# Patient Record
Sex: Female | Born: 1960 | Race: White | Hispanic: No | Marital: Single | Smoking: Former smoker
Health system: Southern US, Community
[De-identification: ages and names within clinical notes are randomized; demographics above are authoritative.]

## PROBLEM LIST (undated history)

## (undated) DIAGNOSIS — F3281 Premenstrual dysphoric disorder: Secondary | ICD-10-CM

## (undated) DIAGNOSIS — E079 Disorder of thyroid, unspecified: Secondary | ICD-10-CM

## (undated) DIAGNOSIS — D0510 Intraductal carcinoma in situ of unspecified breast: Secondary | ICD-10-CM

## (undated) DIAGNOSIS — B009 Herpesviral infection, unspecified: Secondary | ICD-10-CM

## (undated) DIAGNOSIS — I10 Essential (primary) hypertension: Secondary | ICD-10-CM

## (undated) DIAGNOSIS — F419 Anxiety disorder, unspecified: Secondary | ICD-10-CM

## (undated) DIAGNOSIS — M1990 Unspecified osteoarthritis, unspecified site: Secondary | ICD-10-CM

## (undated) DIAGNOSIS — K219 Gastro-esophageal reflux disease without esophagitis: Secondary | ICD-10-CM

## (undated) DIAGNOSIS — T7840XA Allergy, unspecified, initial encounter: Secondary | ICD-10-CM

## (undated) DIAGNOSIS — R7303 Prediabetes: Secondary | ICD-10-CM

## (undated) DIAGNOSIS — E119 Type 2 diabetes mellitus without complications: Secondary | ICD-10-CM

## (undated) DIAGNOSIS — E669 Obesity, unspecified: Secondary | ICD-10-CM

## (undated) DIAGNOSIS — D649 Anemia, unspecified: Secondary | ICD-10-CM

## (undated) DIAGNOSIS — G4730 Sleep apnea, unspecified: Secondary | ICD-10-CM

## (undated) DIAGNOSIS — G4733 Obstructive sleep apnea (adult) (pediatric): Secondary | ICD-10-CM

## (undated) DIAGNOSIS — E559 Vitamin D deficiency, unspecified: Secondary | ICD-10-CM

## (undated) DIAGNOSIS — C50919 Malignant neoplasm of unspecified site of unspecified female breast: Secondary | ICD-10-CM

## (undated) DIAGNOSIS — E039 Hypothyroidism, unspecified: Secondary | ICD-10-CM

## (undated) DIAGNOSIS — E785 Hyperlipidemia, unspecified: Secondary | ICD-10-CM

## (undated) DIAGNOSIS — K317 Polyp of stomach and duodenum: Secondary | ICD-10-CM

## (undated) HISTORY — DX: Premenstrual dysphoric disorder: F32.81

## (undated) HISTORY — DX: Malignant neoplasm of unspecified site of unspecified female breast: C50.919

## (undated) HISTORY — PX: LAPAROSCOPIC GASTRIC SLEEVE RESECTION: SHX5895

## (undated) HISTORY — DX: Hyperlipidemia, unspecified: E78.5

## (undated) HISTORY — DX: Essential (primary) hypertension: I10

## (undated) HISTORY — PX: KNEE ARTHROSCOPY: SUR90

## (undated) HISTORY — DX: Allergy, unspecified, initial encounter: T78.40XA

## (undated) HISTORY — DX: Disorder of thyroid, unspecified: E07.9

## (undated) HISTORY — PX: BREAST BIOPSY: SHX20

## (undated) HISTORY — DX: Type 2 diabetes mellitus without complications: E11.9

## (undated) HISTORY — DX: Obesity, unspecified: E66.9

## (undated) HISTORY — PX: CHOLECYSTECTOMY: SHX55

## (undated) HISTORY — PX: OTHER SURGICAL HISTORY: SHX169

## (undated) HISTORY — DX: Anxiety disorder, unspecified: F41.9

## (undated) HISTORY — PX: HERNIA REPAIR: SHX51

## (undated) HISTORY — DX: Herpesviral infection, unspecified: B00.9

## (undated) HISTORY — PX: BLADDER SURGERY: SHX569

## (undated) HISTORY — PX: WISDOM TOOTH EXTRACTION: SHX21

---

## 2004-06-10 ENCOUNTER — Ambulatory Visit: Payer: Self-pay | Admitting: Obstetrics and Gynecology

## 2005-03-10 ENCOUNTER — Ambulatory Visit: Payer: Self-pay

## 2005-06-02 ENCOUNTER — Ambulatory Visit: Payer: Self-pay | Admitting: Unknown Physician Specialty

## 2005-07-23 ENCOUNTER — Ambulatory Visit: Payer: Self-pay | Admitting: Obstetrics and Gynecology

## 2006-06-15 DIAGNOSIS — D239 Other benign neoplasm of skin, unspecified: Secondary | ICD-10-CM

## 2006-06-15 HISTORY — DX: Other benign neoplasm of skin, unspecified: D23.9

## 2006-07-27 ENCOUNTER — Ambulatory Visit: Payer: Self-pay | Admitting: Obstetrics and Gynecology

## 2007-02-08 ENCOUNTER — Ambulatory Visit: Payer: Self-pay

## 2007-08-10 ENCOUNTER — Ambulatory Visit: Payer: Self-pay | Admitting: Obstetrics and Gynecology

## 2009-09-25 ENCOUNTER — Ambulatory Visit: Payer: Self-pay | Admitting: Obstetrics and Gynecology

## 2009-10-06 ENCOUNTER — Ambulatory Visit: Payer: Self-pay | Admitting: Obstetrics and Gynecology

## 2009-10-10 ENCOUNTER — Ambulatory Visit: Payer: Self-pay | Admitting: Obstetrics and Gynecology

## 2011-10-04 ENCOUNTER — Ambulatory Visit: Payer: Self-pay | Admitting: Obstetrics and Gynecology

## 2012-02-02 ENCOUNTER — Ambulatory Visit: Payer: Self-pay | Admitting: Unknown Physician Specialty

## 2012-05-05 ENCOUNTER — Ambulatory Visit: Payer: Self-pay | Admitting: Unknown Physician Specialty

## 2012-10-06 ENCOUNTER — Ambulatory Visit: Payer: Self-pay

## 2012-10-23 ENCOUNTER — Ambulatory Visit: Payer: Self-pay

## 2012-12-01 ENCOUNTER — Ambulatory Visit: Payer: Self-pay

## 2012-12-23 ENCOUNTER — Ambulatory Visit: Payer: Self-pay

## 2013-01-17 ENCOUNTER — Ambulatory Visit: Payer: Self-pay | Admitting: Specialist

## 2013-01-22 ENCOUNTER — Ambulatory Visit: Payer: Self-pay

## 2013-02-08 ENCOUNTER — Telehealth: Payer: Self-pay | Admitting: *Deleted

## 2013-02-08 ENCOUNTER — Other Ambulatory Visit: Payer: Self-pay | Admitting: *Deleted

## 2013-02-08 MED ORDER — MELOXICAM 15 MG PO TABS: 15.0000 mg | ORAL_TABLET | Freq: Every day | ORAL | Status: DC

## 2013-02-08 NOTE — Telephone Encounter (Signed)
Fax request from pharmacy [facility] : mobic 15 mg #50 with 3 refills take one tablet by mouth every day

## 2013-02-08 NOTE — Telephone Encounter (Signed)
That will be fine for three additional refills.

## 2013-03-06 ENCOUNTER — Ambulatory Visit: Payer: Self-pay | Admitting: Gastroenterology

## 2013-03-08 LAB — PATHOLOGY REPORT

## 2013-04-12 ENCOUNTER — Other Ambulatory Visit: Payer: Self-pay | Admitting: *Deleted

## 2013-04-12 MED ORDER — MELOXICAM 15 MG PO TABS: 15.0000 mg | ORAL_TABLET | Freq: Every day | ORAL | Status: DC

## 2013-04-12 NOTE — Telephone Encounter (Signed)
Received fax refill request . Refilled meloxicam 15mg  #30 3rfs

## 2013-07-23 DIAGNOSIS — E039 Hypothyroidism, unspecified: Secondary | ICD-10-CM | POA: Insufficient documentation

## 2013-07-23 DIAGNOSIS — E785 Hyperlipidemia, unspecified: Secondary | ICD-10-CM | POA: Insufficient documentation

## 2013-07-23 DIAGNOSIS — I10 Essential (primary) hypertension: Secondary | ICD-10-CM | POA: Insufficient documentation

## 2013-07-23 DIAGNOSIS — E669 Obesity, unspecified: Secondary | ICD-10-CM | POA: Insufficient documentation

## 2013-07-31 ENCOUNTER — Other Ambulatory Visit: Payer: Self-pay | Admitting: *Deleted

## 2013-07-31 MED ORDER — MELOXICAM 15 MG PO TABS: 15.0000 mg | ORAL_TABLET | Freq: Every day | ORAL | Status: DC

## 2013-07-31 NOTE — Telephone Encounter (Signed)
[facility] sent refill request for meloxicam 15 mg #30 with 3 refills. Refill per dr Milinda Pointer.

## 2013-11-01 ENCOUNTER — Ambulatory Visit: Payer: Self-pay

## 2013-11-01 LAB — HM COLONOSCOPY

## 2014-10-09 ENCOUNTER — Ambulatory Visit (INDEPENDENT_AMBULATORY_CARE_PROVIDER_SITE_OTHER): Payer: 59 | Admitting: Podiatry

## 2014-10-09 ENCOUNTER — Ambulatory Visit (INDEPENDENT_AMBULATORY_CARE_PROVIDER_SITE_OTHER): Payer: 59

## 2014-10-09 DIAGNOSIS — M722 Plantar fascial fibromatosis: Secondary | ICD-10-CM

## 2014-10-09 NOTE — Progress Notes (Signed)
She presents today with a chief complaint of pain to the dorsal lateral aspect of her left foot. He states that the plantar fasciitis seems to have resolved over the past several years. She states that the pain to the lateral aspect of the foot is sharp and seems to originate from this joint as she points to the fifth metatarsal cuboid articulation site. States that the pain is sharp in nature and is persistent. She's tried nothing to accommodate this pain.  Objective: Vital signs are stable she is alert and oriented 3. Pulses are strongly palpable. Neurologic sensorium is intact persons once the monofilament. Deep tendon reflexes are intact bilateral muscle strength +5 over 5 dorsiflexion plantar flexors and inverters everters all intrinsic musculature is intact. Orthopedic evaluation of his resolved joints distal to the ankle while a full range of motion. 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  Plan: We discussed the etiology pathology conservative versus surgical therapies. We discussed appropriate shoe gear stretching exercises ice therapy issue modifications. Started her utilizing a plantar fascial brace and a injection with Kenalog and local anesthesia. I will follow-up with her in a month if necessary.

## 2014-10-09 NOTE — Addendum Note (Signed)
Addended by: Clovis Riley E on: 10/09/2014 01:42 PM   Modules accepted: Medications

## 2014-10-31 ENCOUNTER — Telehealth: Payer: Self-pay | Admitting: Unknown Physician Specialty

## 2014-10-31 NOTE — Telephone Encounter (Signed)
Pt came in to get labs done and i advised her to go to a drawing station because she had orders from another provider. She would like to know if she needed to set up an appt to come in to go over labs.

## 2014-10-31 NOTE — Telephone Encounter (Signed)
Yes, she should make an appointment to go over labs.  Thanks.

## 2014-11-04 NOTE — Telephone Encounter (Signed)
Called pt left voicemail to return call and schedule appointment. Will update once appt is made. Thanks.

## 2014-11-05 DIAGNOSIS — B009 Herpesviral infection, unspecified: Secondary | ICD-10-CM

## 2014-11-05 DIAGNOSIS — F3281 Premenstrual dysphoric disorder: Secondary | ICD-10-CM | POA: Insufficient documentation

## 2014-11-05 DIAGNOSIS — F329 Major depressive disorder, single episode, unspecified: Secondary | ICD-10-CM

## 2014-11-05 DIAGNOSIS — F32A Depression, unspecified: Secondary | ICD-10-CM

## 2014-11-05 DIAGNOSIS — E559 Vitamin D deficiency, unspecified: Secondary | ICD-10-CM

## 2014-11-05 DIAGNOSIS — F419 Anxiety disorder, unspecified: Secondary | ICD-10-CM | POA: Insufficient documentation

## 2014-11-05 DIAGNOSIS — J309 Allergic rhinitis, unspecified: Secondary | ICD-10-CM

## 2014-11-05 DIAGNOSIS — E668 Other obesity: Secondary | ICD-10-CM

## 2014-11-05 DIAGNOSIS — E118 Type 2 diabetes mellitus with unspecified complications: Secondary | ICD-10-CM

## 2014-11-05 DIAGNOSIS — F39 Unspecified mood [affective] disorder: Secondary | ICD-10-CM | POA: Insufficient documentation

## 2014-11-05 DIAGNOSIS — E119 Type 2 diabetes mellitus without complications: Secondary | ICD-10-CM | POA: Insufficient documentation

## 2014-11-05 DIAGNOSIS — E669 Obesity, unspecified: Secondary | ICD-10-CM | POA: Insufficient documentation

## 2014-11-05 NOTE — Telephone Encounter (Signed)
Called and left patient a voicemail asking for her to return my call and schedule a follow-up visit.

## 2014-11-05 NOTE — Telephone Encounter (Signed)
I was abstracting charts and noticed that this patient has an appointment scheduled for 11/12/14.

## 2014-11-12 ENCOUNTER — Ambulatory Visit: Payer: Self-pay | Admitting: Unknown Physician Specialty

## 2014-11-13 ENCOUNTER — Ambulatory Visit: Payer: 59 | Admitting: Podiatry

## 2014-11-25 ENCOUNTER — Encounter: Payer: Self-pay | Admitting: Unknown Physician Specialty

## 2014-11-25 ENCOUNTER — Ambulatory Visit (INDEPENDENT_AMBULATORY_CARE_PROVIDER_SITE_OTHER): Payer: 59 | Admitting: Unknown Physician Specialty

## 2014-11-25 ENCOUNTER — Other Ambulatory Visit: Payer: Self-pay | Admitting: Obstetrics and Gynecology

## 2014-11-25 VITALS — BP 117/78 | HR 67 | Temp 98.3°F | Ht 65.3 in | Wt 180.4 lb

## 2014-11-25 DIAGNOSIS — Z23 Encounter for immunization: Secondary | ICD-10-CM | POA: Diagnosis not present

## 2014-11-25 DIAGNOSIS — R7301 Impaired fasting glucose: Secondary | ICD-10-CM | POA: Diagnosis not present

## 2014-11-25 DIAGNOSIS — F39 Unspecified mood [affective] disorder: Secondary | ICD-10-CM | POA: Diagnosis not present

## 2014-11-25 DIAGNOSIS — E785 Hyperlipidemia, unspecified: Secondary | ICD-10-CM

## 2014-11-25 DIAGNOSIS — Z79890 Hormone replacement therapy (postmenopausal): Secondary | ICD-10-CM | POA: Insufficient documentation

## 2014-11-25 DIAGNOSIS — Z1231 Encounter for screening mammogram for malignant neoplasm of breast: Secondary | ICD-10-CM

## 2014-11-25 DIAGNOSIS — E039 Hypothyroidism, unspecified: Secondary | ICD-10-CM | POA: Diagnosis not present

## 2014-11-25 LAB — LIPID PANEL PICCOLO, WAIVED
Chol/HDL Ratio Piccolo,Waive: 3.4 mg/dL
Cholesterol Piccolo, Waived: 278 mg/dL — ABNORMAL HIGH (ref <200)
HDL CHOL PICCOLO, WAIVED: 82 mg/dL (ref >59)
LDL Chol Calc Piccolo Waived: 156 mg/dL — ABNORMAL HIGH (ref <100)
TRIGLYCERIDES PICCOLO,WAIVED: 197 mg/dL — AB (ref <150)
VLDL Chol Calc Piccolo,Waive: 39 mg/dL — ABNORMAL HIGH (ref <30)

## 2014-11-25 NOTE — Assessment & Plan Note (Signed)
LDL is 156.  Not in statin benefit group

## 2014-11-25 NOTE — Patient Instructions (Signed)

## 2014-11-25 NOTE — Progress Notes (Signed)
BP 117/78 mmHg  Pulse 67  Temp(Src) 98.3 F (36.8 C)  Ht 5' 5.3" (1.659 m)  Wt 180 lb 6.4 oz (81.829 kg)  BMI 29.73 kg/m2  SpO2 97%  LMP  (LMP Unknown)   Subjective:    Patient ID: Dominique Kerr, female    DOB: [birth year], 53 y.o.   MRN: [id number]  HPI: Dominique Kerr is a 54 y.o. female  Chief Complaint  Patient presents with  . other    pt states she is here to go over lab work that was done outside of our office at a drawing station   IFG Hgb A1C of 5.8.  She was 6.1 previously.  She "has been a little slack" but getting back into walking and to the gym.    Vitamin D Pt is slightly low at 26.2.  Restarted taking vitamin D3 Supplements.    Hyperlipidemia High last visit.  Did not have that rechecked   Hypothyroid Euthyroid on last lab work on Levothyroxine 175 mcgs.  Denies any problem.   Depression Good control Depression screen PHQ 2/9 11/25/2014  Decreased Interest 0  Down, Depressed, Hopeless 0  PHQ - 2 Score 0      Relevant past medical, surgical, family and social history reviewed and updated as indicated. Interim medical history since our last visit reviewed. Allergies and medications reviewed and updated.  Review of Systems  Per HPI unless specifically indicated above     Objective:    BP 117/78 mmHg  Pulse 67  Temp(Src) 98.3 F (36.8 C)  Ht 5' 5.3" (1.659 m)  Wt 180 lb 6.4 oz (81.829 kg)  BMI 29.73 kg/m2  SpO2 97%  LMP  (LMP Unknown)  Wt Readings from Last 3 Encounters:  11/25/14 180 lb 6.4 oz (81.829 kg)  05/20/14 180 lb (81.647 kg)    Physical Exam  Constitutional: She is oriented to person, place, and time. She appears well-developed and well-nourished. No distress.  HENT:  Head: Normocephalic and atraumatic.  Eyes: Conjunctivae and lids are normal. Right eye exhibits no discharge. Left eye exhibits no discharge. No scleral icterus.  Cardiovascular: Normal rate, regular rhythm and normal heart sounds.    Pulmonary/Chest: Effort normal and breath sounds normal. No respiratory distress.  Abdominal: Normal appearance. There is no splenomegaly or hepatomegaly.  Musculoskeletal: Normal range of motion.  Neurological: She is alert and oriented to person, place, and time.  Skin: Skin is intact. No rash noted. No pallor.  Psychiatric: She has a normal mood and affect. Her behavior is normal. Judgment and thought content normal.       Assessment & Plan:   Problem List Items Addressed This Visit      Unprioritized   HLD (hyperlipidemia)    LDL is 156.  Not in statin benefit group      Relevant Orders   Lipid Panel Piccolo, Waived   Adult hypothyroidism    Euthyroid. Continue present medications.       Mood disorder (HCC)    Good control      IFG (impaired fasting glucose)    Continue with current diet and exercise       Other Visit Diagnoses    Immunization due    -  Primary    Relevant Orders    Flu Vaccine QUAD 36+ mos PF IM (Fluarix & Fluzone Quad PF) (Completed)        Follow up plan: Return in about 1 year (around 11/25/2015).

## 2014-11-25 NOTE — Assessment & Plan Note (Signed)
Continue with current diet and exercise  

## 2014-11-25 NOTE — Assessment & Plan Note (Signed)
Good control

## 2014-11-25 NOTE — Assessment & Plan Note (Signed)
Euthyroid.  Continue present medications 

## 2014-11-28 ENCOUNTER — Ambulatory Visit: Payer: Self-pay

## 2014-12-09 ENCOUNTER — Other Ambulatory Visit: Payer: Self-pay

## 2014-12-09 MED ORDER — LEVOTHYROXINE SODIUM 175 MCG PO TABS
175.0000 ug | ORAL_TABLET | Freq: Every day | ORAL | Status: DC
Start: 2014-12-09 — End: 2015-09-02

## 2014-12-09 NOTE — Telephone Encounter (Signed)
PATIENT: Dominique Kerr DOB: 1960/06/08 LAST VISIT: 11/25/2014  Patient requests synthroid 175 mcg tab

## 2015-01-09 ENCOUNTER — Ambulatory Visit
Admission: RE | Admit: 2015-01-09 | Discharge: 2015-01-09 | Disposition: A | Discharge status: Home / Self Care | Payer: 59 | Source: Ambulatory Visit | Attending: Obstetrics and Gynecology | Admitting: Obstetrics and Gynecology

## 2015-01-09 DIAGNOSIS — Z1231 Encounter for screening mammogram for malignant neoplasm of breast: Secondary | ICD-10-CM | POA: Diagnosis not present

## 2015-01-25 ENCOUNTER — Other Ambulatory Visit: Payer: Self-pay | Admitting: Unknown Physician Specialty

## 2015-02-22 ENCOUNTER — Other Ambulatory Visit: Payer: Self-pay | Admitting: Unknown Physician Specialty

## 2015-08-20 ENCOUNTER — Encounter: Payer: Self-pay | Admitting: Podiatry

## 2015-08-20 ENCOUNTER — Ambulatory Visit (INDEPENDENT_AMBULATORY_CARE_PROVIDER_SITE_OTHER): Payer: 59 | Admitting: Podiatry

## 2015-08-20 DIAGNOSIS — M722 Plantar fascial fibromatosis: Secondary | ICD-10-CM

## 2015-08-20 MED ORDER — MELOXICAM 15 MG PO TABS: 15.0000 mg | ORAL_TABLET | Freq: Every day | ORAL | Status: DC

## 2015-08-20 NOTE — Progress Notes (Signed)
She presents today with chief complaint of painful left heel. She states that she continues to wear her tennis shoes to work and she continues to utilize her plantar fascia brace.  Objective: Vital signs are stable she is alert and oriented 3. Pulses are palpable. She has pain on palpation medial tubercle of the left heel.  Assessment: Chronic intractable plantar fasciitis left heel.  Plan: Injected the heel today with Kenalog and local anesthetic provided her with a prescription for meloxicam 15 mg 1 by mouth daily 30 with 3 refills and I also recommended tissues to be worn at work at all times. No was provided for such today and I will follow-up with her as needed.

## 2015-09-02 ENCOUNTER — Other Ambulatory Visit: Payer: Self-pay | Admitting: Unknown Physician Specialty

## 2015-11-28 ENCOUNTER — Encounter: Payer: Self-pay | Admitting: Unknown Physician Specialty

## 2015-11-28 ENCOUNTER — Ambulatory Visit (INDEPENDENT_AMBULATORY_CARE_PROVIDER_SITE_OTHER): Payer: 59 | Admitting: Unknown Physician Specialty

## 2015-11-28 VITALS — BP 139/80 | HR 67 | Temp 98.4°F | Ht 65.7 in | Wt 197.0 lb

## 2015-11-28 DIAGNOSIS — Z0000 Encounter for general adult medical examination without abnormal findings: Secondary | ICD-10-CM

## 2015-11-28 DIAGNOSIS — E784 Other hyperlipidemia: Secondary | ICD-10-CM | POA: Diagnosis not present

## 2015-11-28 DIAGNOSIS — E559 Vitamin D deficiency, unspecified: Secondary | ICD-10-CM

## 2015-11-28 DIAGNOSIS — R7301 Impaired fasting glucose: Secondary | ICD-10-CM | POA: Diagnosis not present

## 2015-11-28 DIAGNOSIS — E039 Hypothyroidism, unspecified: Secondary | ICD-10-CM

## 2015-11-28 DIAGNOSIS — Z23 Encounter for immunization: Secondary | ICD-10-CM | POA: Diagnosis not present

## 2015-11-28 DIAGNOSIS — E7849 Other hyperlipidemia: Secondary | ICD-10-CM

## 2015-11-28 LAB — BAYER DCA HB A1C WAIVED: HB A1C (BAYER DCA - WAIVED): 5.9 % (ref <7.0)

## 2015-11-28 NOTE — Assessment & Plan Note (Signed)
Check Hgb A1C 

## 2015-11-28 NOTE — Patient Instructions (Signed)

## 2015-11-28 NOTE — Assessment & Plan Note (Signed)
Check cholesterol today 

## 2015-11-28 NOTE — Assessment & Plan Note (Signed)
Monitor TSH 

## 2015-11-28 NOTE — Progress Notes (Signed)
BP 139/80 (BP Location: Left Arm, Patient Position: Sitting, Cuff Size: Large)   Pulse 67   Temp 98.4 F (36.9 C)   Ht 5' 5.7" (1.669 m)   Wt 197 lb (89.4 kg)   LMP  (LMP Unknown)   SpO2 97%   BMI 32.09 kg/m    Subjective:    Patient ID: Dominique Kerr, female    DOB: Jun 02, 1960, 55 y.o.   MRN: [id number]  HPI: Dominique Kerr is a 55 y.o. female  Chief Complaint  Patient presents with  . Annual Exam   Hypothyroid Pt has had some weight gain.  Sometimes dizzy.  Sometimes decreased energy  Depression No real change in mood. Depression screen [facility] 2/9 11/28/2015 11/25/2014  Decreased Interest 0 0  Down, Depressed, Hopeless 0 0  PHQ - 2 Score 0 0  Altered sleeping 0 -  Tired, decreased energy 0 -  Change in appetite 0 -  Feeling bad or failure about yourself  0 -  Trouble concentrating 0 -  Moving slowly or fidgety/restless 0 -  Suicidal thoughts 0 -  PHQ-9 Score 0 -   Social History   Social History  . Marital status: Single    Spouse name: N/A  . Number of children: N/A  . Years of education: N/A   Occupational History  . Not on file.   Social History Main Topics  . Smoking status: Former Research scientist (life sciences)  . Smokeless tobacco: Never Used  . Alcohol use 0.0 oz/week     Comment: on occasion  . Drug use: No  . Sexual activity: No   Other Topics Concern  . Not on file   Social History Narrative  . No narrative on file   Family History  Problem Relation Age of Onset  . Diabetes Mother   . Hepatitis C Mother   . Arthritis Sister   . Diabetes Maternal Grandmother    Past Medical History:  Diagnosis Date  . Allergy   . Anxiety   . Diabetes mellitus without complication ([place])   . HSV (herpes simplex virus) infection   . Hyperlipidemia   . Hypertension   . Obesity   . PMDD (premenstrual dysphoric disorder)   . Thyroid disease    Past Surgical History:  Procedure Laterality Date  . CHOLECYSTECTOMY    . KNEE ARTHROSCOPY    . LAPAROSCOPIC  GASTRIC SLEEVE RESECTION    . parotid gland removal          Relevant past medical, surgical, family and social history reviewed and updated as indicated. Interim medical history since our last visit reviewed. Allergies and medications reviewed and updated.  Review of Systems  Constitutional: Negative.   HENT: Negative.   Respiratory: Negative.   Cardiovascular: Negative.   Gastrointestinal: Negative.   Endocrine: Negative.   Genitourinary: Negative.   Musculoskeletal: Negative.   Allergic/Immunologic: Negative.   Neurological: Positive for dizziness.       Dizzyness comes and goes  Hematological: Negative.   Psychiatric/Behavioral: Negative.     Per HPI unless specifically indicated above     Objective:    BP 139/80 (BP Location: Left Arm, Patient Position: Sitting, Cuff Size: Large)   Pulse 67   Temp 98.4 F (36.9 C)   Ht 5' 5.7" (1.669 m)   Wt 197 lb (89.4 kg)   LMP  (LMP Unknown)   SpO2 97%   BMI 32.09 kg/m   Wt Readings from Last 3 Encounters:  11/28/15 197 lb (89.4 kg)  11/25/14 180 lb 6.4 oz (81.8 kg)  05/20/14 180 lb (81.6 kg)    Physical Exam  Constitutional: She is oriented to person, place, and time. She appears well-developed and well-nourished.  HENT:  Head: Normocephalic and atraumatic.  Eyes: Pupils are equal, round, and reactive to light. Right eye exhibits no discharge. Left eye exhibits no discharge. No scleral icterus.  Neck: Normal range of motion. Neck supple. Carotid bruit is not present. No thyromegaly present.  Cardiovascular: Normal rate, regular rhythm and normal heart sounds.  Exam reveals no gallop and no friction rub.   No murmur heard. Pulmonary/Chest: Effort normal and breath sounds normal. No respiratory distress. She has no wheezes. She has no rales.  Abdominal: Soft. Bowel sounds are normal. There is no tenderness. There is no rebound.  Genitourinary: No breast swelling, tenderness or discharge.  Musculoskeletal: Normal range  of motion.  Lymphadenopathy:    She has no cervical adenopathy.  Neurological: She is alert and oriented to person, place, and time.  Skin: Skin is warm, dry and intact. No rash noted.  Psychiatric: She has a normal mood and affect. Her speech is normal and behavior is normal. Judgment and thought content normal. Cognition and memory are normal.    Results for orders placed or performed in visit on 11/25/14  Lipid Panel Piccolo, [employer]  Result Value Ref Range   Cholesterol Piccolo, Waived 278 (H) <200 mg/dL   HDL Chol Piccolo, Waived 82 >59 mg/dL   Triglycerides Piccolo,Waived 197 (H) <150 mg/dL   Chol/HDL Ratio Piccolo,Waive 3.4 mg/dL   LDL Chol Calc Piccolo Waived 156 (H) <100 mg/dL   VLDL Chol Calc Piccolo,Waive 39 (H) <30 mg/dL      Assessment & Plan:   Problem List Items Addressed This Visit      Unprioritized   Adult hypothyroidism    Monitor TSH      Relevant Orders   TSH   HLD (hyperlipidemia)    Check cholesterol today      Relevant Orders   Lipid Panel w/o Chol/HDL Ratio   IFG (impaired fasting glucose)    Check Hgb A1C      Relevant Orders   Bayer DCA Hb A1c Waived   Vitamin D deficiency   Relevant Orders   VITAMIN D 25 Hydroxy (Vit-D Deficiency, Fractures)    Other Visit Diagnoses    Need for influenza vaccination    -  Primary   Relevant Orders   Flu Vaccine QUAD 36+ mos IM   Routine general medical examination at a health care facility       Relevant Orders   Comprehensive metabolic panel   CBC with Differential/Platelet   VITAMIN D 25 Hydroxy (Vit-D Deficiency, Fractures)       Follow up plan: Return in about 6 months (around 05/28/2016).

## 2015-11-29 LAB — CBC WITH DIFFERENTIAL/PLATELET
BASOS ABS: 0 10*3/uL (ref 0.0–0.2)
Basos: 1 %
EOS (ABSOLUTE): 0.1 10*3/uL (ref 0.0–0.4)
Eos: 2 %
HEMOGLOBIN: 12.3 g/dL (ref 11.1–15.9)
Hematocrit: 38.1 % (ref 34.0–46.6)
IMMATURE GRANS (ABS): 0 10*3/uL (ref 0.0–0.1)
IMMATURE GRANULOCYTES: 0 %
LYMPHS: 34 %
Lymphocytes Absolute: 1.4 10*3/uL (ref 0.7–3.1)
MCH: 27.8 pg (ref 26.6–33.0)
MCHC: 32.3 g/dL (ref 31.5–35.7)
MCV: 86 fL (ref 79–97)
MONOCYTES: 7 %
Monocytes Absolute: 0.3 10*3/uL (ref 0.1–0.9)
NEUTROS PCT: 56 %
Neutrophils Absolute: 2.4 10*3/uL (ref 1.4–7.0)
PLATELETS: 271 10*3/uL (ref 150–379)
RBC: 4.42 x10E6/uL (ref 3.77–5.28)
RDW: 14.2 % (ref 12.3–15.4)
WBC: 4.2 10*3/uL (ref 3.4–10.8)

## 2015-11-29 LAB — LIPID PANEL W/O CHOL/HDL RATIO
CHOLESTEROL TOTAL: 304 mg/dL — AB (ref 100–199)
HDL: 92 mg/dL (ref >39)
LDL Calculated: 191 mg/dL — ABNORMAL HIGH (ref 0–99)
TRIGLYCERIDES: 105 mg/dL (ref 0–149)
VLDL CHOLESTEROL CAL: 21 mg/dL (ref 5–40)

## 2015-11-29 LAB — COMPREHENSIVE METABOLIC PANEL
A/G RATIO: 1.6 (ref 1.2–2.2)
ALT: 16 IU/L (ref 0–32)
AST: 20 IU/L (ref 0–40)
Albumin: 4.3 g/dL (ref 3.5–5.5)
Alkaline Phosphatase: 74 IU/L (ref 39–117)
BILIRUBIN TOTAL: 0.3 mg/dL (ref 0.0–1.2)
BUN/Creatinine Ratio: 21 (ref 9–23)
BUN: 15 mg/dL (ref 6–24)
CHLORIDE: 101 mmol/L (ref 96–106)
CO2: 29 mmol/L (ref 18–29)
Calcium: 9.3 mg/dL (ref 8.7–10.2)
Creatinine, Ser: 0.72 mg/dL (ref 0.57–1.00)
GFR calc Af Amer: 111 mL/min/{1.73_m2} (ref >59)
GFR calc non Af Amer: 96 mL/min/{1.73_m2} (ref >59)
GLUCOSE: 91 mg/dL (ref 65–99)
Globulin, Total: 2.7 g/dL (ref 1.5–4.5)
POTASSIUM: 4.3 mmol/L (ref 3.5–5.2)
Sodium: 141 mmol/L (ref 134–144)
TOTAL PROTEIN: 7 g/dL (ref 6.0–8.5)

## 2015-11-29 LAB — TSH: TSH: 2.14 u[IU]/mL (ref 0.450–4.500)

## 2015-11-29 LAB — VITAMIN D 25 HYDROXY (VIT D DEFICIENCY, FRACTURES): Vit D, 25-Hydroxy: 27.5 ng/mL — ABNORMAL LOW (ref 30.0–100.0)

## 2015-12-01 ENCOUNTER — Telehealth: Payer: Self-pay | Admitting: Unknown Physician Specialty

## 2015-12-01 NOTE — Progress Notes (Signed)
Normal labs.  Pt notified by phone

## 2015-12-01 NOTE — Telephone Encounter (Signed)
Discussed cholesterol results and pt wants to work on lifestyle changes.    [name], can you give her a call and help her with mychart?

## 2015-12-03 ENCOUNTER — Other Ambulatory Visit: Payer: Self-pay | Admitting: Obstetrics and Gynecology

## 2015-12-03 DIAGNOSIS — N6459 Other signs and symptoms in breast: Secondary | ICD-10-CM

## 2015-12-10 ENCOUNTER — Ambulatory Visit
Admission: RE | Admit: 2015-12-10 | Discharge: 2015-12-10 | Disposition: A | Discharge status: Home / Self Care | Payer: 59 | Source: Ambulatory Visit | Attending: Obstetrics and Gynecology | Admitting: Obstetrics and Gynecology

## 2015-12-10 DIAGNOSIS — N6459 Other signs and symptoms in breast: Secondary | ICD-10-CM

## 2016-01-25 ENCOUNTER — Other Ambulatory Visit: Payer: Self-pay | Admitting: Unknown Physician Specialty

## 2016-02-02 ENCOUNTER — Other Ambulatory Visit: Payer: Self-pay | Admitting: Unknown Physician Specialty

## 2016-05-16 ENCOUNTER — Other Ambulatory Visit: Payer: Self-pay | Admitting: Unknown Physician Specialty

## 2016-05-28 ENCOUNTER — Encounter: Payer: Self-pay | Admitting: Unknown Physician Specialty

## 2016-05-28 ENCOUNTER — Ambulatory Visit (INDEPENDENT_AMBULATORY_CARE_PROVIDER_SITE_OTHER): Payer: 59 | Admitting: Unknown Physician Specialty

## 2016-05-28 VITALS — BP 135/78 | HR 69 | Temp 98.5°F | Ht 65.5 in | Wt 190.7 lb

## 2016-05-28 DIAGNOSIS — F329 Major depressive disorder, single episode, unspecified: Secondary | ICD-10-CM

## 2016-05-28 DIAGNOSIS — F32A Depression, unspecified: Secondary | ICD-10-CM

## 2016-05-28 DIAGNOSIS — E039 Hypothyroidism, unspecified: Secondary | ICD-10-CM

## 2016-05-28 DIAGNOSIS — F418 Other specified anxiety disorders: Secondary | ICD-10-CM | POA: Diagnosis not present

## 2016-05-28 DIAGNOSIS — E7800 Pure hypercholesterolemia, unspecified: Secondary | ICD-10-CM | POA: Diagnosis not present

## 2016-05-28 DIAGNOSIS — R7301 Impaired fasting glucose: Secondary | ICD-10-CM | POA: Diagnosis not present

## 2016-05-28 DIAGNOSIS — F419 Anxiety disorder, unspecified: Secondary | ICD-10-CM

## 2016-05-28 LAB — LIPID PANEL PICCOLO, WAIVED
Chol/HDL Ratio Piccolo,Waive: 3.4 mg/dL
Cholesterol Piccolo, Waived: 269 mg/dL — ABNORMAL HIGH (ref <200)
HDL Chol Piccolo, Waived: 80 mg/dL (ref >59)
LDL Chol Calc Piccolo Waived: 167 mg/dL — ABNORMAL HIGH (ref <100)
Triglycerides Piccolo,Waived: 111 mg/dL (ref <150)
VLDL CHOL CALC PICCOLO,WAIVE: 22 mg/dL (ref <30)

## 2016-05-28 LAB — GLUCOSE HEMOCUE WAIVED: GLU HEMOCUE WAIVED: 83 mg/dL (ref 65–99)

## 2016-05-28 NOTE — Progress Notes (Signed)
BP 135/78 (BP Location: Left Arm, Patient Position: Sitting, Cuff Size: Large)   Pulse 69   Temp 98.5 F (36.9 C)   Ht 5' 5.5" (1.664 m)   Wt 190 lb 11.2 oz (86.5 kg)   SpO2 98%   BMI 31.25 kg/m    Subjective:    Patient ID: Dominique Kerr, female    DOB: 29-Dec-1960, 56 y.o.   MRN: [id number]  HPI: Dominique Kerr is a 56 y.o. female  Chief Complaint  Patient presents with  . Hypothyroidism  . Depression   Hypercholesterolemia Pt states she started back at the gym this week.  She joined [employer] watcher andhas lost some weight since last visit.     Hypothyroid Euthyroid last visit.     GERD Reflux is stable on present treatment  Depression Doing well with present treatment of Sertraline 100 mg daily Depression screen [facility] 2/9 05/28/2016 11/28/2015 11/25/2014  Decreased Interest 0 0 0  Down, Depressed, Hopeless 0 0 0  PHQ - 2 Score 0 0 0  Altered sleeping - 0 -  Tired, decreased energy - 0 -  Change in appetite - 0 -  Feeling bad or failure about yourself  - 0 -  Trouble concentrating - 0 -  Moving slowly or fidgety/restless - 0 -  Suicidal thoughts - 0 -  PHQ-9 Score - 0 -   Relevant past medical, surgical, family and social history reviewed and updated as indicated. Interim medical history since our last visit reviewed. Allergies and medications reviewed and updated.  Review of Systems  Per HPI unless specifically indicated above     Objective:    BP 135/78 (BP Location: Left Arm, Patient Position: Sitting, Cuff Size: Large)   Pulse 69   Temp 98.5 F (36.9 C)   Ht 5' 5.5" (1.664 m)   Wt 190 lb 11.2 oz (86.5 kg)   SpO2 98%   BMI 31.25 kg/m   Wt Readings from Last 3 Encounters:  05/28/16 190 lb 11.2 oz (86.5 kg)  11/28/15 197 lb (89.4 kg)  11/25/14 180 lb 6.4 oz (81.8 kg)    Physical Exam  Constitutional: She is oriented to person, place, and time. She appears well-developed and well-nourished. No distress.  HENT:  Head: Normocephalic and  atraumatic.  Eyes: Conjunctivae and lids are normal. Right eye exhibits no discharge. Left eye exhibits no discharge. No scleral icterus.  Neck: Normal range of motion. Neck supple. No JVD present. Carotid bruit is not present.  Cardiovascular: Normal rate, regular rhythm and normal heart sounds.   Pulmonary/Chest: Effort normal and breath sounds normal.  Abdominal: Normal appearance. There is no splenomegaly or hepatomegaly.  Musculoskeletal: Normal range of motion.  Neurological: She is alert and oriented to person, place, and time.  Skin: Skin is warm, dry and intact. No rash noted. No pallor.  Psychiatric: She has a normal mood and affect. Her behavior is normal. Judgment and thought content normal.     Glucose 83 LDL is 167 Assessment & Plan:   Problem List Items Addressed This Visit      Unprioritized   Adult hypothyroidism    Euthryoid.  Continue present medication      Anxiety and depression    Stable, continue present medications.        HLD (hyperlipidemia)    This is improved with LDL of 167 down from 190.  ASCVD calculator not in statin benefit group.  She will continue with working on diet and exercise  Relevant Orders   Lipid Panel Piccolo, Waived   IFG (impaired fasting glucose) - Primary    Glucose 83      Relevant Orders   Glucose Hemocue Waived       Follow up plan: Return in about 6 months (around 11/27/2016) for physical.

## 2016-05-28 NOTE — Assessment & Plan Note (Signed)
This is improved with LDL of 167 down from 190.  ASCVD calculator not in statin benefit group.  She will continue with working on diet and exercise

## 2016-05-28 NOTE — Assessment & Plan Note (Signed)
Stable, continue present medications.   

## 2016-05-28 NOTE — Assessment & Plan Note (Signed)
Glucose 83

## 2016-05-28 NOTE — Assessment & Plan Note (Signed)
Euthryoid.  Continue present medication

## 2016-12-01 ENCOUNTER — Encounter: Payer: 59 | Admitting: Unknown Physician Specialty

## 2017-01-11 ENCOUNTER — Other Ambulatory Visit: Payer: Self-pay | Admitting: Obstetrics and Gynecology

## 2017-01-11 DIAGNOSIS — Z1231 Encounter for screening mammogram for malignant neoplasm of breast: Secondary | ICD-10-CM

## 2017-01-19 ENCOUNTER — Encounter: Payer: 59 | Admitting: Unknown Physician Specialty

## 2017-01-24 ENCOUNTER — Other Ambulatory Visit: Payer: Self-pay | Admitting: Family Medicine

## 2017-02-06 ENCOUNTER — Other Ambulatory Visit: Payer: Self-pay | Admitting: Unknown Physician Specialty

## 2017-02-07 ENCOUNTER — Telehealth: Payer: Self-pay | Admitting: Unknown Physician Specialty

## 2017-02-07 NOTE — Telephone Encounter (Unsigned)
Copied from [place] [id number]. Topic: Quick Communication - See Telephone Encounter >> Feb 07, 2017  3:51 PM Percell Belt A wrote: CRM for notification. See Telephone encounter for: pt called in and said that pharmacy supposedly has faxed over  sertraline (ZOLOFT) 100 MG tablet [[id number]] 3 times for refill.  I did not see that it was e scribed.  Pt is requesting refill.  [facility] [place]   02/07/17.

## 2017-02-08 NOTE — Telephone Encounter (Signed)
Called [facility], gave verba; for dosage and dose written. Called pt and left message.

## 2017-02-09 ENCOUNTER — Ambulatory Visit
Admission: RE | Admit: 2017-02-09 | Discharge: 2017-02-09 | Disposition: A | Discharge status: Home / Self Care | Payer: 59 | Source: Ambulatory Visit | Attending: Obstetrics and Gynecology | Admitting: Obstetrics and Gynecology

## 2017-02-09 ENCOUNTER — Other Ambulatory Visit: Payer: Self-pay

## 2017-02-09 DIAGNOSIS — Z1231 Encounter for screening mammogram for malignant neoplasm of breast: Secondary | ICD-10-CM | POA: Diagnosis not present

## 2017-02-09 MED ORDER — SERTRALINE HCL 100 MG PO TABS: 100.0000 mg | ORAL_TABLET | Freq: Every day | ORAL | 3 refills | Status: DC

## 2017-02-09 NOTE — Telephone Encounter (Signed)
Patient last seen 05/2016 and no f/up scheduled. Pharmacy states insurance requires 90 day RX.

## 2017-02-09 NOTE — Telephone Encounter (Signed)
Recieved fax from [facility] requesting refill on Sertraline HCL 100 mg tablet quantity 90.

## 2017-05-16 DIAGNOSIS — M25561 Pain in right knee: Secondary | ICD-10-CM | POA: Diagnosis not present

## 2017-05-16 DIAGNOSIS — M1711 Unilateral primary osteoarthritis, right knee: Secondary | ICD-10-CM | POA: Diagnosis not present

## 2017-07-13 ENCOUNTER — Encounter: Payer: Self-pay | Admitting: Unknown Physician Specialty

## 2017-07-13 ENCOUNTER — Ambulatory Visit (INDEPENDENT_AMBULATORY_CARE_PROVIDER_SITE_OTHER): Payer: BLUE CROSS/BLUE SHIELD | Admitting: Unknown Physician Specialty

## 2017-07-13 ENCOUNTER — Encounter

## 2017-07-13 VITALS — BP 125/80 | HR 67 | Temp 97.9°F | Ht 65.7 in | Wt 203.0 lb

## 2017-07-13 DIAGNOSIS — F329 Major depressive disorder, single episode, unspecified: Secondary | ICD-10-CM | POA: Diagnosis not present

## 2017-07-13 DIAGNOSIS — E039 Hypothyroidism, unspecified: Secondary | ICD-10-CM

## 2017-07-13 DIAGNOSIS — E7800 Pure hypercholesterolemia, unspecified: Secondary | ICD-10-CM | POA: Diagnosis not present

## 2017-07-13 DIAGNOSIS — F419 Anxiety disorder, unspecified: Secondary | ICD-10-CM

## 2017-07-13 DIAGNOSIS — Z0000 Encounter for general adult medical examination without abnormal findings: Secondary | ICD-10-CM

## 2017-07-13 DIAGNOSIS — G4733 Obstructive sleep apnea (adult) (pediatric): Secondary | ICD-10-CM | POA: Diagnosis not present

## 2017-07-13 DIAGNOSIS — F32A Depression, unspecified: Secondary | ICD-10-CM

## 2017-07-13 DIAGNOSIS — K219 Gastro-esophageal reflux disease without esophagitis: Secondary | ICD-10-CM | POA: Insufficient documentation

## 2017-07-13 NOTE — Assessment & Plan Note (Signed)
Stable, continue present medications.   

## 2017-07-13 NOTE — Progress Notes (Signed)
BP 125/80   Pulse 67   Temp 97.9 F (36.6 C) (Oral)   Ht 5' 5.7" (1.669 m)   Wt 203 lb (92.1 kg)   SpO2 96%   BMI 33.06 kg/m    Subjective:    Patient ID: Dominique Kerr, female    DOB: [birth year], 57 y.o.   MRN: [id number]  HPI: Dominique Kerr is a 57 y.o. female  Chief Complaint  Patient presents with  . Annual Exam   Depression Pt taking Sertraline 100 mg and doing well.  She does not want to make any changes.   Depression screen [facility] 2/9 07/13/2017 05/28/2016 11/28/2015 11/25/2014  Decreased Interest 0 0 0 0  Down, Depressed, Hopeless 0 0 0 0  PHQ - 2 Score 0 0 0 0  Altered sleeping 0 - 0 -  Tired, decreased energy 0 - 0 -  Change in appetite 0 - 0 -  Feeling bad or failure about yourself  1 - 0 -  Trouble concentrating 0 - 0 -  Moving slowly or fidgety/restless 0 - 0 -  Suicidal thoughts 0 - 0 -  PHQ-9 Score 1 - 0 -    Hypercholesterol Admits to not following diet or exercise.  Pt states she "is back" and is restarting lifestyle efforts  Hypothyroid Pt states no change in energy level.  Some weight gain but feels it's due to life-style factors  GERD Taking daily Omeprazole.  Doing well on present dose without need to change.    Sleep apnea Diagnosed in the past.  Not using CPAP.  Denies problems after losing weight. Wakes up rested, minimal snoring.     Relevant past medical, surgical, family and social history reviewed and updated as indicated. Interim medical history since our last visit reviewed. Allergies and medications reviewed and updated.  Review of Systems  Constitutional: Negative.   HENT: Negative.   Eyes: Negative.   Respiratory: Negative.   Cardiovascular: Negative.   Gastrointestinal: Negative.   Endocrine: Negative.   Genitourinary: Negative.   Musculoskeletal: Negative.   Skin: Negative.   Allergic/Immunologic: Negative.   Neurological: Negative.   Hematological: Negative.   Psychiatric/Behavioral: Negative.     Per HPI  unless specifically indicated above     Objective:    BP 125/80   Pulse 67   Temp 97.9 F (36.6 C) (Oral)   Ht 5' 5.7" (1.669 m)   Wt 203 lb (92.1 kg)   SpO2 96%   BMI 33.06 kg/m   Wt Readings from Last 3 Encounters:  07/13/17 203 lb (92.1 kg)  05/28/16 190 lb 11.2 oz (86.5 kg)  11/28/15 197 lb (89.4 kg)    Physical Exam  Constitutional: She is oriented to person, place, and time. She appears well-developed and well-nourished.  HENT:  Head: Normocephalic and atraumatic.  Eyes: Pupils are equal, round, and reactive to light. Right eye exhibits no discharge. Left eye exhibits no discharge. No scleral icterus.  Neck: Normal range of motion. Neck supple. Carotid bruit is not present. No thyromegaly present.  Cardiovascular: Normal rate, regular rhythm and normal heart sounds. Exam reveals no gallop and no friction rub.  No murmur heard. Pulmonary/Chest: Effort normal and breath sounds normal. No respiratory distress. She has no wheezes. She has no rales. There is breast discharge.  Abdominal: Soft. Bowel sounds are normal. There is no tenderness. There is no rebound.  Genitourinary: No breast tenderness or discharge.  Musculoskeletal: Normal range of motion.  Lymphadenopathy:  She has no cervical adenopathy.  Neurological: She is alert and oriented to person, place, and time.  Skin: Skin is warm, dry and intact. No rash noted.  Psychiatric: She has a normal mood and affect. Her speech is normal and behavior is normal. Judgment and thought content normal. Cognition and memory are normal.    Results for orders placed or performed in visit on 05/28/16  Lipid Panel Piccolo, [employer]  Result Value Ref Range   Cholesterol Piccolo, Waived 269 (H) <200 mg/dL   HDL Chol Piccolo, Waived 80 >59 mg/dL   Triglycerides Piccolo,Waived 111 <150 mg/dL   Chol/HDL Ratio Piccolo,Waive 3.4 mg/dL   LDL Chol Calc Piccolo Waived 167 (H) <100 mg/dL   VLDL Chol Calc Piccolo,Waive 22 <30 mg/dL    Glucose Hemocue Waived  Result Value Ref Range   Glu Hemocue Waived 83 65 - 99 mg/dL      Assessment & Plan:   Problem List Items Addressed This Visit      Unprioritized   Adult hypothyroidism    Check thyroid panel today.  Continue current medication if no change      Relevant Orders   Thyroid Panel With TSH   Anxiety and depression    In remission on current medication.  Continue Sertraline      GERD (gastroesophageal reflux disease)    Stable, continue present medications.        HLD (hyperlipidemia)    Check lipid panel      Relevant Orders   Lipid Panel w/o Chol/HDL Ratio   Obstructive sleep apnea of adult    Diet controlled as improved following weight loss.  Discussed signs of sleep apnea which she denies       Other Visit Diagnoses    Routine general medical examination at a health care facility    -  Primary   Relevant Orders   CBC with Differential/Platelet   Comprehensive metabolic panel   HIV antibody   Hepatitis C antibody      HM: Pap and breast exams through gyn Updated Hepatitis C and HIV  Follow up plan: Return in about 6 months (around 01/13/2018).

## 2017-07-13 NOTE — Assessment & Plan Note (Signed)
Check thyroid panel today.  Continue current medication if no change

## 2017-07-13 NOTE — Assessment & Plan Note (Signed)
In remission on current medication.  Continue Sertraline

## 2017-07-13 NOTE — Assessment & Plan Note (Signed)
Check lipid panel  

## 2017-07-13 NOTE — Assessment & Plan Note (Signed)
Diet controlled as improved following weight loss.  Discussed signs of sleep apnea which she denies

## 2017-07-13 NOTE — Patient Instructions (Signed)
Preventive Care 40-64 Years, Female Preventive care refers to lifestyle choices and visits with your health care provider that can promote health and wellness. What does preventive care include?  A yearly physical exam. This is also called an annual well check.  Dental exams once or twice a year.  Routine eye exams. Ask your health care provider how often you should have your eyes checked.  Personal lifestyle choices, including: ? Daily care of your teeth and gums. ? Regular physical activity. ? Eating a healthy diet. ? Avoiding tobacco and drug use. ? Limiting alcohol use. ? Practicing safe sex. ? Taking low-dose aspirin daily starting at age 58. ? Taking vitamin and mineral supplements as recommended by your health care provider. What happens during an annual well check? The services and screenings done by your health care provider during your annual well check will depend on your age, overall health, lifestyle risk factors, and family history of disease. Counseling Your health care provider may ask you questions about your:  Alcohol use.  Tobacco use.  Drug use.  Emotional well-being.  Home and relationship well-being.  Sexual activity.  Eating habits.  Work and work Statistician.  Method of birth control.  Menstrual cycle.  Pregnancy history.  Screening You may have the following tests or measurements:  Height, weight, and BMI.  Blood pressure.  Lipid and cholesterol levels. These may be checked every 5 years, or more frequently if you are over 81 years old.  Skin check.  Lung cancer screening. You may have this screening every year starting at age 78 if you have a 30-pack-year history of smoking and currently smoke or have quit within the past 15 years.  Fecal occult blood test (FOBT) of the stool. You may have this test every year starting at age 65.  Flexible sigmoidoscopy or colonoscopy. You may have a sigmoidoscopy every 5 years or a colonoscopy  every 10 years starting at age 30.  Hepatitis C blood test.  Hepatitis B blood test.  Sexually transmitted disease (STD) testing.  Diabetes screening. This is done by checking your blood sugar (glucose) after you have not eaten for a while (fasting). You may have this done every 1-3 years.  Mammogram. This may be done every 1-2 years. Talk to your health care provider about when you should start having regular mammograms. This may depend on whether you have a family history of breast cancer.  BRCA-related cancer screening. This may be done if you have a family history of breast, ovarian, tubal, or peritoneal cancers.  Pelvic exam and Pap test. This may be done every 3 years starting at age 80. Starting at age 36, this may be done every 5 years if you have a Pap test in combination with an HPV test.  Bone density scan. This is done to screen for osteoporosis. You may have this scan if you are at high risk for osteoporosis.  Discuss your test results, treatment options, and if necessary, the need for more tests with your health care provider. Vaccines Your health care provider may recommend certain vaccines, such as:  Influenza vaccine. This is recommended every year.  Tetanus, diphtheria, and acellular pertussis (Tdap, Td) vaccine. You may need a Td booster every 10 years.  Varicella vaccine. You may need this if you have not been vaccinated.  Zoster vaccine. You may need this after age 5.  Measles, mumps, and rubella (MMR) vaccine. You may need at least one dose of MMR if you were born in  1955 or later. You may also need a second dose.  Pneumococcal 13-valent conjugate (PCV13) vaccine. You may need this if you have certain conditions and were not previously vaccinated.  Pneumococcal polysaccharide (PPSV23) vaccine. You may need one or two doses if you smoke cigarettes or if you have certain conditions.  Meningococcal vaccine. You may need this if you have certain  conditions.  Hepatitis A vaccine. You may need this if you have certain conditions or if you travel or work in places where you may be exposed to hepatitis A.  Hepatitis B vaccine. You may need this if you have certain conditions or if you travel or work in places where you may be exposed to hepatitis B.  Haemophilus influenzae type b (Hib) vaccine. You may need this if you have certain conditions.  Talk to your health care provider about which screenings and vaccines you need and how often you need them. This information is not intended to replace advice given to you by your health care provider. Make sure you discuss any questions you have with your health care provider. Document Released: 03/07/2015 Document Revised: 10/29/2015 Document Reviewed: 12/10/2014 Elsevier Interactive Patient Education  [employer].

## 2017-07-14 ENCOUNTER — Encounter: Payer: Self-pay | Admitting: Unknown Physician Specialty

## 2017-07-14 LAB — CBC WITH DIFFERENTIAL/PLATELET
BASOS ABS: 0 10*3/uL (ref 0.0–0.2)
Basos: 1 %
EOS (ABSOLUTE): 0.2 10*3/uL (ref 0.0–0.4)
EOS: 4 %
HEMATOCRIT: 38.7 % (ref 34.0–46.6)
HEMOGLOBIN: 12.8 g/dL (ref 11.1–15.9)
Immature Grans (Abs): 0 10*3/uL (ref 0.0–0.1)
Immature Granulocytes: 0 %
LYMPHS ABS: 1.2 10*3/uL (ref 0.7–3.1)
LYMPHS: 28 %
MCH: 27.7 pg (ref 26.6–33.0)
MCHC: 33.1 g/dL (ref 31.5–35.7)
MCV: 84 fL (ref 79–97)
MONOCYTES: 7 %
Monocytes Absolute: 0.3 10*3/uL (ref 0.1–0.9)
NEUTROS ABS: 2.7 10*3/uL (ref 1.4–7.0)
Neutrophils: 60 %
Platelets: 290 10*3/uL (ref 150–450)
RBC: 4.62 x10E6/uL (ref 3.77–5.28)
RDW: 14.1 % (ref 12.3–15.4)
WBC: 4.3 10*3/uL (ref 3.4–10.8)

## 2017-07-14 LAB — COMPREHENSIVE METABOLIC PANEL
ALBUMIN: 4.5 g/dL (ref 3.5–5.5)
ALK PHOS: 64 IU/L (ref 39–117)
ALT: 15 IU/L (ref 0–32)
AST: 16 IU/L (ref 0–40)
Albumin/Globulin Ratio: 2.1 (ref 1.2–2.2)
BUN / CREAT RATIO: 18 (ref 9–23)
BUN: 13 mg/dL (ref 6–24)
Bilirubin Total: 0.3 mg/dL (ref 0.0–1.2)
CO2: 26 mmol/L (ref 20–29)
CREATININE: 0.71 mg/dL (ref 0.57–1.00)
Calcium: 9.4 mg/dL (ref 8.7–10.2)
Chloride: 102 mmol/L (ref 96–106)
GFR calc non Af Amer: 97 mL/min/{1.73_m2} (ref >59)
GFR, EST AFRICAN AMERICAN: 112 mL/min/{1.73_m2} (ref >59)
GLUCOSE: 92 mg/dL (ref 65–99)
Globulin, Total: 2.1 g/dL (ref 1.5–4.5)
Potassium: 4.5 mmol/L (ref 3.5–5.2)
SODIUM: 141 mmol/L (ref 134–144)
TOTAL PROTEIN: 6.6 g/dL (ref 6.0–8.5)

## 2017-07-14 LAB — HIV ANTIBODY (ROUTINE TESTING W REFLEX): HIV Screen 4th Generation wRfx: NONREACTIVE

## 2017-07-14 LAB — THYROID PANEL WITH TSH
FREE THYROXINE INDEX: 1.9 (ref 1.2–4.9)
T3 UPTAKE RATIO: 25 % (ref 24–39)
T4 TOTAL: 7.4 ug/dL (ref 4.5–12.0)
TSH: 0.352 u[IU]/mL — AB (ref 0.450–4.500)

## 2017-07-14 LAB — LIPID PANEL W/O CHOL/HDL RATIO
Cholesterol, Total: 278 mg/dL — ABNORMAL HIGH (ref 100–199)
HDL: 70 mg/dL (ref >39)
LDL CALC: 181 mg/dL — AB (ref 0–99)
Triglycerides: 133 mg/dL (ref 0–149)
VLDL CHOLESTEROL CAL: 27 mg/dL (ref 5–40)

## 2017-07-14 LAB — HEPATITIS C ANTIBODY: Hep C Virus Ab: <0.1 s/co ratio (ref 0.0–0.9)

## 2017-07-14 NOTE — Progress Notes (Signed)
Here are your labs.  Your cholesterol is still high, but continued recommendations to make lifestyle changes. Your LDL is above normal.  The LDL is the "lousy" or bad cholesterol.  Over time and in combination with inflammation and other factors, this contributes to plaque which in turn may lead to stroke and/or heart attack down the road.  Sometimes high LDL is primarily genetic, and people might be eating all the right foods but still have high numbers.  Other times, there is room for improvement in one's diet and eating healthier can bring this number down and potentially reduce one's risk of heart attack and/or stroke.   To reduce your LDL, Remember - more fruits and vegetables, more fish, and limitnred meat and dairy products.  More soy, nuts, beans, barley, lentils, oats and plant sterol ester enriched margarine instead of butter.  I also encourage eliminating sugar and processed food.  Remember, shop on the outside of the grocery store and visit your [employer].   If you would like to talk with me about dietary changes plus or minus medications for your cholesterol, please let me know. We should recheck your cholesterol in 3-6 months.  Your TSH is low, which means your thyroid medication may be too high.  I would recommend decreasing the dose and rechecking in 3 months.  Send me a mychart message to let me know you got this note and I will make the changes

## 2017-07-19 ENCOUNTER — Other Ambulatory Visit: Payer: Self-pay | Admitting: Unknown Physician Specialty

## 2017-07-19 DIAGNOSIS — E039 Hypothyroidism, unspecified: Secondary | ICD-10-CM

## 2017-07-19 MED ORDER — LEVOTHYROXINE SODIUM 150 MCG PO TABS: 150.0000 ug | ORAL_TABLET | Freq: Every day | ORAL | 3 refills | Status: DC

## 2017-07-19 NOTE — Progress Notes (Signed)
Please make sure she comes in for a repeat TSH in 2-3 months

## 2017-10-05 DIAGNOSIS — R05 Cough: Secondary | ICD-10-CM | POA: Diagnosis not present

## 2017-10-05 DIAGNOSIS — E782 Mixed hyperlipidemia: Secondary | ICD-10-CM | POA: Diagnosis not present

## 2017-10-05 DIAGNOSIS — J019 Acute sinusitis, unspecified: Secondary | ICD-10-CM | POA: Diagnosis not present

## 2017-10-05 DIAGNOSIS — Z0130 Encounter for examination of blood pressure without abnormal findings: Secondary | ICD-10-CM | POA: Diagnosis not present

## 2018-01-12 ENCOUNTER — Other Ambulatory Visit: Payer: Self-pay | Admitting: Obstetrics and Gynecology

## 2018-01-12 DIAGNOSIS — Z1231 Encounter for screening mammogram for malignant neoplasm of breast: Secondary | ICD-10-CM

## 2018-01-12 DIAGNOSIS — Z01419 Encounter for gynecological examination (general) (routine) without abnormal findings: Secondary | ICD-10-CM | POA: Diagnosis not present

## 2018-01-18 ENCOUNTER — Ambulatory Visit: Payer: BLUE CROSS/BLUE SHIELD | Admitting: Unknown Physician Specialty

## 2018-01-18 ENCOUNTER — Ambulatory Visit: Payer: Self-pay | Admitting: Nurse Practitioner

## 2018-01-20 ENCOUNTER — Other Ambulatory Visit: Payer: Self-pay | Admitting: Family Medicine

## 2018-01-24 ENCOUNTER — Ambulatory Visit (INDEPENDENT_AMBULATORY_CARE_PROVIDER_SITE_OTHER): Payer: BLUE CROSS/BLUE SHIELD | Admitting: Nurse Practitioner

## 2018-01-24 ENCOUNTER — Other Ambulatory Visit: Payer: Self-pay

## 2018-01-24 ENCOUNTER — Encounter: Payer: Self-pay | Admitting: Nurse Practitioner

## 2018-01-24 VITALS — BP 131/86 | HR 66 | Temp 98.2°F | Ht 66.0 in | Wt 211.0 lb

## 2018-01-24 DIAGNOSIS — F32A Depression, unspecified: Secondary | ICD-10-CM

## 2018-01-24 DIAGNOSIS — E039 Hypothyroidism, unspecified: Secondary | ICD-10-CM

## 2018-01-24 DIAGNOSIS — K219 Gastro-esophageal reflux disease without esophagitis: Secondary | ICD-10-CM | POA: Diagnosis not present

## 2018-01-24 DIAGNOSIS — F419 Anxiety disorder, unspecified: Secondary | ICD-10-CM | POA: Diagnosis not present

## 2018-01-24 DIAGNOSIS — Z23 Encounter for immunization: Secondary | ICD-10-CM

## 2018-01-24 DIAGNOSIS — E7800 Pure hypercholesterolemia, unspecified: Secondary | ICD-10-CM

## 2018-01-24 DIAGNOSIS — F329 Major depressive disorder, single episode, unspecified: Secondary | ICD-10-CM

## 2018-01-24 NOTE — Progress Notes (Signed)
BP 131/86   Pulse 66   Temp 98.2 F (36.8 C) (Oral)   Ht 5\' 6"  (1.676 m)   Wt 211 lb (95.7 kg)   SpO2 96%   BMI 34.06 kg/m    Subjective:    Patient ID: Dominique Kerr, female    DOB: [birth year], 57 y.o.   MRN: [id number]  HPI: Dominique Kerr is a 57 y.o. female  Chief Complaint  Patient presents with  . Hypothyroidism    f/u  . Hyperlipidemia  . Anxiety  . Gastroesophageal Reflux   HYPOTHYROIDISM In May dose decreased to 150 MCG d/t TSH 0.352. Thyroid control status:stable Satisfied with current treatment? yes Medication side effects: no Medication compliance: excellent compliance Etiology of hypothyroidism:  Recent dose adjustment:yes Fatigue: no Cold intolerance: no Heat intolerance: no Weight gain: no Weight loss: no Constipation: no Diarrhea/loose stools: no Palpitations: no Lower extremity edema: no Anxiety/depressed mood: no   GERD Occasionally has days where she has heart burn episodes, 1-2 times a week.  Reports it is not unbearable, just uncomfortable.  No food journal. GERD control status: stable  Satisfied with current treatment? yes Heartburn frequency:  Medication side effects: no  Medication compliance: stable Previous GERD medications: Antacid use frequency:   Duration:  Nature:  Location:  Heartburn duration:  Alleviatiating factors:   Aggravating factors:  Dysphagia: no Odynophagia:  no Hematemesis: no Blood in stool: no EGD: no  HYPERLIPIDEMIA Currently not taking medication.  During last labs in May her TCHOL 278 and LDL 181.  She has been trying to focus on diet and exercise. The 10-year ASCVD risk score Mikey Bussing DC Brooke Bonito., et al., 2013) is: 2.3%   Values used to calculate the score:     Age: 86 years     Sex: Female     Is Non-Hispanic African American: No     Diabetic: No     Tobacco smoker: No     Systolic Blood Pressure: 778 mmHg     Is BP treated: No     HDL Cholesterol: 70 mg/dL     Total Cholesterol: 278  mg/dL Chest pain:  no Coronary artery disease:  no Family history CAD:  yes Family history early CAD:  no   ANXIETY/STRESS Duration:controlled Anxious mood: no  Excessive worrying: no Irritability: no  Sweating: no Nausea: no Palpitations:no Hyperventilation: no Panic attacks: no Agoraphobia: no  Obscessions/compulsions: no Depressed mood: no Depression screen [facility] 2/9 01/24/2018 07/13/2017 05/28/2016 11/28/2015 11/25/2014  Decreased Interest 0 0 0 0 0  Down, Depressed, Hopeless 0 0 0 0 0  PHQ - 2 Score 0 0 0 0 0  Altered sleeping 0 0 - 0 -  Tired, decreased energy 1 0 - 0 -  Change in appetite 0 0 - 0 -  Feeling bad or failure about yourself  1 1 - 0 -  Trouble concentrating 0 0 - 0 -  Moving slowly or fidgety/restless 0 0 - 0 -  Suicidal thoughts 0 0 - 0 -  PHQ-9 Score 2 1 - 0 -  Difficult doing work/chores Not difficult at all - - - -   Anhedonia: no Weight changes: no Insomnia: none Hypersomnia: no Fatigue/loss of energy: no Feelings of worthlessness: no Feelings of guilt: no Impaired concentration/indecisiveness: no Suicidal ideations: no  Crying spells: no Recent Stressors/Life Changes: no   Relationship problems: no   Family stress: no     Financial stress: no    Job stress: no  Recent death/loss: no  Relevant past medical, surgical, family and social history reviewed and updated as indicated. Interim medical history since our last visit reviewed. Allergies and medications reviewed and updated.  Review of Systems  Constitutional: Negative for activity change, appetite change and fatigue.  Respiratory: Negative for cough, chest tightness and shortness of breath.   Cardiovascular: Negative for chest pain, palpitations and leg swelling.  Gastrointestinal: Negative for abdominal distention, abdominal pain, constipation, diarrhea, nausea and vomiting.  Endocrine: Negative.   Neurological: Negative for dizziness, numbness and headaches.  Psychiatric/Behavioral:  Negative.     Per HPI unless specifically indicated above     Objective:    BP 131/86   Pulse 66   Temp 98.2 F (36.8 C) (Oral)   Ht 5\' 6"  (1.676 m)   Wt 211 lb (95.7 kg)   SpO2 96%   BMI 34.06 kg/m   Wt Readings from Last 3 Encounters:  01/24/18 211 lb (95.7 kg)  07/13/17 203 lb (92.1 kg)  05/28/16 190 lb 11.2 oz (86.5 kg)    Physical Exam  Constitutional: She is oriented to person, place, and time. She appears well-developed and well-nourished.  HENT:  Head: Normocephalic.  Eyes: Pupils are equal, round, and reactive to light. Conjunctivae and EOM are normal. Right eye exhibits no discharge. Left eye exhibits no discharge.  Neck: Normal range of motion. Neck supple. No JVD present. Carotid bruit is not present. No thyromegaly present.  Cardiovascular: Normal rate, regular rhythm and normal heart sounds.  Pulmonary/Chest: Effort normal and breath sounds normal.  Abdominal: Soft. Bowel sounds are normal.  Lymphadenopathy:    She has no cervical adenopathy.  Neurological: She is alert and oriented to person, place, and time.  Skin: Skin is warm and dry.  Psychiatric: She has a normal mood and affect. Her behavior is normal. Judgment and thought content normal.  Nursing note and vitals reviewed.   Results for orders placed or performed in visit on 07/13/17  CBC with Differential/Platelet  Result Value Ref Range   WBC 4.3 3.4 - 10.8 x10E3/uL   RBC 4.62 3.77 - 5.28 x10E6/uL   Hemoglobin 12.8 11.1 - 15.9 g/dL   Hematocrit 38.7 34.0 - 46.6 %   MCV 84 79 - 97 fL   MCH 27.7 26.6 - 33.0 pg   MCHC 33.1 31.5 - 35.7 g/dL   RDW 14.1 12.3 - 15.4 %   Platelets 290 150 - 450 x10E3/uL   Neutrophils 60 Not Estab. %   Lymphs 28 Not Estab. %   Monocytes 7 Not Estab. %   Eos 4 Not Estab. %   Basos 1 Not Estab. %   Neutrophils Absolute 2.7 1.4 - 7.0 x10E3/uL   Lymphocytes Absolute 1.2 0.7 - 3.1 x10E3/uL   Monocytes Absolute 0.3 0.1 - 0.9 x10E3/uL   EOS (ABSOLUTE) 0.2 0.0 - 0.4  x10E3/uL   Basophils Absolute 0.0 0.0 - 0.2 x10E3/uL   Immature Granulocytes 0 Not Estab. %   Immature Grans (Abs) 0.0 0.0 - 0.1 x10E3/uL  Comprehensive metabolic panel  Result Value Ref Range   Glucose 92 65 - 99 mg/dL   BUN 13 6 - 24 mg/dL   Creatinine, Ser 0.71 0.57 - 1.00 mg/dL   GFR calc non Af Amer 97 >59 mL/min/1.73   GFR calc Af Amer 112 >59 mL/min/1.73   BUN/Creatinine Ratio 18 9 - 23   Sodium 141 134 - 144 mmol/L   Potassium 4.5 3.5 - 5.2 mmol/L   Chloride 102 96 -  106 mmol/L   CO2 26 20 - 29 mmol/L   Calcium 9.4 8.7 - 10.2 mg/dL   Total Protein 6.6 6.0 - 8.5 g/dL   Albumin 4.5 3.5 - 5.5 g/dL   Globulin, Total 2.1 1.5 - 4.5 g/dL   Albumin/Globulin Ratio 2.1 1.2 - 2.2   Bilirubin Total 0.3 0.0 - 1.2 mg/dL   Alkaline Phosphatase 64 39 - 117 IU/L   AST 16 0 - 40 IU/L   ALT 15 0 - 32 IU/L  Lipid Panel w/o Chol/HDL Ratio  Result Value Ref Range   Cholesterol, Total 278 (H) 100 - 199 mg/dL   Triglycerides 133 0 - 149 mg/dL   HDL 70 >39 mg/dL   VLDL Cholesterol Cal 27 5 - 40 mg/dL   LDL Calculated 181 (H) 0 - 99 mg/dL  Thyroid Panel With TSH  Result Value Ref Range   TSH 0.352 (L) 0.450 - 4.500 uIU/mL   T4, Total 7.4 4.5 - 12.0 ug/dL   T3 Uptake Ratio 25 24 - 39 %   Free Thyroxine Index 1.9 1.2 - 4.9  HIV antibody  Result Value Ref Range   HIV Screen 4th Generation wRfx Non Reactive Non Reactive  Hepatitis C antibody  Result Value Ref Range   Hep C Virus Ab <0.1 0.0 - 0.9 s/co ratio      Assessment & Plan:   Problem List Items Addressed This Visit      Digestive   GERD (gastroesophageal reflux disease)    Stable, continue current med regimen.        Endocrine   Adult hypothyroidism - Primary    Thyroid panel today.  Adjust dose as needed.      Relevant Orders   Thyroid Panel With TSH     Other   HLD (hyperlipidemia)    Check lipid panel today and initiate meds as needed.      Relevant Orders   Lipid Profile   Anxiety and depression    Stable,  continue current med regimen.       Other Visit Diagnoses    Flu vaccine need       Relevant Orders   Flu Vaccine QUAD 36+ mos IM (Completed)       Follow up plan: Return in about 6 months (around 07/26/2018) for HLD/Hypothyroid.

## 2018-01-24 NOTE — Assessment & Plan Note (Signed)
Check lipid panel today and initiate meds as needed.

## 2018-01-24 NOTE — Assessment & Plan Note (Signed)
Stable, continue current med regimen.

## 2018-01-24 NOTE — Patient Instructions (Signed)
Hypothyroidism Hypothyroidism is a disorder of the thyroid. The thyroid is a large gland that is located in the lower front of the neck. The thyroid releases hormones that control how the body works. With hypothyroidism, the thyroid does not make enough of these hormones. What are the causes? Causes of hypothyroidism may include:  Viral infections.  Pregnancy.  Your own defense system (immune system) attacking your thyroid.  Certain medicines.  Birth defects.  Past radiation treatments to your head or neck.  Past treatment with radioactive iodine.  Past surgical removal of part or all of your thyroid.  Problems with the gland that is located in the center of your brain (pituitary).  What are the signs or symptoms? Signs and symptoms of hypothyroidism may include:  Feeling as though you have no energy (lethargy).  Inability to tolerate cold.  Weight gain that is not explained by a change in diet or exercise habits.  Dry skin.  Coarse hair.  Menstrual irregularity.  Slowing of thought processes.  Constipation.  Sadness or depression.  How is this diagnosed? Your health care provider may diagnose hypothyroidism with blood tests and ultrasound tests. How is this treated? Hypothyroidism is treated with medicine that replaces the hormones that your body does not make. After you begin treatment, it may take several weeks for symptoms to go away. Follow these instructions at home:  Take medicines only as directed by your health care provider.  If you start taking any new medicines, tell your health care provider.  Keep all follow-up visits as directed by your health care provider. This is important. As your condition improves, your dosage needs may change. You will need to have blood tests regularly so that your health care provider can watch your condition. Contact a health care provider if:  Your symptoms do not get better with treatment.  You are taking thyroid  replacement medicine and: ? You sweat excessively. ? You have tremors. ? You feel anxious. ? You lose weight rapidly. ? You cannot tolerate heat. ? You have emotional swings. ? You have diarrhea. ? You feel weak. Get help right away if:  You develop chest pain.  You develop an irregular heartbeat.  You develop a rapid heartbeat. This information is not intended to replace advice given to you by your health care provider. Make sure you discuss any questions you have with your health care provider. Document Released: 02/08/2005 Document Revised: 07/17/2015 Document Reviewed: 06/26/2013 Elsevier Interactive Patient Education  2018 [employer] Inc[employer]  

## 2018-01-24 NOTE — Assessment & Plan Note (Signed)
Thyroid panel today.  Adjust dose as needed.

## 2018-01-25 ENCOUNTER — Other Ambulatory Visit: Payer: Self-pay | Admitting: Nurse Practitioner

## 2018-01-25 LAB — THYROID PANEL WITH TSH
FREE THYROXINE INDEX: 1.3 (ref 1.2–4.9)
T3 UPTAKE RATIO: 22 % — AB (ref 24–39)
T4 TOTAL: 6.1 ug/dL (ref 4.5–12.0)
TSH: 7.02 u[IU]/mL — ABNORMAL HIGH (ref 0.450–4.500)

## 2018-01-25 LAB — LIPID PANEL
CHOLESTEROL TOTAL: 284 mg/dL — AB (ref 100–199)
Chol/HDL Ratio: 3.8 ratio (ref 0.0–4.4)
HDL: 74 mg/dL (ref >39)
LDL CALC: 189 mg/dL — AB (ref 0–99)
TRIGLYCERIDES: 104 mg/dL (ref 0–149)
VLDL Cholesterol Cal: 21 mg/dL (ref 5–40)

## 2018-01-25 MED ORDER — LEVOTHYROXINE SODIUM 175 MCG PO TABS: 175.0000 ug | ORAL_TABLET | Freq: Every day | ORAL | 5 refills | Status: DC

## 2018-01-25 NOTE — Progress Notes (Signed)
Labs 01/24/18 showed TSH 7.020 and T3 22.  Patient had dose reduction in May due to TSH 0.352, had been taking 175 MCG and was reduced to 150 MCG.  Have informed patient via MyChart about labs and dose change.  Have advised her to let me know message received.  Also left message, general, via telephone.  Will increase dose to 175 MCG and recheck in 3 months.

## 2018-01-26 ENCOUNTER — Telehealth: Payer: Self-pay | Admitting: Nurse Practitioner

## 2018-01-26 NOTE — Telephone Encounter (Signed)
Would like an office visit and TSH recheck with me.  Thanks.

## 2018-01-26 NOTE — Telephone Encounter (Signed)
Called patient and relayed message Dr. Marnee Guarneri, DNP had sent via mychart,   "Good morning. Cholesterol panel remains elevated with total cholesterol 284 and LDL (bad cholesterol) 189. Current risk ratio remains on low end at 2.3%. Over time and in combination with inflammation and other factors, this contributes to plaque which in turn may lead to stroke and/or heart attack down the road. Sometimes high LDL is primarily genetic, and people might be eating all the right foods but still have high numbers. Other times, there is room for improvement in one's diet and eating healthier can bring this number down and potentially reduce one's risk of heart attack and/or stroke.    To reduce your LDL, Remember - more fruits and vegetables, more fish, and limitnred meat and dairy products. More soy, nuts, beans, barley, lentils, oats and plant sterol ester enriched margarine instead of butter. I also encourage eliminating sugar and processed food. Remember, shop on the outside of the grocery store and visit your [employer]. If you would like to talk with me about dietary changes plus or minus medications for your cholesterol, please let me know. We should recheck your cholesterol in 3-6 months.    Your TSH is high, which means your thyroid medication may be too low. I would recommend increasing the dose and rechecking in 3 months. Send me a mychart message to let me know you got this note and I will make the changes (have sent new Levothyroxine dose to pharmacy, will go up 25 MCG). "  Patient stated she would not need a refill has she has some at home because she had just picked that dosage up when it was changed. She will call back if she needs a refill.

## 2018-01-26 NOTE — Telephone Encounter (Signed)
Did you want her to come back for TSH lab only visit or OV with you?

## 2018-01-26 NOTE — Telephone Encounter (Signed)
Copied from [place] [id number]. Topic: Quick Communication - See Telephone Encounter >> Jan 26, 2018  3:16 PM Rutherford Nail, NT wrote: CRM for notification. See Telephone encounter for: 01/26/18. Patient calling to obtain lab results. Does not remember her mychart password. Would like a call to discuss.

## 2018-02-10 ENCOUNTER — Ambulatory Visit
Admission: RE | Admit: 2018-02-10 | Discharge: 2018-02-10 | Disposition: A | Discharge status: Home / Self Care | Payer: BLUE CROSS/BLUE SHIELD | Source: Ambulatory Visit | Attending: Obstetrics and Gynecology | Admitting: Obstetrics and Gynecology

## 2018-02-10 DIAGNOSIS — Z1231 Encounter for screening mammogram for malignant neoplasm of breast: Secondary | ICD-10-CM | POA: Diagnosis not present

## 2018-03-15 ENCOUNTER — Other Ambulatory Visit: Payer: Self-pay | Admitting: Nurse Practitioner

## 2018-03-15 MED ORDER — LEVOTHYROXINE SODIUM 175 MCG PO TABS: 175.0000 ug | ORAL_TABLET | Freq: Every day | ORAL | 1 refills | Status: DC

## 2018-03-15 NOTE — Telephone Encounter (Signed)
Copied from [place]. Topic: Quick Communication - Rx Refill/Question >> Mar 15, 2018 12:16 PM Burchel, Abbi R wrote: Medication: levothyroxine (SYNTHROID, LEVOTHROID) 175 MCG tablet  Preferred Pharmacy [id number] - [place], [place] [address] [place] [address] Phone: [phone] Fax: [phone]   Pt was advised that RX refills may take up to 3 business days. We ask that you follow-up with your pharmacy.

## 2018-04-21 ENCOUNTER — Other Ambulatory Visit: Payer: Self-pay | Admitting: Unknown Physician Specialty

## 2018-05-01 ENCOUNTER — Ambulatory Visit: Payer: Self-pay | Admitting: Nurse Practitioner

## 2018-05-03 ENCOUNTER — Other Ambulatory Visit: Payer: Self-pay

## 2018-05-03 ENCOUNTER — Ambulatory Visit (INDEPENDENT_AMBULATORY_CARE_PROVIDER_SITE_OTHER): Payer: BLUE CROSS/BLUE SHIELD | Admitting: Nurse Practitioner

## 2018-05-03 ENCOUNTER — Encounter: Payer: Self-pay | Admitting: Nurse Practitioner

## 2018-05-03 VITALS — BP 131/84 | HR 69 | Temp 98.3°F | Ht 66.0 in | Wt 220.0 lb

## 2018-05-03 DIAGNOSIS — E039 Hypothyroidism, unspecified: Secondary | ICD-10-CM

## 2018-05-03 NOTE — Progress Notes (Signed)
BP 131/84   Pulse 69   Temp 98.3 F (36.8 C) (Oral)   Ht 5\' 6"  (1.676 m)   Wt 220 lb (99.8 kg)   SpO2 95%   BMI 35.51 kg/m    Subjective:    Patient ID: Dominique Kerr, female    DOB: May 07, 1960, 57 y.o.   MRN: [id number]  HPI: Dominique Kerr is a 58 y.o. female  Chief Complaint  Patient presents with  . Hypothyroidism    f/u   HYPOTHYROIDISM TSH 01/24/18 7.020 and T4 6.1. Had dose reduction May 2019 due to TSH 0.352, had been on 175 MCG and switched to 150 MCG.  Dose increased during last visit to 175 MCG.   Thyroid control status:stable Satisfied with current treatment? yes Medication side effects: no Medication compliance: good compliance Etiology of hypothyroidism:  Recent dose adjustment:In December 2019 (3 months ago) Fatigue: no Cold intolerance: no Heat intolerance: no Weight gain: yes Weight loss: no Constipation: no Diarrhea/loose stools: no Palpitations: no Lower extremity edema: yes- at the end of the 8 hour work day Anxiety/depressed mood: no  Relevant past medical, surgical, family and social history reviewed and updated as indicated. Interim medical history since our last visit reviewed. Allergies and medications reviewed and updated.  Review of Systems  Per HPI unless specifically indicated above     Objective:    BP 131/84   Pulse 69   Temp 98.3 F (36.8 C) (Oral)   Ht 5\' 6"  (1.676 m)   Wt 220 lb (99.8 kg)   SpO2 95%   BMI 35.51 kg/m   Wt Readings from Last 3 Encounters:  05/03/18 220 lb (99.8 kg)  01/24/18 211 lb (95.7 kg)  07/13/17 203 lb (92.1 kg)    Physical Exam Vitals signs and nursing note reviewed.  Constitutional:      Appearance: Normal appearance. She is well-developed.  HENT:     Head: Normocephalic.  Eyes:     General:        Right eye: No discharge.        Left eye: No discharge.     Conjunctiva/sclera: Conjunctivae normal.     Pupils: Pupils are equal, round, and reactive to light.  Neck:   Musculoskeletal: Normal range of motion and neck supple.     Thyroid: No thyromegaly.     Vascular: No carotid bruit or JVD.  Cardiovascular:     Rate and Rhythm: Normal rate and regular rhythm.     Pulses: Normal pulses.          Posterior tibial pulses are 2+ on the right side and 2+ on the left side.     Heart sounds: Normal heart sounds, S1 normal and S2 normal.  Pulmonary:     Effort: Pulmonary effort is normal.     Breath sounds: Normal breath sounds.  Abdominal:     General: Bowel sounds are normal.     Palpations: Abdomen is soft.  Lymphadenopathy:     Cervical: No cervical adenopathy.  Skin:    General: Skin is warm and dry.     Capillary Refill: Capillary refill takes less than 2 seconds.  Neurological:     Mental Status: She is alert and oriented to person, place, and time.  Psychiatric:        Behavior: Behavior normal.        Thought Content: Thought content normal.        Judgment: Judgment normal.     Results for  orders placed or performed in visit on 01/24/18  Lipid Profile  Result Value Ref Range   Cholesterol, Total 284 (H) 100 - 199 mg/dL   Triglycerides 104 0 - 149 mg/dL   HDL 74 >39 mg/dL   VLDL Cholesterol Cal 21 5 - 40 mg/dL   LDL Calculated 189 (H) 0 - 99 mg/dL   Chol/HDL Ratio 3.8 0.0 - 4.4 ratio  Thyroid Panel With TSH  Result Value Ref Range   TSH 7.020 (H) 0.450 - 4.500 uIU/mL   T4, Total 6.1 4.5 - 12.0 ug/dL   T3 Uptake Ratio 22 (L) 24 - 39 %   Free Thyroxine Index 1.3 1.2 - 4.9      Assessment & Plan:   Problem List Items Addressed This Visit      Endocrine   Adult hypothyroidism - Primary    Chronic, ongoing. Dose adjustment last visit. Continue Synthroid 175MCG. Labs today- will monitor and adjust dose as needed. Follow-up in 6 months.       Relevant Orders   Thyroid Panel With TSH       Follow up plan: Return in about 6 months (around 11/03/2018) for Hypothyroid.   NOTE WRITTEN BY [employer] DNP STUDENT.  ASSESSMENT AND PLAN OF  CARE REVIEWED WITH STUDENT, AGREE WITH ABOVE FINDINGS AND PLAN.

## 2018-05-03 NOTE — Assessment & Plan Note (Addendum)
Chronic, ongoing. Dose adjustment last visit. Continue Synthroid 175MCG. Labs today- will monitor and adjust dose as needed. Follow-up in 6 months.

## 2018-05-03 NOTE — Patient Instructions (Signed)
Hypothyroidism  Hypothyroidism is when the thyroid gland does not make enough of certain hormones (it is underactive). The thyroid gland is a small gland located in the lower front part of the neck, just in front of the windpipe (trachea). This gland makes hormones that help control how the body uses food for energy (metabolism) as well as how the heart and brain function. These hormones also play a role in keeping your bones strong. When the thyroid is underactive, it produces too little of the hormones thyroxine (T4) and triiodothyronine (T3). What are the causes? This condition may be caused by:  Hashimoto's disease. This is a disease in which the body's disease-fighting system (immune system) attacks the thyroid gland. This is the most common cause.  Viral infections.  Pregnancy.  Certain medicines.  Birth defects.  Past radiation treatments to the head or neck for cancer.  Past treatment with radioactive iodine.  Past exposure to radiation in the environment.  Past surgical removal of part or all of the thyroid.  Problems with a gland in the center of the brain (pituitary gland).  Lack of enough iodine in the diet. What increases the risk? You are more likely to develop this condition if:  You are female.  You have a family history of thyroid conditions.  You use a medicine called lithium.  You take medicines that affect the immune system (immunosuppressants). What are the signs or symptoms? Symptoms of this condition include:  Feeling as though you have no energy (lethargy).  Not being able to tolerate cold.  Weight gain that is not explained by a change in diet or exercise habits.  Lack of appetite.  Dry skin.  Coarse hair.  Menstrual irregularity.  Slowing of thought processes.  Constipation.  Sadness or depression. How is this diagnosed? This condition may be diagnosed based on:  Your symptoms, your medical history, and a physical exam.  Blood  tests. You may also have imaging tests, such as an ultrasound or MRI. How is this treated? This condition is treated with medicine that replaces the thyroid hormones that your body does not make. After you begin treatment, it may take several weeks for symptoms to go away. Follow these instructions at home:  Take over-the-counter and prescription medicines only as told by your health care provider.  If you start taking any new medicines, tell your health care provider.  Keep all follow-up visits as told by your health care provider. This is important. ? As your condition improves, your dosage of thyroid hormone medicine may change. ? You will need to have blood tests regularly so that your health care provider can monitor your condition. Contact a health care provider if:  Your symptoms do not get better with treatment.  You are taking thyroid replacement medicine and you: ? Sweat a lot. ? Have tremors. ? Feel anxious. ? Lose weight rapidly. ? Cannot tolerate heat. ? Have emotional swings. ? Have diarrhea. ? Feel weak. Get help right away if you have:  Chest pain.  An irregular heartbeat.  A rapid heartbeat.  Difficulty breathing. Summary  Hypothyroidism is when the thyroid gland does not make enough of certain hormones (it is underactive).  When the thyroid is underactive, it produces too little of the hormones thyroxine (T4) and triiodothyronine (T3).  The most common cause is Hashimoto's disease, a disease in which the body's disease-fighting system (immune system) attacks the thyroid gland. The condition can also be caused by viral infections, medicine, pregnancy, or past   radiation treatment to the head or neck.  Symptoms may include weight gain, dry skin, constipation, feeling as though you do not have energy, and not being able to tolerate cold.  This condition is treated with medicine to replace the thyroid hormones that your body does not make. This information  is not intended to replace advice given to you by your health care provider. Make sure you discuss any questions you have with your health care provider. Document Released: 02/08/2005 Document Revised: 01/19/2017 Document Reviewed: 01/19/2017 Elsevier Interactive Patient Education  2019 Elsevier Inc.  

## 2018-05-04 LAB — THYROID PANEL WITH TSH
Free Thyroxine Index: 1.9 (ref 1.2–4.9)
T3 Uptake Ratio: 24 % (ref 24–39)
T4, Total: 8 ug/dL (ref 4.5–12.0)
TSH: 0.708 u[IU]/mL (ref 0.450–4.500)

## 2018-07-16 ENCOUNTER — Other Ambulatory Visit: Payer: Self-pay | Admitting: Unknown Physician Specialty

## 2018-07-18 ENCOUNTER — Other Ambulatory Visit: Payer: Self-pay | Admitting: Family Medicine

## 2018-09-14 ENCOUNTER — Other Ambulatory Visit: Payer: Self-pay | Admitting: Nurse Practitioner

## 2018-09-14 NOTE — Telephone Encounter (Signed)
Forwarding medication refill to PCP for review. 

## 2018-10-11 ENCOUNTER — Other Ambulatory Visit: Payer: Self-pay | Admitting: Nurse Practitioner

## 2018-10-12 ENCOUNTER — Other Ambulatory Visit: Payer: Self-pay

## 2018-10-12 DIAGNOSIS — R6889 Other general symptoms and signs: Secondary | ICD-10-CM | POA: Diagnosis not present

## 2018-10-12 DIAGNOSIS — Z20822 Contact with and (suspected) exposure to covid-19: Secondary | ICD-10-CM

## 2018-10-13 LAB — NOVEL CORONAVIRUS, NAA: SARS-CoV-2, NAA: NOT DETECTED

## 2018-10-27 ENCOUNTER — Other Ambulatory Visit: Payer: Self-pay | Admitting: Family Medicine

## 2018-11-03 ENCOUNTER — Other Ambulatory Visit: Payer: Self-pay

## 2018-11-03 ENCOUNTER — Ambulatory Visit (INDEPENDENT_AMBULATORY_CARE_PROVIDER_SITE_OTHER): Payer: BLUE CROSS/BLUE SHIELD | Admitting: Nurse Practitioner

## 2018-11-03 ENCOUNTER — Encounter: Payer: Self-pay | Admitting: Nurse Practitioner

## 2018-11-03 VITALS — BP 138/84 | HR 67 | Temp 98.1°F

## 2018-11-03 DIAGNOSIS — E039 Hypothyroidism, unspecified: Secondary | ICD-10-CM | POA: Diagnosis not present

## 2018-11-03 DIAGNOSIS — F419 Anxiety disorder, unspecified: Secondary | ICD-10-CM

## 2018-11-03 DIAGNOSIS — Z23 Encounter for immunization: Secondary | ICD-10-CM

## 2018-11-03 DIAGNOSIS — F329 Major depressive disorder, single episode, unspecified: Secondary | ICD-10-CM

## 2018-11-03 DIAGNOSIS — F32A Depression, unspecified: Secondary | ICD-10-CM

## 2018-11-03 NOTE — Assessment & Plan Note (Signed)
Chronic, stable on current regimen.  Denies SI/HI.  Continue current medication regimen and adjust as needed,

## 2018-11-03 NOTE — Progress Notes (Signed)
BP 138/84   Pulse 67   Temp 98.1 F (36.7 C) (Oral)   SpO2 97%    Subjective:    Patient ID: Dominique Kerr, female    DOB: [birth year], 58 y.o.   MRN: [id number]  HPI: Dominique Kerr is a 58 y.o. female  Chief Complaint  Patient presents with  . Depression  . Hypothyroidism   HYPOTHYROIDISM Continues on Synthroid 175 MCG daily.  Last TSH 0.708. Thyroid control status:stable Satisfied with current treatment? yes Medication side effects: no Medication compliance: good compliance Etiology of hypothyroidism: unknown Recent dose adjustment:no Fatigue: no Cold intolerance: no Heat intolerance: no Weight gain: no Weight loss: no Constipation: no Diarrhea/loose stools: no Palpitations: no Lower extremity edema: no Anxiety/depressed mood: no   DEPRESSION Mood status: stable Satisfied with current treatment?: yes Symptom severity: mild  Duration of current treatment : chronic Side effects: no Medication compliance: good compliance Psychotherapy/counseling: yes in the past Previous psychiatric medications: Zoloft Depressed mood: no Anxious mood: no Anhedonia: no Significant weight loss or gain: no Insomnia: none Fatigue: no Feelings of worthlessness or guilt: no Impaired concentration/indecisiveness: no Suicidal ideations: no Hopelessness: no Crying spells: no Depression screen [facility] 2/9 11/03/2018 05/03/2018 01/24/2018 07/13/2017 05/28/2016  Decreased Interest 0 0 0 0 0  Down, Depressed, Hopeless 0 0 0 0 0  PHQ - 2 Score 0 0 0 0 0  Altered sleeping 0 0 0 0 -  Tired, decreased energy 0 1 1 0 -  Change in appetite 0 1 0 0 -  Feeling bad or failure about yourself  0 0 1 1 -  Trouble concentrating 0 0 0 0 -  Moving slowly or fidgety/restless 0 0 0 0 -  Suicidal thoughts 0 0 0 0 -  PHQ-9 Score 0 2 2 1  -  Difficult doing work/chores Not difficult at all Not difficult at all Not difficult at all - -    Relevant past medical, surgical, family and social  history reviewed and updated as indicated. Interim medical history since our last visit reviewed. Allergies and medications reviewed and updated.  Review of Systems  Constitutional: Negative for activity change, appetite change, diaphoresis, fatigue and fever.  Respiratory: Negative for cough, chest tightness and shortness of breath.   Cardiovascular: Negative for chest pain, palpitations and leg swelling.  Gastrointestinal: Negative for abdominal distention, abdominal pain, constipation, diarrhea, nausea and vomiting.  Neurological: Negative for dizziness, syncope, weakness, light-headedness, numbness and headaches.  Psychiatric/Behavioral: Negative.     Per HPI unless specifically indicated above     Objective:    BP 138/84   Pulse 67   Temp 98.1 F (36.7 C) (Oral)   SpO2 97%   Wt Readings from Last 3 Encounters:  05/03/18 220 lb (99.8 kg)  01/24/18 211 lb (95.7 kg)  07/13/17 203 lb (92.1 kg)    Physical Exam Vitals signs and nursing note reviewed.  Constitutional:      General: She is awake. She is not in acute distress.    Appearance: She is well-developed. She is not ill-appearing.  HENT:     Head: Normocephalic.     Right Ear: Hearing normal.     Left Ear: Hearing normal.  Eyes:     General: Lids are normal.        Right eye: No discharge.        Left eye: No discharge.     Conjunctiva/sclera: Conjunctivae normal.     Pupils: Pupils are equal, round, and reactive  to light.  Neck:     Musculoskeletal: Normal range of motion and neck supple.     Thyroid: No thyromegaly.     Vascular: No carotid bruit.  Cardiovascular:     Rate and Rhythm: Normal rate and regular rhythm.     Heart sounds: Normal heart sounds. No murmur. No gallop.   Pulmonary:     Effort: Pulmonary effort is normal. No accessory muscle usage or respiratory distress.     Breath sounds: Normal breath sounds.  Abdominal:     General: Bowel sounds are normal.     Palpations: Abdomen is soft.   Musculoskeletal:     Right lower leg: No edema.     Left lower leg: No edema.  Lymphadenopathy:     Cervical: No cervical adenopathy.  Skin:    General: Skin is warm and dry.  Neurological:     Mental Status: She is alert and oriented to person, place, and time.  Psychiatric:        Attention and Perception: Attention normal.        Mood and Affect: Mood normal.        Speech: Speech normal.        Behavior: Behavior normal. Behavior is cooperative.        Thought Content: Thought content normal.        Judgment: Judgment normal.     Results for orders placed or performed in visit on 10/12/18  Novel Coronavirus, NAA ([facility])   Specimen: Oropharyngeal(OP) collection in vial transport medium   OROPHARYNGEA  TESTING  Result Value Ref Range   SARS-CoV-2, NAA Not Detected Not Detected      Assessment & Plan:   Problem List Items Addressed This Visit      Endocrine   Adult hypothyroidism    Chronic, ongoing.  Continue current medication dose and adjust as needed based on TSH/T4 levels. Labs today.  Return in 6 months for annual physical.      Relevant Orders   Thyroid Panel With TSH     Other   Anxiety and depression - Primary    Chronic, stable on current regimen.  Denies SI/HI.  Continue current medication regimen and adjust as needed,       Other Visit Diagnoses    Need for influenza vaccination       Relevant Orders   Flu Vaccine QUAD 36+ mos IM       Follow up plan: Return in about 6 months (around 05/03/2019) for annual physical.

## 2018-11-03 NOTE — Patient Instructions (Signed)

## 2018-11-03 NOTE — Assessment & Plan Note (Signed)
Chronic, ongoing.  Continue current medication dose and adjust as needed based on TSH/T4 levels. Labs today.  Return in 6 months for annual physical.

## 2018-11-04 LAB — THYROID PANEL WITH TSH
Free Thyroxine Index: 1.8 (ref 1.2–4.9)
T3 Uptake Ratio: 25 % (ref 24–39)
T4, Total: 7 ug/dL (ref 4.5–12.0)
TSH: 0.86 u[IU]/mL (ref 0.450–4.500)

## 2019-01-08 ENCOUNTER — Other Ambulatory Visit: Payer: Self-pay | Admitting: Nurse Practitioner

## 2019-01-10 ENCOUNTER — Other Ambulatory Visit: Payer: Self-pay

## 2019-01-10 DIAGNOSIS — Z20822 Contact with and (suspected) exposure to covid-19: Secondary | ICD-10-CM

## 2019-01-11 LAB — NOVEL CORONAVIRUS, NAA: SARS-CoV-2, NAA: NOT DETECTED

## 2019-01-15 ENCOUNTER — Other Ambulatory Visit: Payer: Self-pay | Admitting: Obstetrics and Gynecology

## 2019-01-15 DIAGNOSIS — Z1231 Encounter for screening mammogram for malignant neoplasm of breast: Secondary | ICD-10-CM | POA: Diagnosis not present

## 2019-01-15 DIAGNOSIS — N904 Leukoplakia of vulva: Secondary | ICD-10-CM | POA: Diagnosis not present

## 2019-01-15 DIAGNOSIS — Z01419 Encounter for gynecological examination (general) (routine) without abnormal findings: Secondary | ICD-10-CM | POA: Diagnosis not present

## 2019-01-23 DIAGNOSIS — M1711 Unilateral primary osteoarthritis, right knee: Secondary | ICD-10-CM | POA: Diagnosis not present

## 2019-02-12 ENCOUNTER — Ambulatory Visit
Admission: RE | Admit: 2019-02-12 | Discharge: 2019-02-12 | Disposition: A | Discharge status: Home / Self Care | Payer: BLUE CROSS/BLUE SHIELD | Source: Ambulatory Visit | Attending: Obstetrics and Gynecology | Admitting: Obstetrics and Gynecology

## 2019-02-12 DIAGNOSIS — Z1231 Encounter for screening mammogram for malignant neoplasm of breast: Secondary | ICD-10-CM | POA: Diagnosis not present

## 2019-03-18 ENCOUNTER — Other Ambulatory Visit: Payer: Self-pay | Admitting: Nurse Practitioner

## 2019-03-18 NOTE — Telephone Encounter (Signed)
Requested Prescriptions  Pending Prescriptions Disp Refills  . SYNTHROID 175 MCG tablet [Pharmacy Med Name: SYNTHROID 175 MCG TABLET] 90 tablet 0    Sig: TAKE 1 TABLET (175 MCG TOTAL) BY MOUTH DAILY BEFORE BREAKFAST.     Endocrinology:  Hypothyroid Agents Failed - 03/18/2019  9:31 AM      Failed - TSH needs to be rechecked within 3 months after an abnormal result. Refill until TSH is due.      Passed - TSH in normal range and within 360 days    TSH  Date Value Ref Range Status  11/03/2018 0.860 0.450 - 4.500 uIU/mL Final         Passed - Valid encounter within last 12 months    Recent Outpatient Visits          4 months ago Anxiety and depression   [place], Barbaraann Faster, NP   10 months ago Adult hypothyroidism   [place], Barbaraann Faster, NP   1 year ago Adult hypothyroidism   [facility] [place], Barbaraann Faster, NP   1 year ago Routine general medical examination at a health care facility   [place], NP   2 years ago IFG (impaired fasting glucose)   [facility] Kathrine Haddock, NP      Future Appointments            In 1 month [place], Barbaraann Faster, NP [facility]

## 2019-04-08 ENCOUNTER — Other Ambulatory Visit: Payer: Self-pay | Admitting: Nurse Practitioner

## 2019-04-08 NOTE — Telephone Encounter (Signed)
Requested Prescriptions  Pending Prescriptions Disp Refills  . sertraline (ZOLOFT) 100 MG tablet [Pharmacy Med Name: SERTRALINE HCL 100 MG TABLET] 90 tablet 0    Sig: TAKE 1 TABLET BY MOUTH EVERY DAY     Psychiatry:  Antidepressants - SSRI Passed - 04/08/2019  9:42 AM      Passed - Completed PHQ-2 or PHQ-9 in the last 360 days.      Passed - Valid encounter within last 6 months    Recent Outpatient Visits          5 months ago Anxiety and depression   [place] [facility], Barbaraann Faster, NP   11 months ago Adult hypothyroidism   [place], Barbaraann Faster, NP   1 year ago Adult hypothyroidism   [facility] [place], Barbaraann Faster, NP   1 year ago Routine general medical examination at a health care facility   [place], NP   2 years ago IFG (impaired fasting glucose)   [facility] Kathrine Haddock, NP      Future Appointments            In 1 month Cann[place], Barbaraann Faster, NP [employer] [facility]

## 2019-05-05 ENCOUNTER — Encounter: Payer: Self-pay | Admitting: Nurse Practitioner

## 2019-05-08 ENCOUNTER — Other Ambulatory Visit: Payer: Self-pay

## 2019-05-08 ENCOUNTER — Ambulatory Visit (INDEPENDENT_AMBULATORY_CARE_PROVIDER_SITE_OTHER): Payer: BC Managed Care – PPO | Admitting: Nurse Practitioner

## 2019-05-08 ENCOUNTER — Encounter: Payer: Self-pay | Admitting: Nurse Practitioner

## 2019-05-08 VITALS — BP 131/85 | HR 72 | Temp 98.9°F | Ht 65.5 in | Wt 221.0 lb

## 2019-05-08 DIAGNOSIS — J301 Allergic rhinitis due to pollen: Secondary | ICD-10-CM

## 2019-05-08 DIAGNOSIS — Z1231 Encounter for screening mammogram for malignant neoplasm of breast: Secondary | ICD-10-CM

## 2019-05-08 DIAGNOSIS — E7800 Pure hypercholesterolemia, unspecified: Secondary | ICD-10-CM

## 2019-05-08 DIAGNOSIS — F329 Major depressive disorder, single episode, unspecified: Secondary | ICD-10-CM

## 2019-05-08 DIAGNOSIS — F419 Anxiety disorder, unspecified: Secondary | ICD-10-CM

## 2019-05-08 DIAGNOSIS — Z0000 Encounter for general adult medical examination without abnormal findings: Secondary | ICD-10-CM

## 2019-05-08 DIAGNOSIS — Z79890 Hormone replacement therapy (postmenopausal): Secondary | ICD-10-CM

## 2019-05-08 DIAGNOSIS — K219 Gastro-esophageal reflux disease without esophagitis: Secondary | ICD-10-CM | POA: Diagnosis not present

## 2019-05-08 DIAGNOSIS — F32A Depression, unspecified: Secondary | ICD-10-CM

## 2019-05-08 DIAGNOSIS — E039 Hypothyroidism, unspecified: Secondary | ICD-10-CM | POA: Diagnosis not present

## 2019-05-08 DIAGNOSIS — R7301 Impaired fasting glucose: Secondary | ICD-10-CM

## 2019-05-08 DIAGNOSIS — E559 Vitamin D deficiency, unspecified: Secondary | ICD-10-CM

## 2019-05-08 MED ORDER — SERTRALINE HCL 100 MG PO TABS: 100.0000 mg | ORAL_TABLET | Freq: Every day | ORAL | 4 refills | Status: DC

## 2019-05-08 MED ORDER — SYNTHROID 175 MCG PO TABS: ORAL_TABLET | ORAL | 4 refills | Status: DC

## 2019-05-08 NOTE — Assessment & Plan Note (Signed)
Ongoing, continue supplement and check Vit D level today. ?

## 2019-05-08 NOTE — Assessment & Plan Note (Signed)
Chronic, stable with occasional use of Prilosec.  Continue current medication and adjust as needed.  Check Mag level.

## 2019-05-08 NOTE — Assessment & Plan Note (Addendum)
Chronic, stable on current regimen.  Denies SI/HI.  Continue current medication regimen and adjust as needed.  Refills sent. 

## 2019-05-08 NOTE — Patient Instructions (Signed)
Hypothyroidism  Hypothyroidism is when the thyroid gland does not make enough of certain hormones (it is underactive). The thyroid gland is a small gland located in the lower front part of the neck, just in front of the windpipe (trachea). This gland makes hormones that help control how the body uses food for energy (metabolism) as well as how the heart and brain function. These hormones also play a role in keeping your bones strong. When the thyroid is underactive, it produces too little of the hormones thyroxine (T4) and triiodothyronine (T3). What are the causes? This condition may be caused by:  Hashimoto's disease. This is a disease in which the body's disease-fighting system (immune system) attacks the thyroid gland. This is the most common cause.  Viral infections.  Pregnancy.  Certain medicines.  Birth defects.  Past radiation treatments to the head or neck for cancer.  Past treatment with radioactive iodine.  Past exposure to radiation in the environment.  Past surgical removal of part or all of the thyroid.  Problems with a gland in the center of the brain (pituitary gland).  Lack of enough iodine in the diet. What increases the risk? You are more likely to develop this condition if:  You are female.  You have a family history of thyroid conditions.  You use a medicine called lithium.  You take medicines that affect the immune system (immunosuppressants). What are the signs or symptoms? Symptoms of this condition include:  Feeling as though you have no energy (lethargy).  Not being able to tolerate cold.  Weight gain that is not explained by a change in diet or exercise habits.  Lack of appetite.  Dry skin.  Coarse hair.  Menstrual irregularity.  Slowing of thought processes.  Constipation.  Sadness or depression. How is this diagnosed? This condition may be diagnosed based on:  Your symptoms, your medical history, and a physical exam.  Blood  tests. You may also have imaging tests, such as an ultrasound or MRI. How is this treated? This condition is treated with medicine that replaces the thyroid hormones that your body does not make. After you begin treatment, it may take several weeks for symptoms to go away. Follow these instructions at home:  Take over-the-counter and prescription medicines only as told by your health care provider.  If you start taking any new medicines, tell your health care provider.  Keep all follow-up visits as told by your health care provider. This is important. ? As your condition improves, your dosage of thyroid hormone medicine may change. ? You will need to have blood tests regularly so that your health care provider can monitor your condition. Contact a health care provider if:  Your symptoms do not get better with treatment.  You are taking thyroid replacement medicine and you: ? Sweat a lot. ? Have tremors. ? Feel anxious. ? Lose weight rapidly. ? Cannot tolerate heat. ? Have emotional swings. ? Have diarrhea. ? Feel weak. Get help right away if you have:  Chest pain.  An irregular heartbeat.  A rapid heartbeat.  Difficulty breathing. Summary  Hypothyroidism is when the thyroid gland does not make enough of certain hormones (it is underactive).  When the thyroid is underactive, it produces too little of the hormones thyroxine (T4) and triiodothyronine (T3).  The most common cause is Hashimoto's disease, a disease in which the body's disease-fighting system (immune system) attacks the thyroid gland. The condition can also be caused by viral infections, medicine, pregnancy, or past   radiation treatment to the head or neck.  Symptoms may include weight gain, dry skin, constipation, feeling as though you do not have energy, and not being able to tolerate cold.  This condition is treated with medicine to replace the thyroid hormones that your body does not make. This information  is not intended to replace advice given to you by your health care provider. Make sure you discuss any questions you have with your health care provider. Document Revised: 01/21/2017 Document Reviewed: 01/19/2017 Elsevier Patient Education  2020 Elsevier Inc.  

## 2019-05-08 NOTE — Assessment & Plan Note (Signed)
Recent labs show glucose 80-90 range.

## 2019-05-08 NOTE — Assessment & Plan Note (Signed)
Followed by GYN, continue current regimen as prescribed by them. 

## 2019-05-08 NOTE — Assessment & Plan Note (Signed)
Check lipid panel today and initiate meds as needed.  Current ASCVD 2.5%.

## 2019-05-08 NOTE — Assessment & Plan Note (Signed)
Chronic, stable with occasional Zyrtec use.  Continue current medication regimen and adjust as needed based on symptoms.

## 2019-05-08 NOTE — Assessment & Plan Note (Signed)
Chronic, ongoing.  Continue current medication dose and adjust as needed based on TSH/T4 levels. Labs today.  Return in 6 months for follow-up.

## 2019-05-08 NOTE — Progress Notes (Signed)
BP 131/85   Pulse 72   Temp 98.9 F (37.2 C) (Oral)   Ht 5' 5.5" (1.664 m)   Wt 221 lb (100.2 kg)   SpO2 95%   BMI 36.22 kg/m    Subjective:    Patient ID: Dominique Kerr, female    DOB: [birth year], 59 y.o.   MRN: [id number]  HPI: Dominique Kerr is a 59 y.o. female presenting on 05/08/2019 for comprehensive medical examination. Current medical complaints include:none  She currently lives with: sister Menopausal Symptoms: no   HYPOTHYROIDISM Continues on Synthroid 175 MCG daily.  Last TSH 0.860. Thyroid control status:stable Satisfied with current treatment? yes Medication side effects: no Medication compliance: good compliance Etiology of hypothyroidism: unknown Recent dose adjustment:no Fatigue: no Cold intolerance: no Heat intolerance: no Weight gain: no Weight loss: no Constipation: no Diarrhea/loose stools: no Palpitations: no Lower extremity edema: no Anxiety/depressed mood: no   HYPERLIPIDEMIA No current medications. Hyperlipidemia status: good compliance Supplements: none Aspirin:  yes The 10-year ASCVD risk score Mikey Bussing DC Jr., et al., 2013) is: 2.5%   Values used to calculate the score:     Age: 14 years     Sex: Female     Is Non-Hispanic African American: No     Diabetic: No     Tobacco smoker: No     Systolic Blood Pressure: A999333 mmHg     Is BP treated: No     HDL Cholesterol: 74 mg/dL     Total Cholesterol: 284 mg/dL Chest pain:  no Coronary artery disease:  no Family history CAD:  no Family history early CAD:  no  DEPRESSION Continues on Sertraline 100 MG. Mood status: stable Satisfied with current treatment?: yes Symptom severity: mild  Duration of current treatment : chronic Side effects: no Medication compliance: good compliance Psychotherapy/counseling: yes in the past Previous psychiatric medications: Zoloft Depressed mood: no Anxious mood: no Anhedonia: no Significant weight loss or gain: no Insomnia:  none Fatigue: no Feelings of worthlessness or guilt: no Impaired concentration/indecisiveness: no Suicidal ideations: no Hopelessness: no Crying spells: no Depression screen [facility] 2/9 05/08/2019 11/03/2018 05/03/2018 01/24/2018 07/13/2017  Decreased Interest 0 0 0 0 0  Down, Depressed, Hopeless 1 0 0 0 0  PHQ - 2 Score 1 0 0 0 0  Altered sleeping 0 0 0 0 0  Tired, decreased energy 0 0 1 1 0  Change in appetite 1 0 1 0 0  Feeling bad or failure about yourself  0 0 0 1 1  Trouble concentrating 0 0 0 0 0  Moving slowly or fidgety/restless 0 0 0 0 0  Suicidal thoughts 0 0 0 0 0  PHQ-9 Score 2 0 2 2 1   Difficult doing work/chores Not difficult at all Not difficult at all Not difficult at all Not difficult at all -    The patient does not have a history of falls. I did not complete a risk assessment for falls. A plan of care for falls was not documented.   Past Medical History:  Past Medical History:  Diagnosis Date  . Allergy   . Anxiety   . Diabetes mellitus without complication ([place])   . HSV (herpes simplex virus) infection   . Hyperlipidemia   . Hypertension   . Obesity   . PMDD (premenstrual dysphoric disorder)   . Thyroid disease     Surgical History:  Past Surgical History:  Procedure Laterality Date  . CHOLECYSTECTOMY    . KNEE ARTHROSCOPY    .  LAPAROSCOPIC GASTRIC SLEEVE RESECTION    . parotid gland removal      Medications:  Current Outpatient Medications on File Prior to Visit  Medication Sig  . aspirin EC 81 MG tablet Take 81 mg by mouth daily.   . cetirizine (ZYRTEC) 10 MG tablet Take 10 mg by mouth daily as needed for allergies.  . Cholecalciferol (VITAMIN D3) 1000 units CAPS Take 1,000 Units by mouth daily.  Marland Kitchen estradiol (CLIMARA - DOSED IN MG/24 HR) 0.05 mg/24hr patch Place 0.05 mg onto the skin once a week.  . Multiple Vitamin (MULTIVITAMIN WITH MINERALS) TABS tablet Take 1 tablet by mouth daily.  Marland Kitchen omeprazole (PRILOSEC) 20 MG capsule TAKE 1 CAPSULE BY MOUTH  EVERY DAY  . progesterone (PROMETRIUM) 200 MG capsule Take 200 mg by mouth at bedtime.   No current facility-administered medications on file prior to visit.    Allergies:  Allergies  Allergen Reactions  . Lisinopril Cough    Social History:  Social History   Socioeconomic History  . Marital status: Single    Spouse name: Not on file  . Number of children: Not on file  . Years of education: Not on file  . Highest education level: Not on file  Occupational History  . Not on file  Tobacco Use  . Smoking status: Former Research scientist (life sciences)  . Smokeless tobacco: Never Used  Substance and Sexual Activity  . Alcohol use: Yes    Alcohol/week: 0.0 standard drinks    Comment: on occasion  . Drug use: No  . Sexual activity: Never  Other Topics Concern  . Not on file  Social History Narrative  . Not on file   Social Determinants of Health   Financial Resource Strain:   . Difficulty of Paying Living Expenses:   Food Insecurity:   . Worried About Charity fundraiser in the Last Year:   . Arboriculturist in the Last Year:   Transportation Needs:   . Film/video editor (Medical):   Marland Kitchen Lack of Transportation (Non-Medical):   Physical Activity:   . Days of Exercise per Week:   . Minutes of Exercise per Session:   Stress:   . Feeling of Stress :   Social Connections:   . Frequency of Communication with Friends and Family:   . Frequency of Social Gatherings with Friends and Family:   . Attends Religious Services:   . Active Member of Clubs or Organizations:   . Attends Archivist Meetings:   Marland Kitchen Marital Status:   Intimate Partner Violence:   . Fear of Current or Ex-Partner:   . Emotionally Abused:   Marland Kitchen Physically Abused:   . Sexually Abused:    Social History   Tobacco Use  Smoking Status Former Smoker  Smokeless Tobacco Never Used   Social History   Substance and Sexual Activity  Alcohol Use Yes  . Alcohol/week: 0.0 standard drinks   Comment: on occasion     Family History:  Family History  Problem Relation Age of Onset  . Diabetes Mother   . Hepatitis C Mother   . Arthritis Sister   . Diabetes Maternal Grandmother     Past medical history, surgical history, medications, allergies, family history and social history reviewed with patient today and changes made to appropriate areas of the chart.   Review of Systems - negative All other ROS negative except what is listed above and in the HPI.      Objective:  BP 131/85   Pulse 72   Temp 98.9 F (37.2 C) (Oral)   Ht 5' 5.5" (1.664 m)   Wt 221 lb (100.2 kg)   SpO2 95%   BMI 36.22 kg/m   Wt Readings from Last 3 Encounters:  05/08/19 221 lb (100.2 kg)  05/03/18 220 lb (99.8 kg)  01/24/18 211 lb (95.7 kg)    Physical Exam Constitutional:      General: She is awake. She is not in acute distress.    Appearance: She is well-developed. She is not ill-appearing.  HENT:     Head: Normocephalic and atraumatic.     Right Ear: Hearing, tympanic membrane, ear canal and external ear normal. No drainage.     Left Ear: Hearing, tympanic membrane, ear canal and external ear normal. No drainage.     Nose: Nose normal.     Right Sinus: No maxillary sinus tenderness or frontal sinus tenderness.     Left Sinus: No maxillary sinus tenderness or frontal sinus tenderness.     Mouth/Throat:     Mouth: Mucous membranes are moist.     Pharynx: Oropharynx is clear. Uvula midline. No pharyngeal swelling, oropharyngeal exudate or posterior oropharyngeal erythema.  Eyes:     General: Lids are normal.        Right eye: No discharge.        Left eye: No discharge.     Extraocular Movements: Extraocular movements intact.     Conjunctiva/sclera: Conjunctivae normal.     Pupils: Pupils are equal, round, and reactive to light.     Visual Fields: Right eye visual fields normal and left eye visual fields normal.  Neck:     Thyroid: No thyromegaly.     Vascular: No carotid bruit.     Trachea: Trachea  normal.  Cardiovascular:     Rate and Rhythm: Normal rate and regular rhythm.     Heart sounds: Normal heart sounds. No murmur. No gallop.   Pulmonary:     Effort: Pulmonary effort is normal. No accessory muscle usage or respiratory distress.     Breath sounds: Normal breath sounds.  Abdominal:     General: Bowel sounds are normal.     Palpations: Abdomen is soft. There is no hepatomegaly or splenomegaly.     Tenderness: There is no abdominal tenderness.  Musculoskeletal:        General: Normal range of motion.     Cervical back: Normal range of motion and neck supple.     Right lower leg: No edema.     Left lower leg: No edema.  Lymphadenopathy:     Head:     Right side of head: No submental, submandibular, tonsillar, preauricular or posterior auricular adenopathy.     Left side of head: No submental, submandibular, tonsillar, preauricular or posterior auricular adenopathy.     Cervical: No cervical adenopathy.  Skin:    General: Skin is warm and dry.     Capillary Refill: Capillary refill takes less than 2 seconds.     Findings: No rash.  Neurological:     Mental Status: She is alert and oriented to person, place, and time.     Cranial Nerves: Cranial nerves are intact.     Gait: Gait is intact.     Deep Tendon Reflexes: Reflexes are normal and symmetric.     Reflex Scores:      Brachioradialis reflexes are 2+ on the right side and 2+ on the left side.  Patellar reflexes are 2+ on the right side and 2+ on the left side. Psychiatric:        Attention and Perception: Attention normal.        Mood and Affect: Mood normal.        Speech: Speech normal.        Behavior: Behavior normal. Behavior is cooperative.        Thought Content: Thought content normal.        Judgment: Judgment normal.     Results for orders placed or performed in visit on 01/10/19  Novel Coronavirus, NAA ([facility])   Specimen: Nasopharyngeal(NP) swabs in vial transport medium   NASOPHARYNGE   TESTING  Result Value Ref Range   SARS-CoV-2, NAA Not Detected Not Detected      Assessment & Plan:   Problem List Items Addressed This Visit      Respiratory   Allergic rhinitis    Chronic, stable with occasional Zyrtec use.  Continue current medication regimen and adjust as needed based on symptoms.        Digestive   GERD (gastroesophageal reflux disease)    Chronic, stable with occasional use of Prilosec.  Continue current medication and adjust as needed.  Check Mag level.      Relevant Orders   Magnesium     Endocrine   Adult hypothyroidism    Chronic, ongoing.  Continue current medication dose and adjust as needed based on TSH/T4 levels. Labs today.  Return in 6 months for follow-up.      Relevant Medications   SYNTHROID 175 MCG tablet   Other Relevant Orders   Thyroid Panel With TSH   IFG (impaired fasting glucose)    Recent labs show glucose 80-90 range.        Other   HLD (hyperlipidemia)    Check lipid panel today and initiate meds as needed.  Current ASCVD 2.5%.      Relevant Orders   Comprehensive metabolic panel   Lipid Panel w/o Chol/HDL Ratio   Anxiety and depression    Chronic, stable on current regimen.  Denies SI/HI.  Continue current medication regimen and adjust as needed.  Refills sent.      Relevant Medications   sertraline (ZOLOFT) 100 MG tablet   Vitamin D deficiency    Ongoing, continue supplement and check Vit D level today.      Relevant Orders   VITAMIN D 25 Hydroxy (Vit-D Deficiency, Fractures)   Postmenopausal HRT (hormone replacement therapy)    Followed by GYN, continue current regimen as prescribed by them.       Other Visit Diagnoses    Annual physical exam    -  Primary   Annual labs today to include CBC, CMP, TSH, lipid   Relevant Orders   CBC with Differential/Platelet       Follow up plan: Return in about 6 months (around 11/08/2019) for Hypothyroid and mood.   LABORATORY TESTING:  - Pap smear: up to  date  IMMUNIZATIONS:   - Tdap: Tetanus vaccination status reviewed: last tetanus booster within 10 years. - Influenza: Up to date - Pneumovax: Not applicable - Prevnar: Not applicable - HPV: Not applicable - Zostavax vaccine: Not applicable  SCREENING: -Mammogram: Up to date  - Colonoscopy: Up to date  - Bone Density: Not applicable  -Hearing Test: Not applicable  -Spirometry: Not applicable   PATIENT COUNSELING:   Advised to take 1 mg of folate supplement per day if capable of pregnancy.  Sexuality: Discussed sexually transmitted diseases, partner selection, use of condoms, avoidance of unintended pregnancy  and contraceptive alternatives.   Advised to avoid cigarette smoking.  I discussed with the patient that most people either abstain from alcohol or drink within safe limits (<=14/week and <=4 drinks/occasion for males, <=7/weeks and <= 3 drinks/occasion for females) and that the risk for alcohol disorders and other health effects rises proportionally with the number of drinks per week and how often a drinker exceeds daily limits.  Discussed cessation/primary prevention of drug use and availability of treatment for abuse.   Diet: Encouraged to adjust caloric intake to maintain  or achieve ideal body weight, to reduce intake of dietary saturated fat and total fat, to limit sodium intake by avoiding high sodium foods and not adding table salt, and to maintain adequate dietary potassium and calcium preferably from fresh fruits, vegetables, and low-fat dairy products.    stressed the importance of regular exercise  Injury prevention: Discussed safety belts, safety helmets, smoke detector, smoking near bedding or upholstery.   Dental health: Discussed importance of regular tooth brushing, flossing, and dental visits.    NEXT PREVENTATIVE PHYSICAL DUE IN 1 YEAR. Return in about 6 months (around 11/08/2019) for Hypothyroid and mood.

## 2019-05-09 LAB — COMPREHENSIVE METABOLIC PANEL
ALT: 13 IU/L (ref 0–32)
AST: 18 IU/L (ref 0–40)
Albumin/Globulin Ratio: 1.7 (ref 1.2–2.2)
Albumin: 4.3 g/dL (ref 3.8–4.9)
Alkaline Phosphatase: 76 IU/L (ref 39–117)
BUN/Creatinine Ratio: 21 (ref 9–23)
BUN: 14 mg/dL (ref 6–24)
Bilirubin Total: 0.3 mg/dL (ref 0.0–1.2)
CO2: 24 mmol/L (ref 20–29)
Calcium: 9.2 mg/dL (ref 8.7–10.2)
Chloride: 103 mmol/L (ref 96–106)
Creatinine, Ser: 0.66 mg/dL (ref 0.57–1.00)
GFR calc Af Amer: 114 mL/min/{1.73_m2} (ref >59)
GFR calc non Af Amer: 99 mL/min/{1.73_m2} (ref >59)
Globulin, Total: 2.6 g/dL (ref 1.5–4.5)
Glucose: 91 mg/dL (ref 65–99)
Potassium: 4.3 mmol/L (ref 3.5–5.2)
Sodium: 141 mmol/L (ref 134–144)
Total Protein: 6.9 g/dL (ref 6.0–8.5)

## 2019-05-09 LAB — CBC WITH DIFFERENTIAL/PLATELET
Basophils Absolute: 0 10*3/uL (ref 0.0–0.2)
Basos: 1 %
EOS (ABSOLUTE): 0.2 10*3/uL (ref 0.0–0.4)
Eos: 5 %
Hematocrit: 37.9 % (ref 34.0–46.6)
Hemoglobin: 12.3 g/dL (ref 11.1–15.9)
Immature Grans (Abs): 0 10*3/uL (ref 0.0–0.1)
Immature Granulocytes: 0 %
Lymphocytes Absolute: 1.3 10*3/uL (ref 0.7–3.1)
Lymphs: 29 %
MCH: 27.8 pg (ref 26.6–33.0)
MCHC: 32.5 g/dL (ref 31.5–35.7)
MCV: 86 fL (ref 79–97)
Monocytes Absolute: 0.3 10*3/uL (ref 0.1–0.9)
Monocytes: 7 %
Neutrophils Absolute: 2.5 10*3/uL (ref 1.4–7.0)
Neutrophils: 58 %
Platelets: 340 10*3/uL (ref 150–450)
RBC: 4.42 x10E6/uL (ref 3.77–5.28)
RDW: 13.3 % (ref 11.7–15.4)
WBC: 4.3 10*3/uL (ref 3.4–10.8)

## 2019-05-09 LAB — LIPID PANEL W/O CHOL/HDL RATIO
Cholesterol, Total: 293 mg/dL — ABNORMAL HIGH (ref 100–199)
HDL: 81 mg/dL (ref >39)
LDL Chol Calc (NIH): 195 mg/dL — ABNORMAL HIGH (ref 0–99)
Triglycerides: 99 mg/dL (ref 0–149)
VLDL Cholesterol Cal: 17 mg/dL (ref 5–40)

## 2019-05-09 LAB — THYROID PANEL WITH TSH
Free Thyroxine Index: 2.1 (ref 1.2–4.9)
T3 Uptake Ratio: 27 % (ref 24–39)
T4, Total: 7.7 ug/dL (ref 4.5–12.0)
TSH: 0.295 u[IU]/mL — ABNORMAL LOW (ref 0.450–4.500)

## 2019-05-09 LAB — MAGNESIUM: Magnesium: 2.3 mg/dL (ref 1.6–2.3)

## 2019-05-09 LAB — VITAMIN D 25 HYDROXY (VIT D DEFICIENCY, FRACTURES): Vit D, 25-Hydroxy: 32.8 ng/mL (ref 30.0–100.0)

## 2019-05-09 NOTE — Progress Notes (Signed)
Contacted via MyChart The 10-year ASCVD risk score Mikey Bussing DC Jr., et al., 2013) is: 2.4%   Values used to calculate the score:     Age: 59 years     Sex: Female     Is Non-Hispanic African American: No     Diabetic: No     Tobacco smoker: No     Systolic Blood Pressure: A999333 mmHg     Is BP treated: No     HDL Cholesterol: 81 mg/dL     Total Cholesterol: 293 mg/dL

## 2019-05-10 ENCOUNTER — Telehealth: Payer: Self-pay | Admitting: Nurse Practitioner

## 2019-05-10 DIAGNOSIS — E039 Hypothyroidism, unspecified: Secondary | ICD-10-CM

## 2019-05-10 NOTE — Telephone Encounter (Signed)
Spoke to patient on phone, she was unable to respond to [employer].  Would like to continue current Levothyroxine dose and recheck in 4 weeks, adjust then if continues on low side.  Would like to focus on diet for cholesterol and not start medication at this time.

## 2019-05-11 NOTE — Telephone Encounter (Signed)
Called pt to schedule lab visit, no answer, left vm

## 2019-05-31 ENCOUNTER — Other Ambulatory Visit: Payer: BC Managed Care – PPO

## 2019-05-31 ENCOUNTER — Other Ambulatory Visit: Payer: Self-pay

## 2019-05-31 DIAGNOSIS — E039 Hypothyroidism, unspecified: Secondary | ICD-10-CM | POA: Diagnosis not present

## 2019-06-01 LAB — THYROID PANEL WITH TSH
Free Thyroxine Index: 2 (ref 1.2–4.9)
T3 Uptake Ratio: 26 % (ref 24–39)
T4, Total: 7.6 ug/dL (ref 4.5–12.0)
TSH: 0.512 u[IU]/mL (ref 0.450–4.500)

## 2019-06-01 NOTE — Progress Notes (Signed)
Contacted via MyChart

## 2019-06-14 ENCOUNTER — Other Ambulatory Visit: Payer: Self-pay | Admitting: Family Medicine

## 2019-08-01 DIAGNOSIS — N904 Leukoplakia of vulva: Secondary | ICD-10-CM | POA: Diagnosis not present

## 2019-08-30 ENCOUNTER — Other Ambulatory Visit: Payer: Self-pay | Admitting: Nurse Practitioner

## 2019-08-30 MED ORDER — LEVOTHYROXINE SODIUM 100 MCG PO TABS: 100.0000 ug | ORAL_TABLET | Freq: Every day | ORAL | 3 refills | Status: DC

## 2019-08-30 MED ORDER — LEVOTHYROXINE SODIUM 75 MCG PO TABS
75.0000 ug | ORAL_TABLET | Freq: Every day | ORAL | 3 refills | Status: DC
Start: 2019-08-30 — End: 2019-11-13

## 2019-08-30 NOTE — Telephone Encounter (Signed)
Requested medication (s) are due for refill today:  No  Requested medication (s) are on the active medication list:  Yes  Future visit scheduled:  Yes  Last Refill: 05/08/19; #90; RF x 4  Notes to Clinic:  Phone call to pharmacy to question discrepancy in the requested dose compared to current dose pt. Is taking (175 mcg.)  Per Pharmacist, he could not explain the automated request for a random dose, however, he stated her insurance will not cover the brand Synthroid.  Will need proof of medical necessity, or Prior Auth. for insurance to approve the brand.  Please advise.   Requested Prescriptions  Pending Prescriptions Disp Refills   levothyroxine (SYNTHROID) 25 MCG tablet [Pharmacy Med Name: LEVOTHYROXINE 25 MCG TABLET]  0      Endocrinology:  Hypothyroid Agents Failed - 08/30/2019  9:29 AM      Failed - TSH needs to be rechecked within 3 months after an abnormal result. Refill until TSH is due.      Passed - TSH in normal range and within 360 days    TSH  Date Value Ref Range Status  05/31/2019 0.512 0.450 - 4.500 uIU/mL Final          Passed - Valid encounter within last 12 months    Recent Outpatient Visits           3 months ago Annual physical exam   [employer] Cannady, Barbaraann Faster, NP   10 months ago Anxiety and depression   [place], Barbaraann Faster, NP   1 year ago Adult hypothyroidism   [facility] [place], Barbaraann Faster, NP   1 year ago Adult hypothyroidism   [facility] [place], Henrine Screws T, NP   2 years ago Routine general medical examination at a health care facility   [facility] Kathrine Haddock, NP       Future Appointments             In 2 months Cannady, Barbaraann Faster, NP [employer]

## 2019-11-12 ENCOUNTER — Other Ambulatory Visit: Payer: Self-pay

## 2019-11-12 ENCOUNTER — Ambulatory Visit (INDEPENDENT_AMBULATORY_CARE_PROVIDER_SITE_OTHER): Payer: BC Managed Care – PPO | Admitting: Family Medicine

## 2019-11-12 ENCOUNTER — Ambulatory Visit: Payer: BC Managed Care – PPO | Admitting: Nurse Practitioner

## 2019-11-12 ENCOUNTER — Encounter: Payer: Self-pay | Admitting: Family Medicine

## 2019-11-12 VITALS — BP 142/85 | HR 60 | Temp 98.2°F | Wt 206.0 lb

## 2019-11-12 DIAGNOSIS — Z23 Encounter for immunization: Secondary | ICD-10-CM | POA: Diagnosis not present

## 2019-11-12 DIAGNOSIS — E559 Vitamin D deficiency, unspecified: Secondary | ICD-10-CM

## 2019-11-12 DIAGNOSIS — E039 Hypothyroidism, unspecified: Secondary | ICD-10-CM

## 2019-11-12 DIAGNOSIS — F32A Depression, unspecified: Secondary | ICD-10-CM

## 2019-11-12 DIAGNOSIS — R7301 Impaired fasting glucose: Secondary | ICD-10-CM | POA: Diagnosis not present

## 2019-11-12 DIAGNOSIS — E7800 Pure hypercholesterolemia, unspecified: Secondary | ICD-10-CM

## 2019-11-12 DIAGNOSIS — F329 Major depressive disorder, single episode, unspecified: Secondary | ICD-10-CM

## 2019-11-12 DIAGNOSIS — F419 Anxiety disorder, unspecified: Secondary | ICD-10-CM

## 2019-11-12 LAB — BAYER DCA HB A1C WAIVED: HB A1C (BAYER DCA - WAIVED): 5.7 % (ref <7.0)

## 2019-11-12 NOTE — Assessment & Plan Note (Signed)
Under good control on current regimen. Continue current regimen. Continue to monitor. Call with any concerns. Refills up to date.   

## 2019-11-12 NOTE — Assessment & Plan Note (Signed)
Rechecking levels today. Await results. Treat as needed.  

## 2019-11-12 NOTE — Progress Notes (Signed)
BP (!) 142/85   Pulse 60   Temp 98.2 F (36.8 C) (Oral)   Wt 206 lb (93.4 kg)   SpO2 96%   BMI 33.76 kg/m    Subjective:    Patient ID: Dominique Kerr, female    DOB: [birth year], 59 y.o.   MRN: [id number]  HPI: Dominique Kerr is a 59 y.o. female  Chief Complaint  Patient presents with  . Hypothyroidism  . Anxiety  . Depression   Impaired Fasting Glucose HbA1C:  Lab Results  Component Value Date   HGBA1C 5.9 11/28/2015   Duration of elevated blood sugar: chronic Polydipsia: no Polyuria: no Weight change: no Visual disturbance: no Glucose Monitoring: no    Accucheck frequency: Not Checking    Fasting glucose:     Post prandial:  Diabetic Education: Not Completed Family history of diabetes: yes  HYPERLIPIDEMIA Hyperlipidemia status: stable Satisfied with current treatment?  yes Side effects:  Not on anything Past cholesterol meds: none Supplements: none Aspirin:  no The 10-year ASCVD risk score Mikey Bussing DC Jr., et al., 2013) is: 3.1%   Values used to calculate the score:     Age: 7 years     Sex: Female     Is Non-Hispanic African American: No     Diabetic: No     Tobacco smoker: No     Systolic Blood Pressure: 659 mmHg     Is BP treated: No     HDL Cholesterol: 81 mg/dL     Total Cholesterol: 293 mg/dL Chest pain:  no Coronary artery disease:  no  HYPOTHYROIDISM Thyroid control status:stable Satisfied with current treatment? yes Medication side effects: no Medication compliance: excellent compliance Recent dose adjustment:no Fatigue: no Cold intolerance: no Heat intolerance: no Weight gain: no Weight loss: no Constipation: no Diarrhea/loose stools: no Palpitations: no Lower extremity edema: no Anxiety/depressed mood: no  ANXIETY/DEPRESSION Duration:stable Anxious mood: no  Excessive worrying: no Irritability: no  Sweating: no Nausea: no Palpitations:no Hyperventilation: no Panic attacks: no Agoraphobia: no   Obscessions/compulsions: no Depressed mood: no Depression screen [facility] 2/9 11/12/2019 05/08/2019 11/03/2018 05/03/2018 01/24/2018  Decreased Interest 0 0 0 0 0  Down, Depressed, Hopeless 1 1 0 0 0  PHQ - 2 Score 1 1 0 0 0  Altered sleeping 0 0 0 0 0  Tired, decreased energy 1 0 0 1 1  Change in appetite 0 1 0 1 0  Feeling bad or failure about yourself  0 0 0 0 1  Trouble concentrating 0 0 0 0 0  Moving slowly or fidgety/restless 0 0 0 0 0  Suicidal thoughts 0 0 0 0 0  PHQ-9 Score 2 2 0 2 2  Difficult doing work/chores Not difficult at all Not difficult at all Not difficult at all Not difficult at all Not difficult at all   GAD 7 : Generalized Anxiety Score 11/12/2019 05/08/2019 05/03/2018 01/24/2018  Nervous, Anxious, on Edge 1 1 1 1   Control/stop worrying 1 1 0 1  Worry too much - different things 1 0 1 1  Trouble relaxing 0 0 0 0  Restless 0 0 0 0  Easily annoyed or irritable 1 1 1 1   Afraid - awful might happen 0 0 0 0  Total GAD 7 Score 4 3 3 4   Anxiety Difficulty Not difficult at all Not difficult at all Not difficult at all -   Anhedonia: no Weight changes: no Insomnia: no   Hypersomnia: no Fatigue/loss of energy: no  Feelings of worthlessness: no Feelings of guilt: no Impaired concentration/indecisiveness: no Suicidal ideations: no  Crying spells: no Recent Stressors/Life Changes: no   Relationship problems: no   Family stress: no     Financial stress: no    Job stress: no    Recent death/loss: no  Relevant past medical, surgical, family and social history reviewed and updated as indicated. Interim medical history since our last visit reviewed. Allergies and medications reviewed and updated.  Review of Systems  Constitutional: Negative.   Respiratory: Negative.   Cardiovascular: Negative.   Gastrointestinal: Negative.   Musculoskeletal: Negative.   Neurological: Negative.   Psychiatric/Behavioral: Negative.     Per HPI unless specifically indicated above      Objective:    BP (!) 142/85   Pulse 60   Temp 98.2 F (36.8 C) (Oral)   Wt 206 lb (93.4 kg)   SpO2 96%   BMI 33.76 kg/m   Wt Readings from Last 3 Encounters:  11/12/19 206 lb (93.4 kg)  05/08/19 221 lb (100.2 kg)  05/03/18 220 lb (99.8 kg)    Physical Exam Vitals and nursing note reviewed.  Constitutional:      General: She is not in acute distress.    Appearance: Normal appearance. She is not ill-appearing, toxic-appearing or diaphoretic.  HENT:     Head: Normocephalic and atraumatic.     Right Ear: External ear normal.     Left Ear: External ear normal.     Nose: Nose normal.     Mouth/Throat:     Mouth: Mucous membranes are moist.     Pharynx: Oropharynx is clear.  Eyes:     General: No scleral icterus.       Right eye: No discharge.        Left eye: No discharge.     Extraocular Movements: Extraocular movements intact.     Conjunctiva/sclera: Conjunctivae normal.     Pupils: Pupils are equal, round, and reactive to light.  Cardiovascular:     Rate and Rhythm: Normal rate and regular rhythm.     Pulses: Normal pulses.     Heart sounds: Normal heart sounds. No murmur heard.  No friction rub. No gallop.   Pulmonary:     Effort: Pulmonary effort is normal. No respiratory distress.     Breath sounds: Normal breath sounds. No stridor. No wheezing, rhonchi or rales.  Chest:     Chest wall: No tenderness.  Musculoskeletal:        General: Normal range of motion.     Cervical back: Normal range of motion and neck supple.  Skin:    General: Skin is warm and dry.     Capillary Refill: Capillary refill takes less than 2 seconds.     Coloration: Skin is not jaundiced or pale.     Findings: No bruising, erythema, lesion or rash.  Neurological:     General: No focal deficit present.     Mental Status: She is alert and oriented to person, place, and time. Mental status is at baseline.  Psychiatric:        Mood and Affect: Mood normal.        Behavior: Behavior normal.         Thought Content: Thought content normal.        Judgment: Judgment normal.     Results for orders placed or performed in visit on 05/31/19  Thyroid Panel With TSH  Result Value Ref Range   TSH 0.512  0.450 - 4.500 uIU/mL   T4, Total 7.6 4.5 - 12.0 ug/dL   T3 Uptake Ratio 26 24 - 39 %   Free Thyroxine Index 2.0 1.2 - 4.9      Assessment & Plan:   Problem List Items Addressed This Visit      Endocrine   Adult hypothyroidism - Primary    Rechecking levels today. Await results. Treat as needed.       Relevant Orders   TSH   IFG (impaired fasting glucose)    Rechecking levels today. Await results. Treat as needed.       Relevant Orders   Bayer DCA Hb A1c Waived     Other   HLD (hyperlipidemia)    Rechecking levels today. Await results. Treat as needed.       Relevant Orders   Comprehensive metabolic panel   Lipid Panel w/o Chol/HDL Ratio   Anxiety and depression    Under good control on current regimen. Continue current regimen. Continue to monitor. Call with any concerns. Refills up to date.         Vitamin D deficiency    Rechecking levels today. Await results. Treat as needed.       Relevant Orders   VITAMIN D 25 Hydroxy (Vit-D Deficiency, Fractures)    Other Visit Diagnoses    Flu vaccine need       Flu shot given today.   Relevant Orders   Flu Vaccine QUAD 36+ mos IM (Completed)       Follow up plan: Return in about 6 months (around 05/11/2020) for physical with Jolene.

## 2019-11-13 ENCOUNTER — Other Ambulatory Visit: Payer: Self-pay | Admitting: Family Medicine

## 2019-11-13 DIAGNOSIS — E039 Hypothyroidism, unspecified: Secondary | ICD-10-CM

## 2019-11-13 LAB — COMPREHENSIVE METABOLIC PANEL
ALT: 13 IU/L (ref 0–32)
AST: 17 IU/L (ref 0–40)
Albumin/Globulin Ratio: 1.7 (ref 1.2–2.2)
Albumin: 4.1 g/dL (ref 3.8–4.9)
Alkaline Phosphatase: 69 IU/L (ref 44–121)
BUN/Creatinine Ratio: 28 — ABNORMAL HIGH (ref 9–23)
BUN: 16 mg/dL (ref 6–24)
Bilirubin Total: 0.3 mg/dL (ref 0.0–1.2)
CO2: 24 mmol/L (ref 20–29)
Calcium: 9 mg/dL (ref 8.7–10.2)
Chloride: 104 mmol/L (ref 96–106)
Creatinine, Ser: 0.57 mg/dL (ref 0.57–1.00)
GFR calc Af Amer: 119 mL/min/{1.73_m2} (ref >59)
GFR calc non Af Amer: 103 mL/min/{1.73_m2} (ref >59)
Globulin, Total: 2.4 g/dL (ref 1.5–4.5)
Glucose: 91 mg/dL (ref 65–99)
Potassium: 4.3 mmol/L (ref 3.5–5.2)
Sodium: 140 mmol/L (ref 134–144)
Total Protein: 6.5 g/dL (ref 6.0–8.5)

## 2019-11-13 LAB — LIPID PANEL W/O CHOL/HDL RATIO
Cholesterol, Total: 273 mg/dL — ABNORMAL HIGH (ref 100–199)
HDL: 68 mg/dL (ref >39)
LDL Chol Calc (NIH): 189 mg/dL — ABNORMAL HIGH (ref 0–99)
Triglycerides: 96 mg/dL (ref 0–149)
VLDL Cholesterol Cal: 16 mg/dL (ref 5–40)

## 2019-11-13 LAB — VITAMIN D 25 HYDROXY (VIT D DEFICIENCY, FRACTURES): Vit D, 25-Hydroxy: 35.1 ng/mL (ref 30.0–100.0)

## 2019-11-13 LAB — TSH: TSH: 0.251 u[IU]/mL — ABNORMAL LOW (ref 0.450–4.500)

## 2019-11-13 MED ORDER — LEVOTHYROXINE SODIUM 75 MCG PO TABS: 150.0000 ug | ORAL_TABLET | Freq: Every day | ORAL | 3 refills | Status: DC

## 2019-11-13 MED ORDER — LEVOTHYROXINE SODIUM 150 MCG PO TABS: 150.0000 ug | ORAL_TABLET | Freq: Every day | ORAL | 1 refills | Status: DC

## 2019-11-13 NOTE — Progress Notes (Signed)
Please let her know that her thyroid is over treated. I'd like her to cut down to 1104mcg and we'll recheck in 6 weeks. Everything else looks good. Her cholesterol is still high, but down 20points from last time. She has a lot of her pills at home, so she can just take 2 of her 75 to get to the correct dose.

## 2019-12-08 ENCOUNTER — Other Ambulatory Visit: Payer: Self-pay | Admitting: Family Medicine

## 2019-12-26 ENCOUNTER — Other Ambulatory Visit: Payer: BC Managed Care – PPO

## 2019-12-26 ENCOUNTER — Other Ambulatory Visit: Payer: Self-pay

## 2019-12-26 DIAGNOSIS — E039 Hypothyroidism, unspecified: Secondary | ICD-10-CM | POA: Diagnosis not present

## 2019-12-27 ENCOUNTER — Other Ambulatory Visit: Payer: Self-pay | Admitting: Family Medicine

## 2019-12-27 LAB — TSH: TSH: 0.632 u[IU]/mL (ref 0.450–4.500)

## 2019-12-27 MED ORDER — LEVOTHYROXINE SODIUM 150 MCG PO TABS
150.0000 ug | ORAL_TABLET | Freq: Every day | ORAL | 3 refills | Status: DC
Start: 2019-12-27 — End: 2020-05-22

## 2020-01-07 ENCOUNTER — Other Ambulatory Visit: Payer: Self-pay | Admitting: Family Medicine

## 2020-01-23 ENCOUNTER — Other Ambulatory Visit: Payer: Self-pay | Admitting: Obstetrics and Gynecology

## 2020-01-23 DIAGNOSIS — Z1231 Encounter for screening mammogram for malignant neoplasm of breast: Secondary | ICD-10-CM

## 2020-01-23 DIAGNOSIS — Z01419 Encounter for gynecological examination (general) (routine) without abnormal findings: Secondary | ICD-10-CM | POA: Diagnosis not present

## 2020-02-14 ENCOUNTER — Ambulatory Visit
Admission: RE | Admit: 2020-02-14 | Discharge: 2020-02-14 | Disposition: A | Discharge status: Home / Self Care | Payer: BC Managed Care – PPO | Source: Ambulatory Visit | Attending: Obstetrics and Gynecology | Admitting: Obstetrics and Gynecology

## 2020-02-14 ENCOUNTER — Other Ambulatory Visit: Payer: Self-pay

## 2020-02-14 DIAGNOSIS — Z1231 Encounter for screening mammogram for malignant neoplasm of breast: Secondary | ICD-10-CM | POA: Diagnosis not present

## 2020-03-07 DIAGNOSIS — M25562 Pain in left knee: Secondary | ICD-10-CM | POA: Diagnosis not present

## 2020-03-07 DIAGNOSIS — M1712 Unilateral primary osteoarthritis, left knee: Secondary | ICD-10-CM | POA: Diagnosis not present

## 2020-03-11 ENCOUNTER — Other Ambulatory Visit: Payer: Self-pay | Admitting: Orthopedic Surgery

## 2020-03-11 DIAGNOSIS — M2392 Unspecified internal derangement of left knee: Secondary | ICD-10-CM

## 2020-03-16 ENCOUNTER — Ambulatory Visit
Admission: RE | Admit: 2020-03-16 | Discharge: 2020-03-16 | Disposition: A | Discharge status: Home / Self Care | Payer: BC Managed Care – PPO | Source: Ambulatory Visit | Attending: Orthopedic Surgery | Admitting: Orthopedic Surgery

## 2020-03-16 ENCOUNTER — Other Ambulatory Visit: Payer: Self-pay

## 2020-03-16 DIAGNOSIS — M25562 Pain in left knee: Secondary | ICD-10-CM | POA: Diagnosis not present

## 2020-03-16 DIAGNOSIS — M2392 Unspecified internal derangement of left knee: Secondary | ICD-10-CM | POA: Diagnosis not present

## 2020-03-21 DIAGNOSIS — M2392 Unspecified internal derangement of left knee: Secondary | ICD-10-CM | POA: Diagnosis not present

## 2020-03-22 DIAGNOSIS — K317 Polyp of stomach and duodenum: Secondary | ICD-10-CM | POA: Insufficient documentation

## 2020-03-24 ENCOUNTER — Other Ambulatory Visit: Payer: Self-pay | Admitting: Nurse Practitioner

## 2020-03-25 NOTE — Telephone Encounter (Signed)
Requested Prescriptions  Pending Prescriptions Disp Refills  . omeprazole (PRILOSEC) 20 MG capsule [Pharmacy Med Name: OMEPRAZOLE DR 20 MG CAPSULE] 90 capsule 0    Sig: TAKE 1 CAPSULE BY MOUTH EVERY DAY     Gastroenterology: Proton Pump Inhibitors Passed - 03/24/2020  5:49 PM      Passed - Valid encounter within last 12 months    Recent Outpatient Visits          4 months ago Adult hypothyroidism   [facility] Burns, [place], DO   10 months ago Annual physical exam   [place], Barbaraann Faster, NP   1 year ago Anxiety and depression   [place], Barbaraann Faster, NP   1 year ago Adult hypothyroidism   [place], Barbaraann Faster, NP   2 years ago Adult hypothyroidism   [place], Barbaraann Faster, NP      Future Appointments            In 1 month Cannady, Barbaraann Faster, NP [employer]

## 2020-03-30 NOTE — H&P (Signed)
ORTHOPAEDIC HISTORY & PHYSICAL [place] Pierre, Florinda Marker., MD - 03/21/2020 10:45 AM EST Formatting of this note is different from the original. Images from the original note were not included. Chief Complaint: Chief Complaint  Patient presents with  . Knee Pain  Left knee pain, MRI 03/17/20   Reason for Visit: The patient is a 60 y.o. female who presents today for reevaluation of her left knee. She reports a 3 week(s) history of left knee pain without any specific trauma or aggravating event. 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 She localizes most of the pain along the medial aspect of the knee. She reports some swelling, no locking, and no giving way of the knee. The pain is aggravated by going up and down stairs, lateral movements, pivoting and rising after sitting. The patient has not appreciated any significant improvement despite activity modification both at work and home, intraarticular corticosteroid injection, and ambulatory aids. She has been using a crutch for ambulation.  Medications: Current Outpatient Medications  Medication Sig Dispense Refill  . aspirin 81 MG EC tablet Take 81 mg by mouth once daily.  . cholecalciferol (CHOLECALCIFEROL) 1,000 unit tablet Take 1,000 Units by mouth once daily  . clobetasoL (TEMOVATE) 0.05 % ointment Apply pea sized amount to affected area once nightly x 1 week, then every other night x 2 weeks, then maintenance 2x weekly. 30 g 0  . estradioL (CLIMARA) 0.05 mg/24 hr patch Place 1 patch onto the skin once a week 12 patch 4  . levothyroxine (SYNTHROID) 150 MCG tablet Take 1 tablet (150 mcg total) by mouth once daily  . loratadine (CLARITIN) 10 mg capsule Take 10 mg by mouth once daily.  . multivitamin tablet Take 1 tablet by mouth once daily  . omeprazole (PRILOSEC) 20 MG DR capsule Take 20 mg by mouth once daily 3  . progesterone (PROMETRIUM) 200 MG capsule Take 1 capsule (200 mg  total) by mouth once daily 90 capsule 4  . sertraline (ZOLOFT) 100 MG tablet Take 100 mg by mouth once daily 3   No current facility-administered medications for this visit.   Allergies: Allergies  Allergen Reactions  . Lisinopril Cough   Past Medical History: Past Medical History:  Diagnosis Date  . Chicken pox  . Depression  . HTN (hypertension) 07/23/2013  . Hyperlipemia 07/23/2013  . Hypothyroid, unspecified 07/23/2013  . Obesity 07/23/2013   Past Surgical History: Past Surgical History:  Procedure Laterality Date  . ARTHROSCOPY KNEE Left 05/2005  . bladder surgery  . CHOLECYSTECTOMY  . deviated septum repair  . ENDOMETRIAL ABLATION 2000  Dr Amalia Hailey  . gastric sleeve 09/03/13  . PAROTIDECTOMY Left 1996   Social History: Social History   Socioeconomic History  . Marital status: Single  Spouse name: Not on file  . Number of children: 0  . Years of education: 66  . Highest education level: High school graduate  Occupational History  . Occupation: Full-time- [employer]  Tobacco Use  . Smoking status: Former Smoker  Packs/day: 0.20  Years: 5.00  Pack years: 1.00  Types: Cigarettes  Quit date: 2000  Years since quitting: 22.0  . Smokeless tobacco: Never Used  Vaping Use  . Vaping Use: Never used  Substance and Sexual Activity  . Alcohol use: Yes  Alcohol/week: 0.0 standard drinks  Comment: Rarely  . Drug use: No  . Sexual activity: Not Currently  Partners: Male  Birth control/protection: Post-menopausal, None  Other Topics Concern  . Would you please tell us about the people who live in your home, your pets, or anything else important to your social life? Not Asked  Social History Narrative  . Not on file   Social Determinants of Health   Financial Resource Strain: Not on file  Food Insecurity: Not on file  Transportation Needs: Not on file  Physical Activity: Not on file  Stress: Not on file  Social Connections: Not on file  Housing Stability: Not  on file   Family History: Family History  Problem Relation Age of Onset  . Hepatitis C Mother  . Diabetes Mother  . High blood pressure (Hypertension) Sister  . No Known Problems Brother   Review of Systems: A comprehensive 14 point ROS was performed, reviewed, and the pertinent orthopaedic findings are documented in the HPI.  Exam BP (!) 140/88  Temp 36.5 C (97.7 F)  Ht 167.6 cm (5\' 6" )  Wt 92.3 kg (203 lb 6.4 oz)  LMP (LMP Unknown)  BMI 32.83 kg/m   General:  Well-developed, well-nourished female seen in no acute distress.  Antalgic gait.  No varus or valgus thrust to the left knee.  HEENT:  Atraumatic, normocephalic. Pupils are equal and reactive to light. Extraocular motion is intact. Sclera are clear. Oropharynx is clear with moist mucosa.  Lungs:  Clear to auscultation bilaterally.  Cardiovascular: Regular rate and rhythm. Normal S1, S2. No murmur . No appreciable gallops or rubs. Peripheral pulses are palpable. No lower extremity edema. Homan`s test is negative.   Extremities: Good strength, stability, and range of motion of the upper extremities. Good range of motion of the hips and ankles.  Left Knee:  Soft tissue swelling: mild Effusion: none Erythema: none Crepitance: minimal Tenderness: medial Alignment: normal Mediolateral laxity: stable Anterior drawer test:negative Lachman`s test: negative McMurray`s test: positive Atrophy: No significant atrophy.  Quadriceps tone was fair to good. Range of Motion: 0/2/115 degrees  Neurologic:  Awake, alert, and oriented.  Sensory function is intact to pinprick and light touch.  Motor strength is judged to be 5/5.  Motor coordination is within normal limits.  No apparent clonus. No tremor.   Radiographs: I reviewed the left knee radiographs that were performed at [facility] on 03/07/[facility]. There is mild narrowing of the medial cartilage space with associated varus alignment. Early osteophyte formation  is noted. No evidence of fracture or dislocation.   MRI: I reviewed the left knee MRI from [facility] dated 03/17/2020. I concur with the radiologist's interpretation as below:  MRI OF THE LEFT KNEE WITHOUT CONTRAST   TECHNIQUE:  Multiplanar, multisequence MR imaging of the knee was performed. No  intravenous contrast was administered.   COMPARISON: None.   FINDINGS:  Despite efforts by the technologist and patient, motion artifact is  present on today's exam and could not be eliminated. This reduces  exam sensitivity and specificity.   MENISCI  Medial meniscus: Prominent radial tear of the midbody. There is some  adjacent free edge irregularity and inferior surface irregularity in  the anterior horn next to this radial tear.   Lateral meniscus: No tear, discoid variant.   LIGAMENTS  Cruciates: Unremarkable   Collaterals: Mild edema tracks adjacent to the MCL. This can be  incidental but in the appropriate clinical circumstance could  represent grade 1 sprain.   CARTILAGE  Patellofemoral: Prominent chondral thinning along substantial  portions of the patella, especially the posterior patellar ridge  and  portions of the medial facet. Moderate chondral thinning elsewhere.  Marginal spurring noted.   Medial: Prominent degenerative chondral thinning. Marginal spurring.   Lateral: Chondral heterogeneity compatible with mild chondromalacia.  Marginal spurring.   Joint: Small knee effusion. Mild synovitis along the posterior  margin of Hoffa's fat pad on image 22 of series 13.   Popliteal Fossa: Small Baker's cyst.   Extensor Mechanism: Mild proximal patellar tendinopathy.   Bones: No significant extra-articular osseous abnormalities  identified.   Other: No supplemental non-categorized findings.   IMPRESSION:  1. Prominent radial tear of the midbody medial meniscus.  2. Mild edema tracks adjacent to the MCL. This can be incidental but  in  the appropriate clinical circumstance could represent grade 1  sprain.  3. Prominent chondral thinning in the patellofemoral joint and  medial compartment with mild chondromalacia in the lateral  compartment.  4. Small knee effusion with mild synovitis along the posterior  margin of Hoffa's fat pad.  5. Small Baker's cyst.  6. Mild proximal patellar tendinopathy.  7. Despite efforts by the technologist and patient, motion artifact  is present on today's exam and could not be eliminated. This reduces  exam sensitivity and specificity.   Electronically Signed  By: Van Clines M.D.  On: 03/17/2020 10:09   Impression: Internal derangement of the left knee  Plan:  The findings were discussed in detail with the patient. Although her radiographs and MRI demonstrate some degenerative changes, her symptoms are more suggestive of meniscal pathology as the primary source of her symptoms. The patient was given informational material on knee arthroscopy. Conservative treatment options were reviewed with the patient. We discussed the risks and benefits of surgical intervention. Arthroscopy is an appropriate treatment for the meniscal pathology, but would have limited or no effect on degenerative changes of the articular cartilage. The usual perioperative course was also discussed in detail. The patient expressed understanding of the risks and benefits of surgical intervention and would like to proceed with plans for left knee arthroscopy.  MEDICAL CLEARANCE: Per anesthesiology. ACTIVITIES:  Avoid pivoting, squatting, or twisting. WORK STATUS: Anticipate out of work for 2-3 weeks. THERAPY: Quadriceps strengthening exercises. MEDICATIONS: Requested Prescriptions   No prescriptions requested or ordered in this encounter   FOLLOW-UP: Return for preop History & Physical pending surgery date.   Rusell Meneely P. Holley Bouche., M.D.  This note was generated in part with voice recognition  software and I apologize for any typographical errors that were not detected and corrected.   Electronically signed by Lamar Benes., MD at 03/22/2020 4:51 PM EST

## 2020-03-31 ENCOUNTER — Encounter
Admission: RE | Admit: 2020-03-31 | Discharge: 2020-03-31 | Disposition: A | Discharge status: Home / Self Care | Payer: BC Managed Care – PPO | Source: Ambulatory Visit | Attending: Orthopedic Surgery | Admitting: Orthopedic Surgery

## 2020-03-31 ENCOUNTER — Other Ambulatory Visit: Payer: Self-pay

## 2020-03-31 HISTORY — DX: Unspecified osteoarthritis, unspecified site: M19.90

## 2020-03-31 HISTORY — DX: Prediabetes: R73.03

## 2020-03-31 HISTORY — DX: Anemia, unspecified: D64.9

## 2020-03-31 HISTORY — DX: Sleep apnea, unspecified: G47.30

## 2020-03-31 HISTORY — DX: Gastro-esophageal reflux disease without esophagitis: K21.9

## 2020-03-31 NOTE — Patient Instructions (Signed)
Your procedure is scheduled on:04-09-20 [facility] Report to the Registration Desk on the 1st floor of the Medical Mall-Then proceed to the 2nd floor Surgery Desk in the [place] [facility] To find out your arrival time, please call [phone] between 1PM - 3PM on:04-08-20 TUESDAY  REMEMBER: Instructions that are not followed completely may result in serious medical risk, up to and including death; or upon the discretion of your surgeon and anesthesiologist your surgery may need to be rescheduled.  Do not eat food after midnight the night before surgery.  No gum chewing, lozengers or hard candies.  You may however, drink CLEAR liquids up to 2 hours before you are scheduled to arrive for your surgery. Do not drink anything within 2 hours of your scheduled arrival time.  Clear liquids include: - water  - apple juice without pulp - gatorade - black coffee or tea (Do NOT add milk or creamers to the coffee or tea) Do NOT drink anything that is not on this list.  In addition, your doctor has ordered for you to drink the provided  Ensure Pre-Surgery Clear Carbohydrate Drink  Drinking this carbohydrate drink up to two hours before surgery helps to reduce insulin resistance and improve patient outcomes. Please complete drinking 2 hours prior to scheduled arrival time.  TAKE THESE MEDICATIONS THE MORNING OF SURGERY WITH A SIP OF WATER: -SYNTHROID (LEVOTHYROXINE) -CLARITIN (LORATADINE) -ZOLOFT (SERTRALINE) -PRILOSEC (OMEPRAZOLE)-take one the night before and one on the morning of surgery - helps to prevent nausea after surgery.)  Follow recommendations from Cardiologist, Pulmonologist or PCP regarding stopping Aspirin, Coumadin, Plavix, Eliquis, Pradaxa, or Pletal-STOP YOUR ASPIRIN 7 DAYS PRIOR TO SURGERY-LAST DOSE ON 04-01-20  One week prior to surgery: Stop Anti-inflammatories (NSAIDS) such as Advil, Aleve, Ibuprofen, Motrin, Naproxen, Naprosyn and Aspirin based products such as Excedrin, Goodys  Powder, BC Powder-OK TO TAKE TYLENOL IF NEEDED  Stop ANY OVER THE COUNTER supplements until after surgery. (However, you may continue taking Vitamin D and multivitamin up until the day before surgery.)  No Alcohol for 24 hours before or after surgery.  No Smoking including e-cigarettes for 24 hours prior to surgery.  No chewable tobacco products for at least 6 hours prior to surgery.  No nicotine patches on the day of surgery.  Do not use any "recreational" drugs for at least a week prior to your surgery.  Please be advised that the combination of cocaine and anesthesia may have negative outcomes, up to and including death. If you test positive for cocaine, your surgery will be cancelled.  On the morning of surgery brush your teeth with toothpaste and water, you may rinse your mouth with mouthwash if you wish. Do not swallow any toothpaste or mouthwash.  Do not wear jewelry, make-up, hairpins, clips or nail polish.  Do not wear lotions, powders, or perfumes.   Do not shave body from the neck down 48 hours prior to surgery just in case you cut yourself which could leave a site for infection.  Also, freshly shaved skin may become irritated if using the CHG soap.  Contact lenses, hearing aids and dentures may not be worn into surgery.  Do not bring valuables to the hospital. [facility] is not responsible for any missing/lost belongings or valuables.   Use CHG Soap as directed on instruction sheet.  Notify your doctor if there is any change in your medical condition (cold, fever, infection).  Wear comfortable clothing (specific to your surgery type) to the hospital.  Plan for stool  softeners for home use; pain medications have a tendency to cause constipation. You can also help prevent constipation by eating foods high in fiber such as fruits and vegetables and drinking plenty of fluids as your diet allows.  After surgery, you can help prevent lung complications by doing breathing  exercises.  Take deep breaths and cough every 1-2 hours. Your doctor may order a device called an Incentive Spirometer to help you take deep breaths. When coughing or sneezing, hold a pillow firmly against your incision with both hands. This is called "splinting." Doing this helps protect your incision. It also decreases belly discomfort.  If you are being admitted to the hospital overnight, leave your suitcase in the car. After surgery it may be brought to your room.  If you are being discharged the day of surgery, you will not be allowed to drive home. You will need a responsible adult (18 years or older) to drive you home and stay with you that night.   If you are taking public transportation, you will need to have a responsible adult (18 years or older) with you. Please confirm with your physician that it is acceptable to use public transportation.   Please call the [place] Dept. at [phone] if you have any questions about these instructions.  Visitation Policy:  Patients undergoing a surgery or procedure may have one family member or support person with them as long as that person is not COVID-19 positive or experiencing its symptoms.  That person may remain in the waiting area during the procedure.  Inpatient Visitation:    Visiting hours are 7 a.m. to 8 p.m. Patients will be allowed one visitor. The visitor may change daily. The visitor must pass COVID-19 screenings, use hand sanitizer when entering and exiting the patient's room and wear a mask at all times, including in the patient's room. Patients must also wear a mask when staff or their visitor are in the room. Masking is required regardless of vaccination status. Systemwide, no visitors 17 or younger.

## 2020-04-02 ENCOUNTER — Other Ambulatory Visit: Payer: Self-pay

## 2020-04-02 ENCOUNTER — Encounter
Admission: RE | Admit: 2020-04-02 | Discharge: 2020-04-02 | Disposition: A | Discharge status: Home / Self Care | Payer: BC Managed Care – PPO | Source: Ambulatory Visit | Attending: Orthopedic Surgery | Admitting: Orthopedic Surgery

## 2020-04-02 DIAGNOSIS — I10 Essential (primary) hypertension: Secondary | ICD-10-CM | POA: Insufficient documentation

## 2020-04-02 DIAGNOSIS — Z01818 Encounter for other preprocedural examination: Secondary | ICD-10-CM | POA: Insufficient documentation

## 2020-04-02 DIAGNOSIS — G4730 Sleep apnea, unspecified: Secondary | ICD-10-CM | POA: Insufficient documentation

## 2020-04-02 DIAGNOSIS — G4733 Obstructive sleep apnea (adult) (pediatric): Secondary | ICD-10-CM | POA: Diagnosis not present

## 2020-04-02 LAB — BASIC METABOLIC PANEL
Anion gap: 9 (ref 5–15)
BUN: 18 mg/dL (ref 6–20)
CO2: 28 mmol/L (ref 22–32)
Calcium: 9.1 mg/dL (ref 8.9–10.3)
Chloride: 102 mmol/L (ref 98–111)
Creatinine, Ser: 0.62 mg/dL (ref 0.44–1.00)
GFR, Estimated: >60 mL/min (ref >60)
Glucose, Bld: 100 mg/dL — ABNORMAL HIGH (ref 70–99)
Potassium: 4.2 mmol/L (ref 3.5–5.1)
Sodium: 139 mmol/L (ref 135–145)

## 2020-04-07 ENCOUNTER — Other Ambulatory Visit
Admission: RE | Admit: 2020-04-07 | Discharge: 2020-04-07 | Disposition: A | Discharge status: Home / Self Care | Payer: BC Managed Care – PPO | Source: Ambulatory Visit | Attending: Orthopedic Surgery | Admitting: Orthopedic Surgery

## 2020-04-07 ENCOUNTER — Other Ambulatory Visit: Payer: Self-pay

## 2020-04-07 DIAGNOSIS — Z01812 Encounter for preprocedural laboratory examination: Secondary | ICD-10-CM | POA: Insufficient documentation

## 2020-04-07 DIAGNOSIS — Z888 Allergy status to other drugs, medicaments and biological substances status: Secondary | ICD-10-CM | POA: Diagnosis not present

## 2020-04-07 DIAGNOSIS — Z87891 Personal history of nicotine dependence: Secondary | ICD-10-CM | POA: Diagnosis not present

## 2020-04-07 DIAGNOSIS — Z7982 Long term (current) use of aspirin: Secondary | ICD-10-CM | POA: Diagnosis not present

## 2020-04-07 DIAGNOSIS — M2392 Unspecified internal derangement of left knee: Secondary | ICD-10-CM | POA: Diagnosis not present

## 2020-04-07 DIAGNOSIS — S83242A Other tear of medial meniscus, current injury, left knee, initial encounter: Secondary | ICD-10-CM | POA: Diagnosis not present

## 2020-04-07 DIAGNOSIS — M94262 Chondromalacia, left knee: Secondary | ICD-10-CM | POA: Diagnosis not present

## 2020-04-07 DIAGNOSIS — Z79899 Other long term (current) drug therapy: Secondary | ICD-10-CM | POA: Diagnosis not present

## 2020-04-07 DIAGNOSIS — Z20822 Contact with and (suspected) exposure to covid-19: Secondary | ICD-10-CM | POA: Insufficient documentation

## 2020-04-07 DIAGNOSIS — X58XXXA Exposure to other specified factors, initial encounter: Secondary | ICD-10-CM | POA: Diagnosis not present

## 2020-04-07 DIAGNOSIS — Z79890 Hormone replacement therapy (postmenopausal): Secondary | ICD-10-CM | POA: Diagnosis not present

## 2020-04-08 ENCOUNTER — Encounter: Payer: Self-pay | Admitting: Orthopedic Surgery

## 2020-04-08 LAB — SARS CORONAVIRUS 2 (TAT 6-24 HRS): SARS Coronavirus 2: NEGATIVE

## 2020-04-08 MED ORDER — LACTATED RINGERS IV SOLN: INTRAVENOUS | Status: DC

## 2020-04-08 MED ORDER — ORAL CARE MOUTH RINSE: 15.0000 mL | Freq: Once | OROMUCOSAL | Status: AC

## 2020-04-08 MED ORDER — CHLORHEXIDINE GLUCONATE 0.12 % MT SOLN: 15.0000 mL | Freq: Once | OROMUCOSAL | Status: AC

## 2020-04-08 MED ORDER — CELECOXIB 200 MG PO CAPS: 400.0000 mg | ORAL_CAPSULE | Freq: Once | ORAL | Status: AC

## 2020-04-09 ENCOUNTER — Other Ambulatory Visit: Payer: Self-pay

## 2020-04-09 ENCOUNTER — Encounter
Admission: RE | Disposition: A | Discharge status: Home / Self Care | Payer: Self-pay | Source: Home / Self Care | Attending: Orthopedic Surgery

## 2020-04-09 ENCOUNTER — Ambulatory Visit
Admission: RE | Admit: 2020-04-09 | Discharge: 2020-04-09 | Disposition: A | Discharge status: Home / Self Care | Payer: BC Managed Care – PPO | Attending: Orthopedic Surgery | Admitting: Orthopedic Surgery

## 2020-04-09 ENCOUNTER — Ambulatory Visit: Payer: BC Managed Care – PPO | Admitting: Anesthesiology

## 2020-04-09 ENCOUNTER — Encounter: Payer: Self-pay | Admitting: Orthopedic Surgery

## 2020-04-09 DIAGNOSIS — X58XXXA Exposure to other specified factors, initial encounter: Secondary | ICD-10-CM | POA: Insufficient documentation

## 2020-04-09 DIAGNOSIS — Z888 Allergy status to other drugs, medicaments and biological substances status: Secondary | ICD-10-CM | POA: Diagnosis not present

## 2020-04-09 DIAGNOSIS — Z20822 Contact with and (suspected) exposure to covid-19: Secondary | ICD-10-CM | POA: Insufficient documentation

## 2020-04-09 DIAGNOSIS — Z79890 Hormone replacement therapy (postmenopausal): Secondary | ICD-10-CM | POA: Diagnosis not present

## 2020-04-09 DIAGNOSIS — I10 Essential (primary) hypertension: Secondary | ICD-10-CM | POA: Diagnosis not present

## 2020-04-09 DIAGNOSIS — Z7982 Long term (current) use of aspirin: Secondary | ICD-10-CM | POA: Insufficient documentation

## 2020-04-09 DIAGNOSIS — Z9889 Other specified postprocedural states: Secondary | ICD-10-CM

## 2020-04-09 DIAGNOSIS — Z87891 Personal history of nicotine dependence: Secondary | ICD-10-CM | POA: Insufficient documentation

## 2020-04-09 DIAGNOSIS — D649 Anemia, unspecified: Secondary | ICD-10-CM | POA: Diagnosis not present

## 2020-04-09 DIAGNOSIS — M23222 Derangement of posterior horn of medial meniscus due to old tear or injury, left knee: Secondary | ICD-10-CM | POA: Diagnosis not present

## 2020-04-09 DIAGNOSIS — Z79899 Other long term (current) drug therapy: Secondary | ICD-10-CM | POA: Diagnosis not present

## 2020-04-09 DIAGNOSIS — S83242A Other tear of medial meniscus, current injury, left knee, initial encounter: Secondary | ICD-10-CM | POA: Insufficient documentation

## 2020-04-09 DIAGNOSIS — M94262 Chondromalacia, left knee: Secondary | ICD-10-CM | POA: Diagnosis not present

## 2020-04-09 DIAGNOSIS — M2242 Chondromalacia patellae, left knee: Secondary | ICD-10-CM | POA: Diagnosis not present

## 2020-04-09 DIAGNOSIS — G4733 Obstructive sleep apnea (adult) (pediatric): Secondary | ICD-10-CM | POA: Diagnosis not present

## 2020-04-09 DIAGNOSIS — M2392 Unspecified internal derangement of left knee: Secondary | ICD-10-CM | POA: Diagnosis not present

## 2020-04-09 HISTORY — PX: KNEE ARTHROSCOPY: SHX127

## 2020-04-09 SURGERY — ARTHROSCOPY, KNEE
Anesthesia: General | Site: Knee | Laterality: Left

## 2020-04-09 MED ORDER — CHLORHEXIDINE GLUCONATE 0.12 % MT SOLN
OROMUCOSAL | Status: AC
  Administered 2020-04-09: 15 mL via OROMUCOSAL
  Filled 2020-04-09: qty 15

## 2020-04-09 MED ORDER — FENTANYL CITRATE (PF) 100 MCG/2ML IJ SOLN
INTRAMUSCULAR | Status: DC | PRN
  Administered 2020-04-09: 25 ug via INTRAVENOUS
  Administered 2020-04-09: 50 ug via INTRAVENOUS
  Administered 2020-04-09: 25 ug via INTRAVENOUS

## 2020-04-09 MED ORDER — METOCLOPRAMIDE HCL 10 MG PO TABS: 5.0000 mg | ORAL_TABLET | Freq: Three times a day (TID) | ORAL | Status: DC | PRN

## 2020-04-09 MED ORDER — MIDAZOLAM HCL 2 MG/2ML IJ SOLN
INTRAMUSCULAR | Status: DC | PRN
  Administered 2020-04-09: 2 mg via INTRAVENOUS

## 2020-04-09 MED ORDER — BUPIVACAINE-EPINEPHRINE 0.25% -1:200000 IJ SOLN
INTRAMUSCULAR | Status: DC | PRN
  Administered 2020-04-09: 30 mL

## 2020-04-09 MED ORDER — PROPOFOL 10 MG/ML IV BOLUS
INTRAVENOUS | Status: AC
  Filled 2020-04-09: qty 20

## 2020-04-09 MED ORDER — ACETAMINOPHEN 10 MG/ML IV SOLN
INTRAVENOUS | Status: DC | PRN
  Administered 2020-04-09: 1000 mg via INTRAVENOUS

## 2020-04-09 MED ORDER — PROPOFOL 10 MG/ML IV BOLUS
INTRAVENOUS | Status: DC | PRN
  Administered 2020-04-09: 150 mg via INTRAVENOUS

## 2020-04-09 MED ORDER — CELECOXIB 200 MG PO CAPS
ORAL_CAPSULE | ORAL | Status: AC
  Administered 2020-04-09: 400 mg via ORAL
  Filled 2020-04-09: qty 2

## 2020-04-09 MED ORDER — FENTANYL CITRATE (PF) 100 MCG/2ML IJ SOLN
INTRAMUSCULAR | Status: AC
  Filled 2020-04-09: qty 2

## 2020-04-09 MED ORDER — DEXAMETHASONE SODIUM PHOSPHATE 10 MG/ML IJ SOLN
INTRAMUSCULAR | Status: DC | PRN
  Administered 2020-04-09: 5 mg via INTRAVENOUS

## 2020-04-09 MED ORDER — ONDANSETRON HCL 4 MG/2ML IJ SOLN: 4.0000 mg | Freq: Four times a day (QID) | INTRAMUSCULAR | Status: DC | PRN

## 2020-04-09 MED ORDER — HYDROCODONE-ACETAMINOPHEN 5-325 MG PO TABS: 1.0000 | ORAL_TABLET | ORAL | 0 refills | Status: DC | PRN

## 2020-04-09 MED ORDER — METOCLOPRAMIDE HCL 5 MG/ML IJ SOLN: 5.0000 mg | Freq: Three times a day (TID) | INTRAMUSCULAR | Status: DC | PRN

## 2020-04-09 MED ORDER — ONDANSETRON HCL 4 MG PO TABS: 4.0000 mg | ORAL_TABLET | Freq: Four times a day (QID) | ORAL | Status: DC | PRN

## 2020-04-09 MED ORDER — MIDAZOLAM HCL 2 MG/2ML IJ SOLN
INTRAMUSCULAR | Status: AC
  Filled 2020-04-09: qty 2

## 2020-04-09 MED ORDER — ONDANSETRON HCL 4 MG/2ML IJ SOLN
INTRAMUSCULAR | Status: DC | PRN
  Administered 2020-04-09: 4 mg via INTRAVENOUS

## 2020-04-09 MED ORDER — PROPOFOL 500 MG/50ML IV EMUL
INTRAVENOUS | Status: AC
  Filled 2020-04-09: qty 50

## 2020-04-09 MED ORDER — SODIUM CHLORIDE 0.9 % IV SOLN: INTRAVENOUS | Status: DC

## 2020-04-09 MED ORDER — LIDOCAINE HCL (PF) 2 % IJ SOLN
INTRAMUSCULAR | Status: AC
  Filled 2020-04-09: qty 5

## 2020-04-09 MED ORDER — MORPHINE SULFATE 4 MG/ML IJ SOLN
INTRAMUSCULAR | Status: DC | PRN
  Administered 2020-04-09: 4 mg via INTRAMUSCULAR

## 2020-04-09 MED ORDER — ACETAMINOPHEN 10 MG/ML IV SOLN
INTRAVENOUS | Status: AC
  Filled 2020-04-09: qty 100

## 2020-04-09 SURGICAL SUPPLY — 29 items
ADAPTER IRRIG TUBE 2 SPIKE SOL (ADAPTER) ×4 IMPLANT
BLADE SHAVER 4.5 DBL SERAT CV (CUTTER) IMPLANT
COVER WAND RF STERILE (DRAPES) ×2 IMPLANT
CUFF TOURN SGL QUICK 24 (TOURNIQUET CUFF)
CUFF TOURN SGL QUICK 30 (TOURNIQUET CUFF)
CUFF TRNQT CYL 24X4X16.5-23 (TOURNIQUET CUFF) IMPLANT
CUFF TRNQT CYL 30X4X21-28X (TOURNIQUET CUFF) IMPLANT
DRAPE ARTHRO LIMB 89X125 STRL (DRAPES) ×2 IMPLANT
DRSG DERMACEA 8X12 NADH (GAUZE/BANDAGES/DRESSINGS) ×2 IMPLANT
DURAPREP 26ML APPLICATOR (WOUND CARE) ×4 IMPLANT
GAUZE SPONGE 4X4 12PLY STRL (GAUZE/BANDAGES/DRESSINGS) ×2 IMPLANT
GLOVE INDICATOR 8.0 STRL GRN (GLOVE) ×2 IMPLANT
GLOVE SURG ENC TEXT LTX SZ7.5 (GLOVE) ×2 IMPLANT
GOWN STRL REUS W/ TWL LRG LVL3 (GOWN DISPOSABLE) ×2 IMPLANT
GOWN STRL REUS W/TWL LRG LVL3 (GOWN DISPOSABLE) ×2
IV LACTATED RINGER IRRG 3000ML (IV SOLUTION) ×6
IV LR IRRIG 3000ML ARTHROMATIC (IV SOLUTION) ×6 IMPLANT
KIT TURNOVER KIT A (KITS) ×2 IMPLANT
MANIFOLD NEPTUNE II (INSTRUMENTS) ×4 IMPLANT
PACK ARTHROSCOPY KNEE (MISCELLANEOUS) ×2 IMPLANT
SET TUBE SUCT SHAVER OUTFL 24K (TUBING) ×2 IMPLANT
SET TUBE TIP INTRA-ARTICULAR (MISCELLANEOUS) ×2 IMPLANT
SOL PREP PVP 2OZ (MISCELLANEOUS) ×2
SOLUTION PREP PVP 2OZ (MISCELLANEOUS) ×1 IMPLANT
SUT ETHILON 3-0 FS-10 30 BLK (SUTURE) ×2
SUTURE EHLN 3-0 FS-10 30 BLK (SUTURE) ×1 IMPLANT
TUBING ARTHRO INFLOW-ONLY STRL (TUBING) ×2 IMPLANT
WAND HAND CNTRL MULTIVAC 50 (MISCELLANEOUS) ×2 IMPLANT
WRAP KNEE W/COLD PACKS 25.5X14 (SOFTGOODS) ×2 IMPLANT

## 2020-04-09 NOTE — Discharge Instructions (Signed)
AMBULATORY SURGERY  DISCHARGE INSTRUCTIONS   1) The drugs that you were given will stay in your system until tomorrow so for the next 24 hours you should not:  A) Drive an automobile B) Make any legal decisions C) Drink any alcoholic beverage   2) You may resume regular meals tomorrow.  Today it is better to start with liquids and gradually work up to solid foods.  You may eat anything you prefer, but it is better to start with liquids, then soup and crackers, and gradually work up to solid foods.   3) Please notify your doctor immediately if you have any unusual bleeding, trouble breathing, redness and pain at the surgery site, drainage, fever, or pain not relieved by medication.    4) Additional Instructions:        Please contact your physician with any problems or Same Day Surgery at [phone], Monday through Friday 6 am to 4 pm, or [place] at [facility] number at [phone]. Instructions after Knee Arthroscopy    James P. Holley Bouche., M.D.     Dept. of [facility]  [place],   [address]   Phone: [phone]   Fax: [phone]   DIET:  Drink plenty of non-alcoholic fluids & begin a light diet.  Resume your normal diet the day after surgery.  ACTIVITY:   You may use crutches or a walker with weight-bearing as tolerated, unless instructed otherwise.  You may wean yourself off of the walker or crutches as tolerated.   Begin doing gentle exercises. Exercising will reduce the pain and swelling, increase motion, and prevent muscle weakness.    Avoid strenuous activities or athletics for a minimum of 4-6 weeks after arthroscopic surgery.  Do not drive or operate any equipment until instructed.  WOUND CARE:   Place one to two pillows under the knee the first day or two when sitting or lying.   Continue to use the ice packs periodically to reduce pain and swelling.  The small incisions  in your knee are closed with nylon stitches. The stitches will be removed in the office.  The bulky dressing may be removed on the second day after surgery. DO NOT TOUCH THE STITCHES. Put a Band-Aid over each stitch. Do NOT use any ointments or creams on the incisions.   You may bathe or shower after the stitches are removed at the first office visit following surgery.  MEDICATIONS:  You may resume your regular medications.  Please take the pain medication as prescribed.  Do not take pain medication on an empty stomach.  Do not drive or drink alcoholic beverages when taking pain medications.  CALL THE OFFICE FOR:  Temperature above 101 degrees  Excessive bleeding or drainage on the dressing.  Excessive swelling, coldness, or paleness of the toes.  Persistent nausea and vomiting.  FOLLOW-UP:   You should have an appointment to return to the office in 7-10 days after surgery.       [facility] Department Directory         [email or web address]       [email or web address]          Cardiology  Appointments: [place] [phone]  Endocrinology  Appointments: [place] [phone] [place] [phone]  Gastroenterology  Appointments: [place] [phone] [place] [phone]        General Surgery   Appointments: [facility] [phone]  Internal Medicine/Family Medicine  Appointments: [facility]  [place] - [phone]  Metabolic and [place] Loss Surgery  Appointments: [facility]        Neurology  Appointments: [place] - [phone]  Neurosurgery  Appointments: [place]  Obstetrics & Gynecology  Appointments: [place] [phone] [place] - [phone]        Pediatrics  Appointments: [place] [phone] [place] - [phone]  Physiatry  Appointments: [place]  [phone]  Physical Therapy  Appointments: [place] [phone]        Podiatry  Appointments: [place] [phone] [place] - [phone]  Pulmonology  Appointments: [place]  Rheumatology  Appointments: [place] [phone]        [place] Location: [facility]  [address] [place], [place]  [address]  [place] Location: [facility] [address], [place]  [address]  [place] Location: [facility] [address] [place], [place]  [address]    AMBULATORY SURGERY  DISCHARGE INSTRUCTIONS   5) The drugs that you were given will stay in your system until tomorrow so for the next 24 hours you should not:  D) Drive an automobile E) Make any legal decisions F) Drink any alcoholic beverage   6) You may resume regular meals tomorrow.  Today it is better to start with liquids and gradually work up to solid foods.  You may eat anything you prefer, but it is better to start with liquids, then soup and crackers, and gradually work up to solid foods.   7) Please notify your doctor immediately if you have any unusual bleeding, trouble breathing, redness and pain at the surgery site, drainage, fever, or pain not relieved by medication.    8) Additional Instructions:        Please contact your physician with any problems or Same Day Surgery at [phone], Monday through Friday 6 am to 4 pm, or  at [facility] number at [phone].

## 2020-04-09 NOTE — Transfer of Care (Signed)
Immediate Anesthesia Transfer of Care Note  Patient: [facility]  Procedure(s) Performed: ARTHROSCOPY KNEE (Left Knee)  Patient Location: PACU  Anesthesia Type:General  Level of Consciousness: awake and alert   Airway & Oxygen Therapy: Patient Spontanous Breathing and Patient connected to nasal cannula oxygen  Post-op Assessment: Report given to RN and Post -op Vital signs reviewed and stable  Post vital signs: Reviewed and stable  Last Vitals:  Vitals Value Taken Time  BP    Temp    Pulse    Resp    SpO2      Last Pain:  Vitals:   04/09/20 1254  TempSrc: Oral  PainSc: 3          Complications: No complications documented.

## 2020-04-09 NOTE — Op Note (Signed)
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   ASSISTANT: none  ANESTHESIA: general  ESTIMATED BLOOD LOSS: Minimal  FLUIDS REPLACED: 800 mL of crystalloid  TOURNIQUET TIME: Not used  INDICATIONS FOR SURGERY: Itza Maniaci is a 60 y.o. year old female who has been seen for complaints of left knee pain. MRI demonstrated findings consistent with meniscal pathology. After discussion of the risks and benefits of surgical intervention, the patient expressed understanding of the risks benefits and agree with plans for left knee arthroscopy.   PROCEDURE IN DETAIL: The patient was brought into the operating room and, after adequate general anesthesia was achieved, a tourniquet was applied to the left thigh and the leg was placed in the leg holder. All bony prominences were well padded. The patient's left knee was cleaned and prepped with alcohol and Duraprep and draped in the usual sterile fashion. A "timeout" was performed as per usual protocol. The anticipated portal sites were injected with 0.25% Marcaine with epinephrine. An anterolateral incision was made and a cannula was inserted. A moderate effusion was evacuated and the knee was distended with fluid using the pump. The scope was advanced down the medial gutter into the medial compartment. Under visualization with the scope, an anteromedial portal was created and a hooked probe was inserted. The medial meniscus was visualized and probed.  There was a radial tear of the midportion of the medial meniscus.  The tear was debrided  using meniscal punches and a 4.5 mm incisor shaver.  Final contouring was performed using the 50 degree ArthroCare wand.  The remaining rim of meniscus was visualized and probed and felt to be stable.  The articular cartilage was visualized.  Grade 3 changes of chondromalacia were noted to the medial compartment with a relatively large chondral flap noted.  These areas were debrided and contoured using the ArthroCare wand.  The scope was then advanced into the intercondylar notch. The anterior cruciate ligament was visualized and probed and felt to be intact. The scope was removed from the lateral portal and reinserted via the anteromedial portal to better visualize the lateral compartment. The lateral meniscus was visualized and probed.  The lateral meniscus had a discoid type anatomy but no evidence of tear or instability.  The articular cartilage of the lateral compartment was visualized and noted to be in good condition.  Finally, the scope was advanced so as to visualize the patellofemoral articulation. Good patellar tracking was appreciated.  Grade III chondromalacia was noted especially to the intercondylar sulcus.  This area was debrided and contoured using the ArthroCare wand.  The knee was irrigated with copius amounts of fluid and suctioned dry. The anterolateral portal was re-approximated with #3-0 nylon. A combination of 0.25% Marcaine with epinephrine and 4 mg of Morphine were injected via the scope. The scope was removed and the anteromedial portal was re-approximated with #3-0 nylon. A sterile dressing was applied followed by application of an ice wrap.  The patient tolerated the procedure well and was transported to the PACU in stable condition.  Carlyann Placide P. Holley Bouche., M.D.

## 2020-04-09 NOTE — Anesthesia Preprocedure Evaluation (Signed)
Anesthesia Evaluation  Patient identified by MRN, date of birth, ID band Patient awake    Reviewed: Allergy & Precautions, H&P , NPO status , Patient's Chart, lab work & pertinent test results, reviewed documented beta blocker date and time   Airway Mallampati: III  TM Distance: >3 FB Neck ROM: full    Dental  (+) Teeth Intact   Pulmonary sleep apnea , former smoker,    Pulmonary exam normal        Cardiovascular Exercise Tolerance: Good hypertension, On Medications negative cardio ROS Normal cardiovascular exam Rate:Normal     Neuro/Psych PSYCHIATRIC DISORDERS Anxiety Depression negative neurological ROS     GI/Hepatic Neg liver ROS, GERD  Medicated,  Endo/Other  Hypothyroidism   Renal/GU negative Renal ROS  negative genitourinary   Musculoskeletal   Abdominal   Peds  Hematology  (+) Blood dyscrasia, anemia ,   Anesthesia Other Findings   Reproductive/Obstetrics negative OB ROS                             Anesthesia Physical Anesthesia Plan  ASA: III  Anesthesia Plan: General LMA   Post-op Pain Management:    Induction:   PONV Risk Score and Plan: 4 or greater  Airway Management Planned:   Additional Equipment:   Intra-op Plan:   Post-operative Plan:   Informed Consent: I have reviewed the patients History and Physical, chart, labs and discussed the procedure including the risks, benefits and alternatives for the proposed anesthesia with the patient or authorized representative who has indicated his/her understanding and acceptance.       Plan Discussed with: CRNA  Anesthesia Plan Comments:         Anesthesia Quick Evaluation

## 2020-04-09 NOTE — Anesthesia Procedure Notes (Signed)
Procedure Name: LMA Insertion Performed by: Langley Flatley Ben, CRNA Pre-anesthesia Checklist: Patient identified, Emergency Drugs available, Suction available and Patient being monitored Patient Re-evaluated:Patient Re-evaluated prior to induction Oxygen Delivery Method: Circle system utilized Preoxygenation: Pre-oxygenation with 100% oxygen Induction Type: IV induction Ventilation: Mask ventilation without difficulty LMA: LMA inserted LMA Size: 4.0 Tube type: Oral Number of attempts: 1 Airway Equipment and Method: Oral airway Placement Confirmation: positive ETCO2 and breath sounds checked- equal and bilateral Tube secured with: Tape Dental Injury: Teeth and Oropharynx as per pre-operative assessment        

## 2020-04-09 NOTE — Anesthesia Procedure Notes (Signed)
Procedure Name: Intubation Performed by: Fredderick Phenix, CRNA Pre-anesthesia Checklist: Patient identified, Emergency Drugs available, Suction available and Patient being monitored Patient Re-evaluated:Patient Re-evaluated prior to induction Oxygen Delivery Method: Circle system utilized Preoxygenation: Pre-oxygenation with 100% oxygen Induction Type: IV induction Ventilation: Mask ventilation without difficulty Laryngoscope Size: 4 and McGraph Grade View: Grade II Tube type: Oral Tube size: 7.0 mm Number of attempts: 1 Airway Equipment and Method: Stylet and Oral airway Placement Confirmation: ETT inserted through vocal cords under direct vision,  positive ETCO2 and breath sounds checked- equal and bilateral Secured at: 21 cm Tube secured with: Tape Dental Injury: Teeth and Oropharynx as per pre-operative assessment

## 2020-04-09 NOTE — H&P (Signed)
The patient has been re-examined, and the chart reviewed, and there have been no interval changes to the documented history and physical.    The risks, benefits, and alternatives have been discussed at length. The patient expressed understanding of the risks benefits and agreed with plans for surgical intervention.  Ramiro Pangilinan P. Shinichi Anguiano, Jr. M.D.    

## 2020-04-10 ENCOUNTER — Encounter: Payer: Self-pay | Admitting: Orthopedic Surgery

## 2020-04-10 NOTE — Anesthesia Postprocedure Evaluation (Signed)
Anesthesia Post Note  Patient: Dominique Kerr  Procedure(s) Performed: ARTHROSCOPY KNEE (Left Knee)  Patient location during evaluation: PACU Anesthesia Type: General Level of consciousness: awake and alert Pain management: pain level controlled Vital Signs Assessment: post-procedure vital signs reviewed and stable Respiratory status: spontaneous breathing, nonlabored ventilation, respiratory function stable and patient connected to nasal cannula oxygen Cardiovascular status: blood pressure returned to baseline and stable Postop Assessment: no apparent nausea or vomiting Anesthetic complications: no   No complications documented.   Last Vitals:  Vitals:   04/09/20 1645 04/09/20 1720  BP: (!) 144/78 (!) 145/80  Pulse: 74 81  Resp: 14 18  Temp: 36.5 C (!) 36 C  SpO2: 94% 97%    Last Pain:  Vitals:   04/09/20 1720  TempSrc: Temporal  PainSc: 0-No pain                 Molli Barrows

## 2020-05-12 ENCOUNTER — Encounter: Payer: BC Managed Care – PPO | Admitting: Nurse Practitioner

## 2020-05-22 ENCOUNTER — Ambulatory Visit (INDEPENDENT_AMBULATORY_CARE_PROVIDER_SITE_OTHER): Payer: BC Managed Care – PPO | Admitting: Nurse Practitioner

## 2020-05-22 ENCOUNTER — Other Ambulatory Visit: Payer: Self-pay

## 2020-05-22 ENCOUNTER — Encounter: Payer: Self-pay | Admitting: Nurse Practitioner

## 2020-05-22 VITALS — BP 118/82 | HR 75 | Temp 98.4°F | Ht 66.0 in | Wt 203.8 lb

## 2020-05-22 DIAGNOSIS — Z0000 Encounter for general adult medical examination without abnormal findings: Secondary | ICD-10-CM | POA: Diagnosis not present

## 2020-05-22 DIAGNOSIS — K219 Gastro-esophageal reflux disease without esophagitis: Secondary | ICD-10-CM

## 2020-05-22 DIAGNOSIS — E559 Vitamin D deficiency, unspecified: Secondary | ICD-10-CM

## 2020-05-22 DIAGNOSIS — Z23 Encounter for immunization: Secondary | ICD-10-CM

## 2020-05-22 DIAGNOSIS — F32A Depression, unspecified: Secondary | ICD-10-CM

## 2020-05-22 DIAGNOSIS — Z79890 Hormone replacement therapy (postmenopausal): Secondary | ICD-10-CM

## 2020-05-22 DIAGNOSIS — R7301 Impaired fasting glucose: Secondary | ICD-10-CM

## 2020-05-22 DIAGNOSIS — F419 Anxiety disorder, unspecified: Secondary | ICD-10-CM

## 2020-05-22 DIAGNOSIS — E039 Hypothyroidism, unspecified: Secondary | ICD-10-CM

## 2020-05-22 DIAGNOSIS — E7800 Pure hypercholesterolemia, unspecified: Secondary | ICD-10-CM

## 2020-05-22 MED ORDER — LEVOTHYROXINE SODIUM 150 MCG PO TABS
150.0000 ug | ORAL_TABLET | Freq: Every day | ORAL | 4 refills | Status: DC
Start: 2020-05-22 — End: 2020-05-23

## 2020-05-22 MED ORDER — OMEPRAZOLE 20 MG PO CPDR: 20.0000 mg | DELAYED_RELEASE_CAPSULE | Freq: Every day | ORAL | 4 refills | Status: DC

## 2020-05-22 MED ORDER — SERTRALINE HCL 100 MG PO TABS: 100.0000 mg | ORAL_TABLET | Freq: Every day | ORAL | 4 refills | Status: DC

## 2020-05-22 NOTE — Assessment & Plan Note (Signed)
Chronic, ongoing.  Continue Prilosec daily and recommend trial of a slow reduction off, if symptoms return then restart.  Check Mag level today.

## 2020-05-22 NOTE — Assessment & Plan Note (Signed)
Followed by GYN, continue current regimen as prescribed by them. 

## 2020-05-22 NOTE — Assessment & Plan Note (Signed)
Check lipid panel today and initiate meds as needed.  Current ASCVD 2.3%.

## 2020-05-22 NOTE — Assessment & Plan Note (Signed)
Recent labs show glucose 80-90 range.  A1c 5.7% last visit, recheck today and continue diet control.

## 2020-05-22 NOTE — Assessment & Plan Note (Signed)
Chronic, stable on current regimen.  Denies SI/HI.  Continue current medication regimen and adjust as needed.  Refills sent.

## 2020-05-22 NOTE — Addendum Note (Signed)
Addended by: Venita Lick on: 05/22/2020 08:48 AM   Modules accepted: Orders

## 2020-05-22 NOTE — Patient Instructions (Signed)
Healthy Eating Following a healthy eating pattern may help you to achieve and maintain a healthy body weight, reduce the risk of chronic disease, and live a long and productive life. It is important to follow a healthy eating pattern at an appropriate calorie level for your body. Your nutritional needs should be met primarily through food by choosing a variety of nutrient-rich foods. What are tips for following this plan? Reading food labels  Read labels and choose the following: ? Reduced or low sodium. ? Juices with 100% fruit juice. ? Foods with low saturated fats and high polyunsaturated and monounsaturated fats. ? Foods with whole grains, such as whole wheat, cracked wheat, brown rice, and wild rice. ? Whole grains that are fortified with folic acid. This is recommended for women who are pregnant or who want to become pregnant.  Read labels and avoid the following: ? Foods with a lot of added sugars. These include foods that contain brown sugar, corn sweetener, corn syrup, dextrose, fructose, glucose, high-fructose corn syrup, honey, invert sugar, lactose, malt syrup, maltose, molasses, raw sugar, sucrose, trehalose, or turbinado sugar.  Do not eat more than the following amounts of added sugar per day:  6 teaspoons (25 g) for women.  9 teaspoons (38 g) for men. ? Foods that contain processed or refined starches and grains. ? Refined grain products, such as white flour, degermed cornmeal, white bread, and white rice. Shopping  Choose nutrient-rich snacks, such as vegetables, whole fruits, and nuts. Avoid high-calorie and high-sugar snacks, such as potato chips, fruit snacks, and candy.  Use oil-based dressings and spreads on foods instead of solid fats such as butter, stick margarine, or cream cheese.  Limit pre-made sauces, mixes, and "instant" products such as flavored rice, instant noodles, and ready-made pasta.  Try more plant-protein sources, such as tofu, tempeh, black beans,  edamame, lentils, nuts, and seeds.  Explore eating plans such as the Mediterranean diet or vegetarian diet. Cooking  Use oil to saut or stir-fry foods instead of solid fats such as butter, stick margarine, or lard.  Try baking, boiling, grilling, or broiling instead of frying.  Remove the fatty part of meats before cooking.  Steam vegetables in water or broth. Meal planning  At meals, imagine dividing your plate into fourths: ? One-half of your plate is fruits and vegetables. ? One-fourth of your plate is whole grains. ? One-fourth of your plate is protein, especially lean meats, poultry, eggs, tofu, beans, or nuts.  Include low-fat dairy as part of your daily diet.   Lifestyle  Choose healthy options in all settings, including home, work, school, restaurants, or stores.  Prepare your food safely: ? Wash your hands after handling raw meats. ? Keep food preparation surfaces clean by regularly washing with hot, soapy water. ? Keep raw meats separate from ready-to-eat foods, such as fruits and vegetables. ? Cook seafood, meat, poultry, and eggs to the recommended internal temperature. ? Store foods at safe temperatures. In general:  Keep cold foods at 7F (4.4C) or below.  Keep hot foods at 17F (60C) or above.  Keep your freezer at [facility] (-17.8C) or below.  Foods are no longer safe to eat when they have been between the temperatures of 40-17F (4.4-60C) for more than 2 hours. What foods should I eat? Fruits Aim to eat 2 cup-equivalents of fresh, canned (in natural juice), or frozen fruits each day. Examples of 1 cup-equivalent of fruit include 1 small apple, 8 large strawberries, 1 cup canned fruit,  cup dried fruit, or 1 cup 100% juice. Vegetables Aim to eat 2-3 cup-equivalents of fresh and frozen vegetables each day, including different varieties and colors. Examples of 1 cup-equivalent of vegetables include 2 medium carrots, 2 cups raw, leafy greens, 1 cup chopped  vegetable (raw or cooked), or 1 medium baked potato. Grains Aim to eat 6 ounce-equivalents of whole grains each day. Examples of 1 ounce-equivalent of grains include 1 slice of bread, 1 cup ready-to-eat cereal, 3 cups popcorn, or  cup cooked rice, pasta, or cereal. Meats and other proteins Aim to eat 5-6 ounce-equivalents of protein each day. Examples of 1 ounce-equivalent of protein include 1 egg, 1/2 cup nuts or seeds, or 1 tablespoon (16 g) peanut butter. A cut of meat or fish that is the size of a deck of cards is about 3-4 ounce-equivalents.  Of the protein you eat each week, try to have at least 8 ounces come from seafood. This includes salmon, trout, herring, and anchovies. Dairy Aim to eat 3 cup-equivalents of fat-free or low-fat dairy each day. Examples of 1 cup-equivalent of dairy include 1 cup (240 mL) milk, 8 ounces (250 g) yogurt, 1 ounces (44 g) natural cheese, or 1 cup (240 mL) fortified soy milk. Fats and oils  Aim for about 5 teaspoons (21 g) per day. Choose monounsaturated fats, such as canola and olive oils, avocados, peanut butter, and most nuts, or polyunsaturated fats, such as sunflower, corn, and soybean oils, walnuts, pine nuts, sesame seeds, sunflower seeds, and flaxseed. Beverages  Aim for six 8-oz glasses of water per day. Limit coffee to three to five 8-oz cups per day.  Limit caffeinated beverages that have added calories, such as soda and energy drinks.  Limit alcohol intake to no more than 1 drink a day for nonpregnant women and 2 drinks a day for men. One drink equals 12 oz of beer (355 mL), 5 oz of wine (148 mL), or 1 oz of hard liquor (44 mL). Seasoning and other foods  Avoid adding excess amounts of salt to your foods. Try flavoring foods with herbs and spices instead of salt.  Avoid adding sugar to foods.  Try using oil-based dressings, sauces, and spreads instead of solid fats. This information is based on general [place] nutrition guidelines. For more  information, visit choosemyplate.gov. Exact amounts may vary based on your nutrition needs. Summary  A healthy eating plan may help you to maintain a healthy weight, reduce the risk of chronic diseases, and stay active throughout your life.  Plan your meals. Make sure you eat the right portions of a variety of nutrient-rich foods.  Try baking, boiling, grilling, or broiling instead of frying.  Choose healthy options in all settings, including home, work, school, restaurants, or stores. This information is not intended to replace advice given to you by your health care provider. Make sure you discuss any questions you have with your health care provider. Document Revised: 05/23/2017 Document Reviewed: 05/23/2017 Elsevier Patient Education  2021 [employer]  

## 2020-05-22 NOTE — Assessment & Plan Note (Signed)
Ongoing, continue supplement and check Vit D level next visit.  Plan on DEXA at age 60, discussed with patient.

## 2020-05-22 NOTE — Progress Notes (Addendum)
BP 118/82 (BP Location: Left Arm)   Pulse 75   Temp 98.4 F (36.9 C) (Oral)   Ht 5\' 6"  (1.676 m)   Wt 203 lb 12.8 oz (92.4 kg)   LMP  (LMP Unknown)   SpO2 95%   BMI 32.89 kg/m    Subjective:    Patient ID: Dominique Kerr, female    DOB: Jan 17, 1961, 60 y.o.   MRN: [id number]  HPI: Dominique Kerr is a 60 y.o. female presenting on 05/22/2020 for comprehensive medical examination. 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  HYPOTHYROIDISM Continues on Synthroid 150 MCG daily.  Last TSH 0.632.  Has never had thyroid antibody testing to check for Hashimoto's. Thyroid control status:stable Satisfied with current treatment? yes Medication side effects: no Medication compliance: good compliance Etiology of hypothyroidism: unknown Recent dose adjustment:no Fatigue: no Cold intolerance: no Heat intolerance: no Weight gain: no Weight loss: no Constipation: no Diarrhea/loose stools: no Palpitations: no Lower extremity edema: no Anxiety/depressed mood: no   HYPERLIPIDEMIA No current medications.  Taking Prilosec once a day for heart burn, which offers her benefit.   Hyperlipidemia status: good compliance Supplements: none Aspirin:  yes The 10-year ASCVD risk score Mikey Bussing DC Jr., et al., 2013) is: 2.3%   Values used to calculate the score:     Age: 60 years     Sex: Female     Is Non-Hispanic African American: No     Diabetic: No     Tobacco smoker: No     Systolic Blood Pressure: 889 mmHg     Is BP treated: No     HDL Cholesterol: 68 mg/dL     Total Cholesterol: 273 mg/dL Chest pain:  no Coronary artery disease:  no Family history CAD:  no Family history early CAD:  no  DEPRESSION Continues on Sertraline 100 MG. Mood status: stable Satisfied with current treatment?:  yes Symptom severity: mild  Duration of current treatment : chronic Side effects: no Medication compliance: good compliance Psychotherapy/counseling: yes in the past Previous psychiatric medications: Zoloft Depressed mood: no Anxious mood: no Anhedonia: no Significant weight loss or gain: no Insomnia: none Fatigue: no Feelings of worthlessness or guilt: no Impaired concentration/indecisiveness: no Suicidal ideations: no Hopelessness: no Crying spells: no Depression screen [facility] 2/9 05/22/2020 11/12/2019 05/08/2019 11/03/2018 05/03/2018  Decreased Interest 0 0 0 0 0  Down, Depressed, Hopeless 0 1 1 0 0  PHQ - 2 Score 0 1 1 0 0  Altered sleeping 0 0 0 0 0  Tired, decreased energy 0 1 0 0 1  Change in appetite 0 0 1 0 1  Feeling bad or failure about yourself  0 0 0 0 0  Trouble concentrating 0 0 0 0 0  Moving slowly or fidgety/restless 0 0 0 0 0  Suicidal thoughts 0 0 0 0 0  PHQ-9 Score 0 2 2 0 2  Difficult doing work/chores - Not difficult at all Not difficult at all Not difficult at all Not difficult at all    The patient does not have a history of falls. I did not complete a risk assessment for falls. A plan of care for falls was not documented.   Past Medical History:  Past Medical History:  Diagnosis Date  . Allergy   . Anemia  YEARS AGO WHILE HAVING PERIODS  . Anxiety   . Arthritis   . GERD (gastroesophageal reflux disease)   . HSV (herpes simplex virus) infection   . Hyperlipidemia   . Hypertension    H/O LOST WEIGHT AND NO MEDS X 6 YRS   . Obesity   . PMDD (premenstrual dysphoric disorder)   . Pre-diabetes    PRIOR TO GASTRIC SLEEVE  . Sleep apnea    DOES NOT USE CPAP  . Thyroid disease     Surgical History:  Past Surgical History:  Procedure Laterality Date  . BLADDER SURGERY    . CHOLECYSTECTOMY    . KNEE ARTHROSCOPY Left   . KNEE ARTHROSCOPY Left 04/09/2020   Procedure: ARTHROSCOPY KNEE;  Surgeon: Dereck Leep, MD;  Location: [facility] ORS;  Service:  Orthopedics;  Laterality: Left;  . LAPAROSCOPIC GASTRIC SLEEVE RESECTION    . parotid gland removal      Medications:  Current Outpatient Medications on File Prior to Visit  Medication Sig  . aspirin EC 81 MG tablet Take 81 mg by mouth daily.   . Cholecalciferol (VITAMIN D) 50 MCG (2000 UT) tablet Take 2,000 Units by mouth daily.  . diphenhydramine-acetaminophen (TYLENOL PM) 25-500 MG TABS tablet Take 2 tablets by mouth at bedtime as needed (sleep).  Marland Kitchen estradiol (CLIMARA - DOSED IN MG/24 HR) 0.05 mg/24hr patch Place 0.05 mg onto the skin every Friday.  Marland Kitchen ibuprofen (ADVIL) 200 MG tablet Take 400 mg by mouth daily.  Marland Kitchen loratadine (CLARITIN) 10 MG tablet Take 10 mg by mouth every morning.  . Multiple Vitamin (MULTIVITAMIN WITH MINERALS) TABS tablet Take 1 tablet by mouth daily.  . progesterone (PROMETRIUM) 200 MG capsule Take 200 mg by mouth at bedtime.   No current facility-administered medications on file prior to visit.    Allergies:  Allergies  Allergen Reactions  . Lisinopril Cough    Social History:  Social History   Socioeconomic History  . Marital status: Single    Spouse name: Not on file  . Number of children: Not on file  . Years of education: Not on file  . Highest education level: Not on file  Occupational History  . Not on file  Tobacco Use  . Smoking status: Former Smoker    Packs/day: 0.25    Years: 3.00    Pack years: 0.75    Types: Cigarettes    Quit date: 03/31/2000    Years since quitting: 20.1  . Smokeless tobacco: Never Used  Vaping Use  . Vaping Use: Never used  Substance and Sexual Activity  . Alcohol use: Yes    Alcohol/week: 0.0 standard drinks    Comment: RARELY  . Drug use: No  . Sexual activity: Never  Other Topics Concern  . Not on file  Social History Narrative  . Not on file   Social Determinants of Health   Financial Resource Strain: Not on file  Food Insecurity: Not on file  Transportation Needs: Not on file  Physical  Activity: Not on file  Stress: Not on file  Social Connections: Not on file  Intimate Partner Violence: Not on file   Social History   Tobacco Use  Smoking Status Former Smoker  . Packs/day: 0.25  . Years: 3.00  . Pack years: 0.75  . Types: Cigarettes  . Quit date: 03/31/2000  . Years since quitting: 20.1  Smokeless Tobacco Never Used   Social History   Substance and Sexual Activity  Alcohol Use Yes  .  Alcohol/week: 0.0 standard drinks   Comment: RARELY    Family History:  Family History  Problem Relation Age of Onset  . Diabetes Mother   . Hepatitis C Mother   . Arthritis Sister   . Diabetes Maternal Grandmother     Past medical history, surgical history, medications, allergies, family history and social history reviewed with patient today and changes made to appropriate areas of the chart.   Review of Systems - negative All other ROS negative except what is listed above and in the HPI.      Objective:    BP 118/82 (BP Location: Left Arm)   Pulse 75   Temp 98.4 F (36.9 C) (Oral)   Ht 5\' 6"  (1.676 m)   Wt 203 lb 12.8 oz (92.4 kg)   LMP  (LMP Unknown)   SpO2 95%   BMI 32.89 kg/m   Wt Readings from Last 3 Encounters:  05/22/20 203 lb 12.8 oz (92.4 kg)  04/09/20 203 lb 7.8 oz (92.3 kg)  11/12/19 206 lb (93.4 kg)    Physical Exam Constitutional:      General: She is awake. She is not in acute distress.    Appearance: She is well-developed. She is not ill-appearing.  HENT:     Head: Normocephalic and atraumatic.     Right Ear: Hearing, tympanic membrane, ear canal and external ear normal. No drainage.     Left Ear: Hearing, tympanic membrane, ear canal and external ear normal. No drainage.     Nose: Nose normal.     Right Sinus: No maxillary sinus tenderness or frontal sinus tenderness.     Left Sinus: No maxillary sinus tenderness or frontal sinus tenderness.     Mouth/Throat:     Mouth: Mucous membranes are moist.     Pharynx: Oropharynx is clear.  Uvula midline. No pharyngeal swelling, oropharyngeal exudate or posterior oropharyngeal erythema.  Eyes:     General: Lids are normal.        Right eye: No discharge.        Left eye: No discharge.     Extraocular Movements: Extraocular movements intact.     Conjunctiva/sclera: Conjunctivae normal.     Pupils: Pupils are equal, round, and reactive to light.     Visual Fields: Right eye visual fields normal and left eye visual fields normal.  Neck:     Thyroid: No thyromegaly.     Vascular: No carotid bruit.     Trachea: Trachea normal.  Cardiovascular:     Rate and Rhythm: Normal rate and regular rhythm.     Heart sounds: Normal heart sounds. No murmur heard. No gallop.   Pulmonary:     Effort: Pulmonary effort is normal. No accessory muscle usage or respiratory distress.     Breath sounds: Normal breath sounds.  Abdominal:     General: Bowel sounds are normal.     Palpations: Abdomen is soft. There is no hepatomegaly or splenomegaly.     Tenderness: There is no abdominal tenderness.  Musculoskeletal:        General: Normal range of motion.     Cervical back: Normal range of motion and neck supple.     Right lower leg: No edema.     Left lower leg: No edema.  Lymphadenopathy:     Head:     Right side of head: No submental, submandibular, tonsillar, preauricular or posterior auricular adenopathy.     Left side of head: No submental, submandibular, tonsillar, preauricular  or posterior auricular adenopathy.     Cervical: No cervical adenopathy.  Skin:    General: Skin is warm and dry.     Capillary Refill: Capillary refill takes less than 2 seconds.     Findings: No rash.  Neurological:     Mental Status: She is alert and oriented to person, place, and time.     Cranial Nerves: Cranial nerves are intact.     Gait: Gait is intact.     Deep Tendon Reflexes: Reflexes are normal and symmetric.     Reflex Scores:      Brachioradialis reflexes are 2+ on the right side and 2+ on the  left side.      Patellar reflexes are 2+ on the right side and 2+ on the left side. Psychiatric:        Attention and Perception: Attention normal.        Mood and Affect: Mood normal.        Speech: Speech normal.        Behavior: Behavior normal. Behavior is cooperative.        Thought Content: Thought content normal.        Judgment: Judgment normal.    Results for orders placed or performed during the hospital encounter of 04/07/20  SARS CORONAVIRUS 2 (TAT 6-24 HRS) Nasopharyngeal Nasopharyngeal Swab   Specimen: Nasopharyngeal Swab  Result Value Ref Range   SARS Coronavirus 2 NEGATIVE NEGATIVE      Assessment & Plan:   Problem List Items Addressed This Visit      Digestive   GERD (gastroesophageal reflux disease)    Chronic, ongoing.  Continue Prilosec daily and recommend trial of a slow reduction off, if symptoms return then restart.  Check Mag level today.       Relevant Medications   omeprazole (PRILOSEC) 20 MG capsule   Other Relevant Orders   CBC with Differential/Platelet   Magnesium     Endocrine   Adult hypothyroidism - Primary    Chronic, ongoing.  Continue current medication dose and adjust as needed based on TSH/T4 levels. Labs today: TSH, Free T4, and antibodies.  Refills sent in.  Return in 6 months for follow-up.      Relevant Medications   levothyroxine (SYNTHROID) 150 MCG tablet   Other Relevant Orders   TSH   T4, free   Thyroid peroxidase antibody   IFG (impaired fasting glucose)    Recent labs show glucose 80-90 range.  A1c 5.7% last visit, recheck today and continue diet control.      Relevant Orders   Hemoglobin A1c     Other   HLD (hyperlipidemia)    Check lipid panel today and initiate meds as needed.  Current ASCVD 2.3%.      Relevant Orders   Comprehensive metabolic panel   Lipid Panel w/o Chol/HDL Ratio   Anxiety and depression    Chronic, stable on current regimen.  Denies SI/HI.  Continue current medication regimen and adjust  as needed.  Refills sent.      Relevant Medications   sertraline (ZOLOFT) 100 MG tablet   Vitamin D deficiency    Ongoing, continue supplement and check Vit D level next visit.  Plan on DEXA at age 42, discussed with patient.      Postmenopausal HRT (hormone replacement therapy)    Followed by GYN, continue current regimen as prescribed by them.       Other Visit Diagnoses    Encounter for annual physical  exam       Annual labs today to include CBC, CMP, TSH, lipid.  Health maintenance reviewed with patient.   Need for Tdap vaccination       Tdap provided today, is due.   Relevant Orders   Tdap vaccine greater than or equal to 7yo IM       Follow up plan: Return in about 6 months (around 11/21/2020) for Hypothyroid and HLD.   LABORATORY TESTING:  - Pap smear: up to date  IMMUNIZATIONS:   - Tdap: Tetanus vaccination status reviewed: provided today - Influenza: Up to date - Pneumovax: Not applicable - Prevnar: Not applicable - HPV: Not applicable - Zostavax vaccine: wishes to think about this  SCREENING: -Mammogram: Up to date  - Colonoscopy: Up to date  - Bone Density: Not applicable  -Hearing Test: Not applicable  -Spirometry: Not applicable   PATIENT COUNSELING:   Advised to take 1 mg of folate supplement per day if capable of pregnancy.   Sexuality: Discussed sexually transmitted diseases, partner selection, use of condoms, avoidance of unintended pregnancy  and contraceptive alternatives.   Advised to avoid cigarette smoking.  I discussed with the patient that most people either abstain from alcohol or drink within safe limits (<=14/week and <=4 drinks/occasion for males, <=7/weeks and <= 3 drinks/occasion for females) and that the risk for alcohol disorders and other health effects rises proportionally with the number of drinks per week and how often a drinker exceeds daily limits.  Discussed cessation/primary prevention of drug use and availability of  treatment for abuse.   Diet: Encouraged to adjust caloric intake to maintain  or achieve ideal body weight, to reduce intake of dietary saturated fat and total fat, to limit sodium intake by avoiding high sodium foods and not adding table salt, and to maintain adequate dietary potassium and calcium preferably from fresh fruits, vegetables, and low-fat dairy products.    Stressed the importance of regular exercise  Injury prevention: Discussed safety belts, safety helmets, smoke detector, smoking near bedding or upholstery.   Dental health: Discussed importance of regular tooth brushing, flossing, and dental visits.    NEXT PREVENTATIVE PHYSICAL DUE IN 1 YEAR. Return in about 6 months (around 11/21/2020) for Hypothyroid and HLD.

## 2020-05-22 NOTE — Assessment & Plan Note (Signed)
Chronic, ongoing.  Continue current medication dose and adjust as needed based on TSH/T4 levels. Labs today: TSH, Free T4, and antibodies.  Refills sent in.  Return in 6 months for follow-up.

## 2020-05-23 ENCOUNTER — Other Ambulatory Visit: Payer: Self-pay | Admitting: Nurse Practitioner

## 2020-05-23 DIAGNOSIS — E7800 Pure hypercholesterolemia, unspecified: Secondary | ICD-10-CM

## 2020-05-23 DIAGNOSIS — E039 Hypothyroidism, unspecified: Secondary | ICD-10-CM

## 2020-05-23 LAB — COMPREHENSIVE METABOLIC PANEL
ALT: 13 IU/L (ref 0–32)
AST: 22 IU/L (ref 0–40)
Albumin/Globulin Ratio: 2.1 (ref 1.2–2.2)
Albumin: 4.5 g/dL (ref 3.8–4.9)
Alkaline Phosphatase: 78 IU/L (ref 44–121)
BUN/Creatinine Ratio: 24 — ABNORMAL HIGH (ref 9–23)
BUN: 17 mg/dL (ref 6–24)
Bilirubin Total: 0.3 mg/dL (ref 0.0–1.2)
CO2: 23 mmol/L (ref 20–29)
Calcium: 9.3 mg/dL (ref 8.7–10.2)
Chloride: 100 mmol/L (ref 96–106)
Creatinine, Ser: 0.72 mg/dL (ref 0.57–1.00)
Globulin, Total: 2.1 g/dL (ref 1.5–4.5)
Glucose: 94 mg/dL (ref 65–99)
Potassium: 4.4 mmol/L (ref 3.5–5.2)
Sodium: 139 mmol/L (ref 134–144)
Total Protein: 6.6 g/dL (ref 6.0–8.5)
eGFR: 97 mL/min/{1.73_m2} (ref >59)

## 2020-05-23 LAB — TSH: TSH: 6.93 u[IU]/mL — ABNORMAL HIGH (ref 0.450–4.500)

## 2020-05-23 LAB — CBC WITH DIFFERENTIAL/PLATELET
Basophils Absolute: 0 10*3/uL (ref 0.0–0.2)
Basos: 1 %
EOS (ABSOLUTE): 0.2 10*3/uL (ref 0.0–0.4)
Eos: 4 %
Hematocrit: 38.3 % (ref 34.0–46.6)
Hemoglobin: 12.2 g/dL (ref 11.1–15.9)
Immature Grans (Abs): 0 10*3/uL (ref 0.0–0.1)
Immature Granulocytes: 0 %
Lymphocytes Absolute: 1.3 10*3/uL (ref 0.7–3.1)
Lymphs: 28 %
MCH: 27.7 pg (ref 26.6–33.0)
MCHC: 31.9 g/dL (ref 31.5–35.7)
MCV: 87 fL (ref 79–97)
Monocytes Absolute: 0.3 10*3/uL (ref 0.1–0.9)
Monocytes: 6 %
Neutrophils Absolute: 2.9 10*3/uL (ref 1.4–7.0)
Neutrophils: 61 %
Platelets: 318 10*3/uL (ref 150–450)
RBC: 4.4 x10E6/uL (ref 3.77–5.28)
RDW: 13.2 % (ref 11.7–15.4)
WBC: 4.8 10*3/uL (ref 3.4–10.8)

## 2020-05-23 LAB — MAGNESIUM: Magnesium: 2.4 mg/dL — ABNORMAL HIGH (ref 1.6–2.3)

## 2020-05-23 LAB — HEMOGLOBIN A1C
Est. average glucose Bld gHb Est-mCnc: 120 mg/dL
Hgb A1c MFr Bld: 5.8 % — ABNORMAL HIGH (ref 4.8–5.6)

## 2020-05-23 LAB — THYROID PEROXIDASE ANTIBODY: Thyroperoxidase Ab SerPl-aCnc: <8 IU/mL (ref 0–34)

## 2020-05-23 LAB — LIPID PANEL W/O CHOL/HDL RATIO
Cholesterol, Total: 316 mg/dL — ABNORMAL HIGH (ref 100–199)
HDL: 78 mg/dL (ref >39)
LDL Chol Calc (NIH): 227 mg/dL — ABNORMAL HIGH (ref 0–99)
Triglycerides: 76 mg/dL (ref 0–149)
VLDL Cholesterol Cal: 11 mg/dL (ref 5–40)

## 2020-05-23 LAB — T4, FREE: Free T4: 0.99 ng/dL (ref 0.82–1.77)

## 2020-05-23 MED ORDER — ATORVASTATIN CALCIUM 10 MG PO TABS: 10.0000 mg | ORAL_TABLET | Freq: Every day | ORAL | 3 refills | Status: DC

## 2020-05-23 MED ORDER — LEVOTHYROXINE SODIUM 175 MCG PO TABS: 175.0000 ug | ORAL_TABLET | Freq: Every day | ORAL | 4 refills | Status: DC

## 2020-05-23 NOTE — Progress Notes (Signed)
Contacted via MyChart The 10-year ASCVD risk score (Goff DC Jr., et al., 2013) is: 2.4%   Values used to calculate the score:     Age: 59 years     Sex: Female     Is Non-Hispanic African American: No     Diabetic: No     Tobacco smoker: No     Systolic Blood Pressure: 118 mmHg     Is BP treated: No     HDL Cholesterol: 78 mg/dL     Total Cholesterol: 316 mg/dL  Good morning Sabriyah, your labs have returned, please let me know you received this message and respond: - CBC shows normal levels with no anemia - Kidney function is normal, creatinine and eGFR, plus liver function is normal, AST and ALT - TSH is elevated with Free T4 trending down to lower side.  I would recommend we increase Levothyroxine to 175 MCG and would like you to return to office in 6 weeks for outpatient labs only to recheck this.  I will send in script for increase.  Please ensure to take this first thing in the morning prior to any food or medications.  Antibody for thyroid is normal, meaning no Hashimoto's. - Magnesium is mildly elevated, if you take any magnesium tablets at home I would reduce these and monitor foods high in magnesium. - A1c show prediabetic level at 5.8%, I recommend close focus on healthy diet and regular activity. - Cholesterol levels are significantly elevated from past check with LDL now 227, at this point I do recommend starting a statin at a low dose to help prevent stroke or heart attack.  I will send this in and would like you to start taking daily.  Main side effects can be muscle aches or fatigue, but with starting low this may help avoid these.  If they present let me know and on outpatient labs in 6 weeks will recheck levels.  Any questions?   Keep being awesome!!  Thank you for allowing me to participate in your care. Kindest regards, Jolene  

## 2020-07-08 ENCOUNTER — Other Ambulatory Visit: Payer: Self-pay

## 2020-07-08 ENCOUNTER — Encounter: Payer: Self-pay | Admitting: Nurse Practitioner

## 2020-07-08 ENCOUNTER — Ambulatory Visit (INDEPENDENT_AMBULATORY_CARE_PROVIDER_SITE_OTHER): Payer: BC Managed Care – PPO | Admitting: Nurse Practitioner

## 2020-07-08 VITALS — BP 118/75 | HR 64 | Temp 98.2°F | Wt 204.0 lb

## 2020-07-08 DIAGNOSIS — E039 Hypothyroidism, unspecified: Secondary | ICD-10-CM

## 2020-07-08 DIAGNOSIS — G4733 Obstructive sleep apnea (adult) (pediatric): Secondary | ICD-10-CM | POA: Diagnosis not present

## 2020-07-08 DIAGNOSIS — E7800 Pure hypercholesterolemia, unspecified: Secondary | ICD-10-CM

## 2020-07-08 DIAGNOSIS — K219 Gastro-esophageal reflux disease without esophagitis: Secondary | ICD-10-CM

## 2020-07-08 NOTE — Assessment & Plan Note (Signed)
Referral to [facility] neurology for sleep specialist placed.

## 2020-07-08 NOTE — Progress Notes (Signed)
BP 118/75   Pulse 64   Temp 98.2 F (36.8 C) (Oral)   Wt 204 lb (92.5 kg)   LMP  (LMP Unknown)   SpO2 96%   BMI 32.93 kg/m    Subjective:    Patient ID: Dominique Kerr, female    DOB: [birth year], 60 y.o.   MRN: [id number]  HPI: Dominique Kerr is a 60 y.o. female  Chief Complaint  Patient presents with  . Hyperlipidemia    Patient is here for a 6 week recheck on her cholesterol.   . Sleep Study    Patient states she has not used her machine in a while and would like to discuss having another sleep study done.    HYPERLIPIDEMIA Continues on Atorvastatin 10 MG daily -- started last visit. Hyperlipidemia status: good compliance Satisfied with current treatment?  yes Side effects:  no Medication compliance: good compliance Past cholesterol meds: unknown Supplements: none Aspirin:  yes The 10-year ASCVD risk score Mikey Bussing DC Jr., et al., 2013) is: 2.4%   Values used to calculate the score:     Age: 72 years     Sex: Female     Is Non-Hispanic African American: No     Diabetic: No     Tobacco smoker: No     Systolic Blood Pressure: 212 mmHg     Is BP treated: No     HDL Cholesterol: 78 mg/dL     Total Cholesterol: 316 mg/dL Chest pain:  no Coronary artery disease:  no Family history CAD:  no Family history early CAD:  no   SLEEP APNEA Has CPAP at home, last sleep study 7 years ago.  Has not used her equipment in years. Sleep apnea status: uncontrolled Duration: chronic Satisfied with current treatment?:  yes CPAP use:  no Sleep quality with CPAP use: good", average Treament compliance:poor compliance Last sleep study: 7 years ago Treatments attempted: CPAP Wakes feeling refreshed:  sometimes Daytime hypersomnolence:  no Fatigue:  sometimes Insomnia:  no Good sleep hygiene:  yes Difficulty falling asleep:  no Difficulty staying asleep:  no Snoring bothers bed partner:  no Observed apnea by bed partner: no Obesity:  yes Hypertension: no   Pulmonary hypertension:  no Coronary artery disease:  no   HYPOTHYROIDISM Recent levels mild elevated.  Taking 175 MCG.  Recent Mag level mildly elevated -- takes Prilosec for GERD. Thyroid control status:stable Satisfied with current treatment? yes Medication side effects: no Medication compliance: good compliance Etiology of hypothyroidism: unknown Recent dose adjustment:no Fatigue: no Cold intolerance: no Heat intolerance: no Weight gain: no Weight loss: no Constipation: no Diarrhea/loose stools: no Palpitations: no Lower extremity edema: no Anxiety/depressed mood: no  Relevant past medical, surgical, family and social history reviewed and updated as indicated. Interim medical history since our last visit reviewed. Allergies and medications reviewed and updated.  Review of Systems  Constitutional: Negative.   Respiratory: Negative.   Cardiovascular: Negative.   Gastrointestinal: Negative.   Musculoskeletal: Negative.   Neurological: Negative.   Psychiatric/Behavioral: Negative.     Per HPI unless specifically indicated above     Objective:    BP 118/75   Pulse 64   Temp 98.2 F (36.8 C) (Oral)   Wt 204 lb (92.5 kg)   LMP  (LMP Unknown)   SpO2 96%   BMI 32.93 kg/m   Wt Readings from Last 3 Encounters:  07/08/20 204 lb (92.5 kg)  05/22/20 203 lb 12.8 oz (92.4 kg)  04/09/20 203  lb 7.8 oz (92.3 kg)    Physical Exam Vitals and nursing note reviewed.  Constitutional:      General: She is awake. She is not in acute distress.    Appearance: She is well-developed. She is not ill-appearing.  HENT:     Head: Normocephalic.     Right Ear: Hearing normal.     Left Ear: Hearing normal.  Eyes:     General: Lids are normal.        Right eye: No discharge.        Left eye: No discharge.     Conjunctiva/sclera: Conjunctivae normal.     Pupils: Pupils are equal, round, and reactive to light.  Neck:     Thyroid: No thyromegaly.     Vascular: No carotid bruit.   Cardiovascular:     Rate and Rhythm: Normal rate and regular rhythm.     Heart sounds: Normal heart sounds. No murmur heard. No gallop.   Pulmonary:     Effort: Pulmonary effort is normal. No accessory muscle usage or respiratory distress.     Breath sounds: Normal breath sounds.  Abdominal:     General: Bowel sounds are normal.     Palpations: Abdomen is soft.  Musculoskeletal:     Cervical back: Normal range of motion and neck supple.     Right lower leg: No edema.     Left lower leg: No edema.  Lymphadenopathy:     Cervical: No cervical adenopathy.  Skin:    General: Skin is warm and dry.  Neurological:     Mental Status: She is alert and oriented to person, place, and time.  Psychiatric:        Attention and Perception: Attention normal.        Mood and Affect: Mood normal.        Speech: Speech normal.        Behavior: Behavior normal. Behavior is cooperative.        Thought Content: Thought content normal.        Judgment: Judgment normal.     Results for orders placed or performed in visit on 05/22/20  CBC with Differential/Platelet  Result Value Ref Range   WBC 4.8 3.4 - 10.8 x10E3/uL   RBC 4.40 3.77 - 5.28 x10E6/uL   Hemoglobin 12.2 11.1 - 15.9 g/dL   Hematocrit 38.3 34.0 - 46.6 %   MCV 87 79 - 97 fL   MCH 27.7 26.6 - 33.0 pg   MCHC 31.9 31.5 - 35.7 g/dL   RDW 13.2 11.7 - 15.4 %   Platelets 318 150 - 450 x10E3/uL   Neutrophils 61 Not Estab. %   Lymphs 28 Not Estab. %   Monocytes 6 Not Estab. %   Eos 4 Not Estab. %   Basos 1 Not Estab. %   Neutrophils Absolute 2.9 1.4 - 7.0 x10E3/uL   Lymphocytes Absolute 1.3 0.7 - 3.1 x10E3/uL   Monocytes Absolute 0.3 0.1 - 0.9 x10E3/uL   EOS (ABSOLUTE) 0.2 0.0 - 0.4 x10E3/uL   Basophils Absolute 0.0 0.0 - 0.2 x10E3/uL   Immature Granulocytes 0 Not Estab. %   Immature Grans (Abs) 0.0 0.0 - 0.1 x10E3/uL  Comprehensive metabolic panel  Result Value Ref Range   Glucose 94 65 - 99 mg/dL   BUN 17 6 - 24 mg/dL    Creatinine, Ser 0.72 0.57 - 1.00 mg/dL   eGFR 97 >59 mL/min/1.73   BUN/Creatinine Ratio 24 (H) 9 - 23  Sodium 139 134 - 144 mmol/L   Potassium 4.4 3.5 - 5.2 mmol/L   Chloride 100 96 - 106 mmol/L   CO2 23 20 - 29 mmol/L   Calcium 9.3 8.7 - 10.2 mg/dL   Total Protein 6.6 6.0 - 8.5 g/dL   Albumin 4.5 3.8 - 4.9 g/dL   Globulin, Total 2.1 1.5 - 4.5 g/dL   Albumin/Globulin Ratio 2.1 1.2 - 2.2   Bilirubin Total 0.3 0.0 - 1.2 mg/dL   Alkaline Phosphatase 78 44 - 121 IU/L   AST 22 0 - 40 IU/L   ALT 13 0 - 32 IU/L  Lipid Panel w/o Chol/HDL Ratio  Result Value Ref Range   Cholesterol, Total 316 (H) 100 - 199 mg/dL   Triglycerides 76 0 - 149 mg/dL   HDL 78 >39 mg/dL   VLDL Cholesterol Cal 11 5 - 40 mg/dL   LDL Chol Calc (NIH) 227 (H) 0 - 99 mg/dL  TSH  Result Value Ref Range   TSH 6.930 (H) 0.450 - 4.500 uIU/mL  T4, free  Result Value Ref Range   Free T4 0.99 0.82 - 1.77 ng/dL  Magnesium  Result Value Ref Range   Magnesium 2.4 (H) 1.6 - 2.3 mg/dL  Hemoglobin A1c  Result Value Ref Range   Hgb A1c MFr Bld 5.8 (H) 4.8 - 5.6 %   Est. average glucose Bld gHb Est-mCnc 120 mg/dL  Thyroid peroxidase antibody  Result Value Ref Range   Thyroperoxidase Ab SerPl-aCnc <8 0 - 34 IU/mL      Assessment & Plan:   Problem List Items Addressed This Visit      Respiratory   Obstructive sleep apnea of adult    Referral to [facility] neurology for sleep specialist placed.      Relevant Orders   Ambulatory referral to Sleep Studies     Digestive   GERD (gastroesophageal reflux disease)    Chronic, ongoing.  Continue Prilosec daily and recommend trial of a slow reduction off, if symptoms return then restart.  Check Mag level today -- as recent level mildly elevated.        Endocrine   Adult hypothyroidism - Primary    Chronic, ongoing.  Continue current medication dose and adjust as needed based on TSH/T4 levels. Labs today: TSH, Free T4, and antibodies.  Refills sent in last visit.  Return  in 6 months for follow-up.      Relevant Orders   T4, free   TSH   Thyroid peroxidase antibody     Other   HLD (hyperlipidemia)    Chronic, ongoing, tolerating Atorvastatin which was started last visit.  Continue regimen and adjust as needed. Lipid panel today.      Relevant Orders   Lipid Panel w/o Chol/HDL Ratio   Comprehensive metabolic panel    Other Visit Diagnoses    Hypermagnesemia       Check mag level today, last was 2.4.   Relevant Orders   Magnesium       Follow up plan: Return in about 6 months (around 01/08/2021).

## 2020-07-08 NOTE — Assessment & Plan Note (Signed)
Chronic, ongoing.  Continue current medication dose and adjust as needed based on TSH/T4 levels. Labs today: TSH, Free T4, and antibodies.  Refills sent in last visit.  Return in 6 months for follow-up.

## 2020-07-08 NOTE — Patient Instructions (Signed)
Preventing High Cholesterol Cholesterol is a white, waxy substance similar to fat that the human body needs to help build cells. The liver makes all the cholesterol that a person's body needs. Having high cholesterol (hypercholesterolemia) increases your risk for heart disease and stroke. Extra or excess cholesterol comes from the food that you eat. High cholesterol can often be prevented with diet and lifestyle changes. If you already have high cholesterol, you can control it with diet, lifestyle changes, and medicines. How can high cholesterol affect me? If you have high cholesterol, fatty deposits (plaques) may build up on the walls of your blood vessels. The blood vessels that carry blood away from your heart are called arteries. Plaques make the arteries narrower and stiffer. This in turn can:  Restrict or block blood flow and cause blood clots to form.  Increase your risk for heart attack and stroke. What can increase my risk for high cholesterol? This condition is more likely to develop in people who:  Eat foods that are high in saturated fat or cholesterol. Saturated fat is mostly found in foods that come from animal sources.  Are overweight.  Are not getting enough exercise.  Have a family history of high cholesterol (familial hypercholesterolemia). What actions can I take to prevent this? Nutrition  Eat less saturated fat.  Avoid trans fats (partially hydrogenated oils). These are often found in margarine and in some baked goods, fried foods, and snacks bought in packages.  Avoid precooked or cured meat, such as bacon, sausages, or meat loaves.  Avoid foods and drinks that have added sugars.  Eat more fruits, vegetables, and whole grains.  Choose healthy sources of protein, such as fish, poultry, lean cuts of red meat, beans, peas, lentils, and nuts.  Choose healthy sources of fat, such as: ? Nuts. ? Vegetable oils, especially olive oil. ? Fish that have healthy fats,  such as omega-3 fatty acids. These fish include mackerel or salmon.   Lifestyle  Lose weight if you are overweight. Maintaining a healthy body mass index (BMI) can help prevent or control high cholesterol. It can also lower your risk for diabetes and high blood pressure. Ask your health care provider to help you with a diet and exercise plan to lose weight safely.  Do not use any products that contain nicotine or tobacco, such as cigarettes, e-cigarettes, and chewing tobacco. If you need help quitting, ask your health care provider. Alcohol use  Do not drink alcohol if: ? Your health care provider tells you not to drink. ? You are pregnant, may be pregnant, or are planning to become pregnant.  If you drink alcohol: ? Limit how much you use to:  0-1 drink a day for women.  0-2 drinks a day for men. ? Be aware of how much alcohol is in your drink. In the [place] one drink equals one 12 oz bottle of beer (355 mL), one 5 oz glass of wine (148 mL), or one 1 oz glass of hard liquor (44 mL). Activity  Get enough exercise. Do exercises as told by your health care provider.  Each week, do at least 150 minutes of exercise that takes a medium level of effort (moderate-intensity exercise). This kind of exercise: ? Makes your heart beat faster while allowing you to still be able to talk. ? Can be done in short sessions several times a day or longer sessions a few times a week. For example, on 5 days each week, you could walk fast or ride   your bike 3 times a day for 10 minutes each time.   Medicines  Your health care provider may recommend medicines to help lower cholesterol. This may be a medicine to lower the amount of cholesterol that your liver makes. You may need medicine if: ? Diet and lifestyle changes have not lowered your cholesterol enough. ? You have high cholesterol and other risk factors for heart disease or stroke.  Take over-the-counter and prescription medicines only as told by your  health care provider. General information  Manage your risk factors for high cholesterol. Talk with your health care provider about all your risk factors and how to lower your risk.  Manage other conditions that you have, such as diabetes or high blood pressure (hypertension).  Have blood tests to check your cholesterol levels at regular points in time as told by your health care provider.  Keep all follow-up visits as told by your health care provider. This is important. Where to find more information  American Heart Association: www.heart.org  National Heart, Lung, and Blood Institute: www.nhlbi.nih.gov Summary  High cholesterol increases your risk for heart disease and stroke. By keeping your cholesterol level low, you can reduce your risk for these conditions.  High cholesterol can often be prevented with diet and lifestyle changes.  Work with your health care provider to manage your risk factors, and have your blood tested regularly. This information is not intended to replace advice given to you by your health care provider. Make sure you discuss any questions you have with your health care provider. Document Revised: 11/21/2018 Document Reviewed: 11/21/2018 Elsevier Patient Education  2021 Elsevier Inc.  

## 2020-07-08 NOTE — Assessment & Plan Note (Signed)
Chronic, ongoing.  Continue Prilosec daily and recommend trial of a slow reduction off, if symptoms return then restart.  Check Mag level today -- as recent level mildly elevated.

## 2020-07-08 NOTE — Assessment & Plan Note (Signed)
Chronic, ongoing, tolerating Atorvastatin which was started last visit.  Continue regimen and adjust as needed. Lipid panel today.

## 2020-07-09 LAB — LIPID PANEL W/O CHOL/HDL RATIO
Cholesterol, Total: 193 mg/dL (ref 100–199)
HDL: 75 mg/dL (ref >39)
LDL Chol Calc (NIH): 98 mg/dL (ref 0–99)
Triglycerides: 117 mg/dL (ref 0–149)
VLDL Cholesterol Cal: 20 mg/dL (ref 5–40)

## 2020-07-09 LAB — COMPREHENSIVE METABOLIC PANEL
ALT: 14 IU/L (ref 0–32)
AST: 18 IU/L (ref 0–40)
Albumin/Globulin Ratio: 2 (ref 1.2–2.2)
Albumin: 4.5 g/dL (ref 3.8–4.9)
Alkaline Phosphatase: 74 IU/L (ref 44–121)
BUN/Creatinine Ratio: 22 (ref 9–23)
BUN: 15 mg/dL (ref 6–24)
Bilirubin Total: 0.2 mg/dL (ref 0.0–1.2)
CO2: 24 mmol/L (ref 20–29)
Calcium: 9.3 mg/dL (ref 8.7–10.2)
Chloride: 103 mmol/L (ref 96–106)
Creatinine, Ser: 0.69 mg/dL (ref 0.57–1.00)
Globulin, Total: 2.3 g/dL (ref 1.5–4.5)
Glucose: 94 mg/dL (ref 65–99)
Potassium: 4.4 mmol/L (ref 3.5–5.2)
Sodium: 140 mmol/L (ref 134–144)
Total Protein: 6.8 g/dL (ref 6.0–8.5)
eGFR: 101 mL/min/{1.73_m2} (ref >59)

## 2020-07-09 LAB — THYROID PEROXIDASE ANTIBODY: Thyroperoxidase Ab SerPl-aCnc: 11 IU/mL (ref 0–34)

## 2020-07-09 LAB — TSH: TSH: 0.326 u[IU]/mL — ABNORMAL LOW (ref 0.450–4.500)

## 2020-07-09 LAB — T4, FREE: Free T4: 1.37 ng/dL (ref 0.82–1.77)

## 2020-07-09 LAB — MAGNESIUM: Magnesium: 2.3 mg/dL (ref 1.6–2.3)

## 2020-07-09 NOTE — Progress Notes (Signed)
Contacted via [place] afternoon Zelda, your labs have returned: - First off look at those cholesterol levels!!!!!!  With Atorvastatin your LDL has gone from 227 to now 98 and total cholesterol from 316 to now 193.  I would like to see the LDL 70 or less.  Would be good to increase Atorvastatin to 20 MG daily if you are okay with this change.  Let me know.  Great job!! - Thyroid is showing TSH more on hyperthyroid side, low end, I would recommend cutting back on Levothyroxine a little from 175 MCG to 150 MCG.  Would you be okay with this change?  Antibody is showing no Hashimoto.   - Magnesium level normal and kidney and liver function all normal.  Any questions?  Please let me know about questions above.  Have a great day!! Keep being awesome!!  Thank you for allowing me to participate in your care.  I appreciate you. Kindest regards, Shayn Madole

## 2020-07-10 ENCOUNTER — Other Ambulatory Visit: Payer: Self-pay | Admitting: Nurse Practitioner

## 2020-07-10 DIAGNOSIS — E7800 Pure hypercholesterolemia, unspecified: Secondary | ICD-10-CM

## 2020-07-10 DIAGNOSIS — E039 Hypothyroidism, unspecified: Secondary | ICD-10-CM

## 2020-08-05 ENCOUNTER — Other Ambulatory Visit: Payer: Self-pay | Admitting: Nurse Practitioner

## 2020-08-05 MED ORDER — ATORVASTATIN CALCIUM 20 MG PO TABS: 20.0000 mg | ORAL_TABLET | Freq: Every day | ORAL | 4 refills | Status: DC

## 2020-08-26 ENCOUNTER — Encounter: Payer: Self-pay | Admitting: Nurse Practitioner

## 2020-08-26 ENCOUNTER — Ambulatory Visit (INDEPENDENT_AMBULATORY_CARE_PROVIDER_SITE_OTHER): Payer: BC Managed Care – PPO | Admitting: Nurse Practitioner

## 2020-08-26 ENCOUNTER — Other Ambulatory Visit: Payer: Self-pay

## 2020-08-26 VITALS — BP 129/79 | HR 59 | Temp 98.8°F | Wt 204.6 lb

## 2020-08-26 DIAGNOSIS — E7800 Pure hypercholesterolemia, unspecified: Secondary | ICD-10-CM

## 2020-08-26 DIAGNOSIS — E039 Hypothyroidism, unspecified: Secondary | ICD-10-CM | POA: Diagnosis not present

## 2020-08-26 NOTE — Patient Instructions (Signed)

## 2020-08-26 NOTE — Assessment & Plan Note (Signed)
Chronic, ongoing.  Continue current medication dose and adjust as needed based on TSH/T4 levels. Labs today: TSH, Free T4.  Refills sent in last visit.  Return in 6 months for follow-up.

## 2020-08-26 NOTE — Assessment & Plan Note (Signed)
Chronic, ongoing, tolerating Atorvastatin at increased dose of 20 MG.  Continue regimen and adjust as needed. Lipid panel today.

## 2020-08-26 NOTE — Progress Notes (Signed)
BP 129/79   Pulse (!) 59   Temp 98.8 F (37.1 C) (Oral)   Wt 204 lb 9.6 oz (92.8 kg)   LMP  (LMP Unknown)   SpO2 96%   BMI 33.02 kg/m    Subjective:    Patient ID: Dominique Kerr, female    DOB: [birth year], 60 y.o.   MRN: [id number]  HPI: Dominique Kerr is a 60 y.o. female  Chief Complaint  Patient presents with   Hyperlipidemia   Thyroid Problem   HYPERLIPIDEMIA Continues on Atorvastatin 20 MG, last LDL 98.   Hyperlipidemia status: good compliance Satisfied with current treatment?  yes Side effects:  no Medication compliance: good compliance Past cholesterol meds: unknown Supplements: none Aspirin:  yes The 10-year ASCVD risk score Dominique Kerr., et al., 2013) is: 1.8%   Values used to calculate the score:     Age: 69 years     Sex: Female     Is Non-Hispanic African American: No     Diabetic: No     Tobacco smoker: No     Systolic Blood Pressure: 269 mmHg     Is BP treated: No     HDL Cholesterol: 75 mg/dL     Total Cholesterol: 193 mg/dL Chest pain:  no Coronary artery disease:  no Family history CAD:  no Family history early CAD:  no    HYPOTHYROIDISM Taking 150 MCG, changed on 07/08/20.  She reports tolerating change well.  Thyroid control status:stable Satisfied with current treatment? yes Medication side effects: no Medication compliance: good compliance Etiology of hypothyroidism: unknown Recent dose adjustment:no Fatigue: no Cold intolerance: no Heat intolerance: no Weight gain: no Weight loss: no Constipation: no Diarrhea/loose stools: no Palpitations: no Lower extremity edema: no Anxiety/depressed mood: no  Relevant past medical, surgical, family and social history reviewed and updated as indicated. Interim medical history since our last visit reviewed. Allergies and medications reviewed and updated.  Review of Systems  Constitutional: Negative.   Respiratory: Negative.    Cardiovascular: Negative.   Gastrointestinal:  Negative.   Musculoskeletal: Negative.   Neurological: Negative.   Psychiatric/Behavioral: Negative.     Per HPI unless specifically indicated above     Objective:    BP 129/79   Pulse (!) 59   Temp 98.8 F (37.1 C) (Oral)   Wt 204 lb 9.6 oz (92.8 kg)   LMP  (LMP Unknown)   SpO2 96%   BMI 33.02 kg/m   Wt Readings from Last 3 Encounters:  08/26/20 204 lb 9.6 oz (92.8 kg)  07/08/20 204 lb (92.5 kg)  05/22/20 203 lb 12.8 oz (92.4 kg)    Physical Exam Vitals and nursing note reviewed.  Constitutional:      General: She is awake. She is not in acute distress.    Appearance: She is well-developed. She is not ill-appearing.  HENT:     Head: Normocephalic.     Right Ear: Hearing normal.     Left Ear: Hearing normal.  Eyes:     General: Lids are normal.        Right eye: No discharge.        Left eye: No discharge.     Conjunctiva/sclera: Conjunctivae normal.     Pupils: Pupils are equal, round, and reactive to light.  Neck:     Thyroid: No thyromegaly.     Vascular: No carotid bruit.  Cardiovascular:     Rate and Rhythm: Normal rate and regular rhythm.  Heart sounds: Normal heart sounds. No murmur heard.   No gallop.  Pulmonary:     Effort: Pulmonary effort is normal. No accessory muscle usage or respiratory distress.     Breath sounds: Normal breath sounds.  Abdominal:     General: Bowel sounds are normal.     Palpations: Abdomen is soft.  Musculoskeletal:     Cervical back: Normal range of motion and neck supple.     Right lower leg: No edema.     Left lower leg: No edema.  Lymphadenopathy:     Cervical: No cervical adenopathy.  Skin:    General: Skin is warm and dry.  Neurological:     Mental Status: She is alert and oriented to person, place, and time.  Psychiatric:        Attention and Perception: Attention normal.        Mood and Affect: Mood normal.        Speech: Speech normal.        Behavior: Behavior normal. Behavior is cooperative.         Thought Content: Thought content normal.        Judgment: Judgment normal.    Results for orders placed or performed in visit on 07/08/20  T4, free  Result Value Ref Range   Free T4 1.37 0.82 - 1.77 ng/dL  TSH  Result Value Ref Range   TSH 0.326 (L) 0.450 - 4.500 uIU/mL  Thyroid peroxidase antibody  Result Value Ref Range   Thyroperoxidase Ab SerPl-aCnc 11 0 - 34 IU/mL  Lipid Panel w/o Chol/HDL Ratio  Result Value Ref Range   Cholesterol, Total 193 100 - 199 mg/dL   Triglycerides 117 0 - 149 mg/dL   HDL 75 >39 mg/dL   VLDL Cholesterol Cal 20 5 - 40 mg/dL   LDL Chol Calc (NIH) 98 0 - 99 mg/dL  Magnesium  Result Value Ref Range   Magnesium 2.3 1.6 - 2.3 mg/dL  Comprehensive metabolic panel  Result Value Ref Range   Glucose 94 65 - 99 mg/dL   BUN 15 6 - 24 mg/dL   Creatinine, Ser 0.69 0.57 - 1.00 mg/dL   eGFR 101 >59 mL/min/1.73   BUN/Creatinine Ratio 22 9 - 23   Sodium 140 134 - 144 mmol/L   Potassium 4.4 3.5 - 5.2 mmol/L   Chloride 103 96 - 106 mmol/L   CO2 24 20 - 29 mmol/L   Calcium 9.3 8.7 - 10.2 mg/dL   Total Protein 6.8 6.0 - 8.5 g/dL   Albumin 4.5 3.8 - 4.9 g/dL   Globulin, Total 2.3 1.5 - 4.5 g/dL   Albumin/Globulin Ratio 2.0 1.2 - 2.2   Bilirubin Total 0.2 0.0 - 1.2 mg/dL   Alkaline Phosphatase 74 44 - 121 IU/L   AST 18 0 - 40 IU/L   ALT 14 0 - 32 IU/L      Assessment & Plan:   Problem List Items Addressed This Visit       Endocrine   Adult hypothyroidism - Primary    Chronic, ongoing.  Continue current medication dose and adjust as needed based on TSH/T4 levels. Labs today: TSH, Free T4.  Refills sent in last visit.  Return in 6 months for follow-up.       Relevant Orders   TSH   T4, free     Other   HLD (hyperlipidemia)    Chronic, ongoing, tolerating Atorvastatin at increased dose of 20 MG.  Continue regimen and adjust  as needed. Lipid panel today.       Relevant Orders   Lipid Panel w/o Chol/HDL Ratio     Follow up plan: Return in  about 6 months (around 02/26/2021) for Hypothyroid, Lipid.

## 2020-08-27 LAB — TSH: TSH: 1.17 u[IU]/mL (ref 0.450–4.500)

## 2020-08-27 LAB — LIPID PANEL W/O CHOL/HDL RATIO
Cholesterol, Total: 211 mg/dL — ABNORMAL HIGH (ref 100–199)
HDL: 80 mg/dL (ref >39)
LDL Chol Calc (NIH): 108 mg/dL — ABNORMAL HIGH (ref 0–99)
Triglycerides: 137 mg/dL (ref 0–149)
VLDL Cholesterol Cal: 23 mg/dL (ref 5–40)

## 2020-08-27 LAB — T4, FREE: Free T4: 1.21 ng/dL (ref 0.82–1.77)

## 2020-08-27 NOTE — Progress Notes (Signed)
Contacted via MyChart   Good evening Elener, your labs have returned.  Thyroid labs are now normal, continue current Levothyroxine dosing.  Cholesterol levels continue to show LDL above goal at 108.  Were you fasting, I believe you said you were? Are you taking the Atorvastatin 20 MG daily?  If the answer to these are yes then I recommend we increase Atorvastatin to 40 MG and recheck labs next visit.  Please let me know.  Thank you. Keep being awesome!!  Thank you for allowing me to participate in your care.  I appreciate you. Kindest regards, Vidyuth Belsito

## 2020-09-01 ENCOUNTER — Other Ambulatory Visit: Payer: Self-pay | Admitting: Nurse Practitioner

## 2020-09-01 MED ORDER — ATORVASTATIN CALCIUM 40 MG PO TABS: 40.0000 mg | ORAL_TABLET | Freq: Every day | ORAL | 4 refills | Status: DC

## 2020-09-09 ENCOUNTER — Institutional Professional Consult (permissible substitution): Payer: BC Managed Care – PPO | Admitting: Neurology

## 2020-10-06 ENCOUNTER — Ambulatory Visit (INDEPENDENT_AMBULATORY_CARE_PROVIDER_SITE_OTHER): Payer: BC Managed Care – PPO | Admitting: Podiatry

## 2020-10-06 ENCOUNTER — Ambulatory Visit (INDEPENDENT_AMBULATORY_CARE_PROVIDER_SITE_OTHER): Payer: BC Managed Care – PPO

## 2020-10-06 ENCOUNTER — Other Ambulatory Visit: Payer: Self-pay

## 2020-10-06 ENCOUNTER — Encounter: Payer: Self-pay | Admitting: Podiatry

## 2020-10-06 DIAGNOSIS — M778 Other enthesopathies, not elsewhere classified: Secondary | ICD-10-CM

## 2020-10-06 DIAGNOSIS — M722 Plantar fascial fibromatosis: Secondary | ICD-10-CM | POA: Diagnosis not present

## 2020-10-06 DIAGNOSIS — M76822 Posterior tibial tendinitis, left leg: Secondary | ICD-10-CM

## 2020-10-06 MED ORDER — METHYLPREDNISOLONE 4 MG PO TBPK: ORAL_TABLET | ORAL | 0 refills | Status: DC

## 2020-10-06 MED ORDER — MELOXICAM 15 MG PO TABS: 15.0000 mg | ORAL_TABLET | Freq: Every day | ORAL | 3 refills | Status: DC

## 2020-10-06 MED ORDER — TRIAMCINOLONE ACETONIDE 40 MG/ML IJ SUSP
20.0000 mg | Freq: Once | INTRAMUSCULAR | Status: AC
  Administered 2020-10-06: 20 mg

## 2020-10-06 NOTE — Progress Notes (Signed)
She presents today chief complaint of a painful second metatarsophalangeal joint.  She is also complaining of some pain dorsal lateral aspect of the foot medial aspect of the ankle and a little in her left heel.  Objective: Vital signs are stable alert oriented x3.  Pulses are palpable.  Pain on palpation and end range of motion psych metatarsophalangeal joint left.  Radiographs taken today demonstrate osseously mature individual no significant acute findings.  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  Plan: This point injected around the second metatarsophalangeal joint started her on Medrol Dosepak to be followed by meloxicam.  And dispensed a plantar fascial brace.  Follow-up with her in a couple weeks.

## 2020-10-09 ENCOUNTER — Encounter: Payer: Self-pay | Admitting: Neurology

## 2020-10-09 ENCOUNTER — Ambulatory Visit (INDEPENDENT_AMBULATORY_CARE_PROVIDER_SITE_OTHER): Payer: BC Managed Care – PPO | Admitting: Neurology

## 2020-10-09 VITALS — BP 126/83 | HR 64 | Ht 66.0 in | Wt 208.0 lb

## 2020-10-09 DIAGNOSIS — E669 Obesity, unspecified: Secondary | ICD-10-CM | POA: Diagnosis not present

## 2020-10-09 DIAGNOSIS — G4733 Obstructive sleep apnea (adult) (pediatric): Secondary | ICD-10-CM

## 2020-10-09 DIAGNOSIS — R351 Nocturia: Secondary | ICD-10-CM

## 2020-10-09 DIAGNOSIS — Z820 Family history of epilepsy and other diseases of the nervous system: Secondary | ICD-10-CM

## 2020-10-09 DIAGNOSIS — R519 Headache, unspecified: Secondary | ICD-10-CM

## 2020-10-09 DIAGNOSIS — E66811 Obesity, class 1: Secondary | ICD-10-CM

## 2020-10-09 DIAGNOSIS — G4719 Other hypersomnia: Secondary | ICD-10-CM

## 2020-10-09 DIAGNOSIS — R0681 Apnea, not elsewhere classified: Secondary | ICD-10-CM

## 2020-10-09 NOTE — Progress Notes (Signed)
Subjective:    Patient ID: Leilahni Rexford is a 60 y.o. female.  HPI    Star Age, MD, PhD [facility] [address], [address].O. [place], [place] [address]  Dear Henrine Screws,   I saw your patient, Brilyn Phon, upon your kind request in my sleep clinic today for initial consultation of her sleep disorder, in particular, evaluation of her prior diagnosis of obstructive sleep apnea.  The patient is unaccompanied today.  As you know, Ms. Urzua is a 60 year old right-handed woman with an underlying medical history of reflux disease, hypertension, hyperlipidemia, prediabetes, thyroid disease, arthritis, anemia, allergies, anxiety, and obesity with status post gastric sleeve, who was previously diagnosed with obstructive sleep apnea and placed on CPAP therapy.  I reviewed your office note from 07/08/2020.  She has not used her CPAP machine for the past 5 and half or 6 years.  Prior sleep study results were not available for my review today.  Sleep study testing was in [place].  She estimates that her study was in 2012 or 13.  She had weight loss surgery in 2013 or 2014.  She lost altogether about 50 pounds and gained a little bit of weight back over time.  She benefited from CPAP therapy but had some discomfort with her mask.  She was not able to get supplies in a timely manner.  She used her machine altogether for maybe a year.  Compliance download is not available, since she has not used her machine in years.  Her Epworth sleepiness score is 10 out of 24.  She reports snoring and witnessed apneas, she has been noted to have pauses in her breathing by her best friend.  Her friend also uses a CPAP machine.  Patient's father had sleep apnea and had an older style machine.  Patient is single and lives with her sister.  She has no kids, they have 1 dog in the household.  She does have a TV in her bedroom and has been on at night but turns it off before falling asleep  typically.  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  She is in bed typically between 8:30 PM and 9 PM and rise time is typically around 5 AM.  She has occasionally woken up with a headache.  She endorses daytime tiredness.  She has nocturia about once or twice per average night.  She quit smoking some 20 years ago.  She drinks alcohol occasionally, she drinks caffeine in the form of coffee, typically 2 cups in the mornings.   Her Past Medical History Is Significant For: Past Medical History:  Diagnosis Date   Allergy    Anemia    YEARS AGO WHILE HAVING PERIODS   Anxiety    Arthritis    Dysplastic nevus 06/15/2006   epigastric area - moderate   GERD (gastroesophageal reflux disease)    HSV (herpes simplex virus) infection    Hyperlipidemia    Hypertension    H/O LOST WEIGHT AND NO MEDS X 6 YRS    Obesity    PMDD (premenstrual dysphoric disorder)    Pre-diabetes    PRIOR TO GASTRIC SLEEVE   Sleep apnea    DOES NOT USE CPAP   Sleep apnea    Thyroid disease     Her Past Surgical History Is Significant For: Past Surgical History:  Procedure Laterality Date   BLADDER SURGERY     CHOLECYSTECTOMY  KNEE ARTHROSCOPY Left    KNEE ARTHROSCOPY Left 04/09/2020   Procedure: ARTHROSCOPY KNEE;  Surgeon: Dereck Leep, MD;  Location: [facility] ORS;  Service: Orthopedics;  Laterality: Left;   LAPAROSCOPIC GASTRIC SLEEVE RESECTION     parotid gland removal      Her Family History Is Significant For: Family History  Problem Relation Age of Onset   Diabetes Mother    Hepatitis C Mother    Sleep apnea Father    Arthritis Sister    Diabetes Maternal Grandmother     Her Social History Is Significant For: Social History   Socioeconomic History   Marital status: Single    Spouse name: Not on file   Number of children: Not on file   Years of education: Not on file   Highest education level: Not on file  Occupational History   Not on file  Tobacco Use    Smoking status: Former    Packs/day: 0.25    Years: 3.00    Pack years: 0.75    Types: Cigarettes    Quit date: 03/31/2000    Years since quitting: 20.5   Smokeless tobacco: Never  Vaping Use   Vaping Use: Never used  Substance and Sexual Activity   Alcohol use: Yes    Alcohol/week: 0.0 standard drinks    Comment: RARELY   Drug use: No   Sexual activity: Never  Other Topics Concern   Not on file  Social History Narrative   Not on file   Social Determinants of Health   Financial Resource Strain: Not on file  Food Insecurity: Not on file  Transportation Needs: Not on file  Physical Activity: Not on file  Stress: Not on file  Social Connections: Not on file    Her Allergies Are:  Allergies  Allergen Reactions   Lisinopril Cough  :   Her Current Medications Are:  Outpatient Encounter Medications as of 10/09/2020  Medication Sig   aspirin EC 81 MG tablet Take 81 mg by mouth daily.    atorvastatin (LIPITOR) 40 MG tablet Take 1 tablet (40 mg total) by mouth daily.   Cholecalciferol (VITAMIN D) 50 MCG (2000 UT) tablet Take 2,000 Units by mouth daily.   diphenhydramine-acetaminophen (TYLENOL PM) 25-500 MG TABS tablet Take 2 tablets by mouth at bedtime as needed (sleep).   estradiol (CLIMARA - DOSED IN MG/24 HR) 0.05 mg/24hr patch Place 0.05 mg onto the skin every Friday.   levothyroxine (SYNTHROID) 150 MCG tablet Take 150 mcg by mouth daily.   loratadine (CLARITIN) 10 MG tablet Take 10 mg by mouth every morning.   meloxicam (MOBIC) 15 MG tablet Take 1 tablet (15 mg total) by mouth daily.   methylPREDNISolone (MEDROL DOSEPAK) 4 MG TBPK tablet 6 day dose pack - take as directed   Multiple Vitamin (MULTIVITAMIN WITH MINERALS) TABS tablet Take 1 tablet by mouth daily.   omeprazole (PRILOSEC) 20 MG capsule Take 1 capsule (20 mg total) by mouth daily.   progesterone (PROMETRIUM) 200 MG capsule Take 200 mg by mouth at bedtime.   progesterone (PROMETRIUM) 200 MG capsule Take 200 mg  by mouth daily.   sertraline (ZOLOFT) 100 MG tablet Take 1 tablet (100 mg total) by mouth daily.   No facility-administered encounter medications on file as of 10/09/2020.  :   Review of Systems:  Out of a complete 14 point review of systems, all are reviewed and negative with the exception of these symptoms as listed below:  Review of Systems  Neurological:        Pt is for a sleep consult. Pt stopped using CPAP for six years. Pt stated that her friend witness that she snores and stop breathing throughout the night. Pt states she had a sleep study done in [place].   Epworth Sleepiness Scale 0= would never doze 1= slight chance of dozing 2= moderate chance of dozing 3= high chance of dozing  Sitting and reading:2 Watching TV:1 Sitting inactive in a public place (ex. Theater or meeting):1 As a passenger in a car for an hour without a break:2 Lying down to rest in the afternoon:2 Sitting and talking to someone:1 Sitting quietly after lunch (no alcohol):1 In a car, while stopped in traffic:0 Total: 10   Objective:  Neurological Exam  Physical Exam Physical Examination:   Vitals:   10/09/20 0905  BP: 126/83  Pulse: 64    General Examination: The patient is a very pleasant 60 y.o. female in no acute distress. She appears well-developed and well-nourished and well groomed.   HEENT: Normocephalic, atraumatic, pupils are equal, round and reactive to light, extraocular tracking is good without limitation to gaze excursion or nystagmus noted. Hearing is grossly intact. Face is symmetric with normal facial animation. Speech is clear with no dysarthria noted. There is no hypophonia. There is no lip, neck/head, jaw or voice tremor. Neck is supple with full range of passive and active motion. There are no carotid bruits on auscultation. Oropharynx exam reveals: mild mouth dryness, adequate dental hygiene and mild airway crowding, due to redundant soft palate and elongated uvula.   Mallampati class II.  Tonsils on the smaller side bilaterally, about 1+.  Neck circumference of 14-1/4 inches.  She has a mild overbite.  Tongue protrudes centrally and palate elevates symmetrically.  Chest: Clear to auscultation without wheezing, rhonchi or crackles noted.  Heart: S1+S2+0, regular and normal without murmurs, rubs or gallops noted.   Abdomen: Soft, non-tender and non-distended with normal bowel sounds appreciated on auscultation.  Extremities: There is no pitting edema in the distal lower extremities bilaterally.   Skin: Warm and dry without trophic changes noted.   Musculoskeletal: exam reveals no obvious joint deformities, tenderness or joint swelling or erythema.   Neurologically:  Mental status: The patient is awake, alert and oriented in all 4 spheres. Her immediate and remote memory, attention, language skills and fund of knowledge are appropriate. There is no evidence of aphasia, agnosia, apraxia or anomia. Speech is clear with normal prosody and enunciation. Thought process is linear. Mood is normal and affect is normal.  Cranial nerves II - XII are as described above under HEENT exam.  Motor exam: Normal bulk, strength and tone is noted. There is no tremor. Fine motor skills and coordination: grossly intact.  Cerebellar testing: No dysmetria or intention tremor. There is no truncal or gait ataxia.  Sensory exam: intact to light touch in the upper and lower extremities.  Gait, station and balance: She stands easily. No veering to one side is noted. No leaning to one side is noted. Posture is age-appropriate and stance is narrow based. Gait shows normal stride length and normal pace. No problems turning are noted.  Assessment and Plan:  In summary, Malayla Benedum is a very pleasant 60 y.o.-year old female with an underlying medical history of reflux disease, hypertension, hyperlipidemia, prediabetes, thyroid disease, arthritis, anemia, allergies, anxiety, and  obesity with status post gastric sleeve, who presents for evaluation of her obstructive sleep apnea.  As she recalls,  she was diagnosed with severe obstructive sleep apnea before she had her weight loss surgery several years ago.  She stopped using her CPAP machine some 5-1/2 or 6 years ago.  She would be willing to get reevaluated as she does have symptoms including witnessed apneas, morning headaches, nocturia and daytime somnolence.  She also endorses a family history of sleep apnea.  We talked about OSA, its prognosis and treatment options.  We talked about alternative treatment options to positive airway pressure treatment including a dental device, surgical options, ongoing weight loss, all depending on the severity of her underlying sleep disordered breathing.  We will proceed with testing and call her with the results.  We will plan a follow-up accordingly.  We will pick up our discussion after test results are in.  We talked about the importance of healthy lifestyle and ongoing weight management.   I answered all her questions today and the patient was in agreement. I plan to see her back after the sleep study is completed and encouraged her to call with any interim questions, concerns, problems or updates.   Thank you very much for allowing me to participate in the care of this nice patient. If I can be of any further assistance to you please do not hesitate to call me at [phone].  Sincerely,   Star Age, MD, PhD

## 2020-10-09 NOTE — Patient Instructions (Addendum)
Thank you for choosing [facility] for your sleep related care!  It was nice to meet you today! I appreciate that you entrust me with your sleep related healthcare concerns. I hope, I was able to address at least some of your concerns today, and that I can help you feel reassured and also get better.    Here is what we discussed today and what we came up with as our plan for you:    Based on your symptoms and your exam I believe you are still at risk for obstructive sleep apnea and would benefit from reevaluation as it has been many years and you likely had changes to your sleep apnea as you have lost weight since her weight loss surgery.  You may also qualify for new machine. I think we should proceed with a sleep study to determine how severe your sleep apnea is. If you have more than mild OSA, I want you to consider ongoing treatment with CPAP. Please remember, the risks and ramifications of moderate to severe obstructive sleep apnea or OSA are: Cardiovascular disease, including congestive heart failure, stroke, difficult to control hypertension, arrhythmias, and even type 2 diabetes has been linked to untreated OSA. Sleep apnea causes disruption of sleep and sleep deprivation in most cases, which, in turn, can cause recurrent headaches, problems with memory, mood, concentration, focus, and vigilance. Most people with untreated sleep apnea report excessive daytime sleepiness, which can affect their ability to drive. Please do not drive if you feel sleepy.   I will likely see you back after your sleep study to go over the test results and where to go from there. We will call you after your sleep study to advise about the results (most likely, you will hear from one of our nurses) and to set up an appointment at the time, as necessary.    Our sleep lab administrative assistant will call you to schedule your sleep study. If you don't hear back from her by about 2 weeks from now, please  feel free to call her at [phone]. You can leave a message with your phone number and concerns, if you get the voicemail box. She will call back as soon as possible.

## 2020-10-14 ENCOUNTER — Ambulatory Visit: Payer: BC Managed Care – PPO | Admitting: Dermatology

## 2020-10-22 ENCOUNTER — Other Ambulatory Visit: Payer: Self-pay

## 2020-10-22 ENCOUNTER — Ambulatory Visit (INDEPENDENT_AMBULATORY_CARE_PROVIDER_SITE_OTHER): Payer: BC Managed Care – PPO | Admitting: Dermatology

## 2020-10-22 DIAGNOSIS — L304 Erythema intertrigo: Secondary | ICD-10-CM

## 2020-10-22 DIAGNOSIS — L719 Rosacea, unspecified: Secondary | ICD-10-CM | POA: Diagnosis not present

## 2020-10-22 DIAGNOSIS — Z1283 Encounter for screening for malignant neoplasm of skin: Secondary | ICD-10-CM

## 2020-10-22 DIAGNOSIS — L821 Other seborrheic keratosis: Secondary | ICD-10-CM

## 2020-10-22 DIAGNOSIS — D229 Melanocytic nevi, unspecified: Secondary | ICD-10-CM

## 2020-10-22 DIAGNOSIS — L578 Other skin changes due to chronic exposure to nonionizing radiation: Secondary | ICD-10-CM

## 2020-10-22 DIAGNOSIS — L814 Other melanin hyperpigmentation: Secondary | ICD-10-CM

## 2020-10-22 DIAGNOSIS — Z86018 Personal history of other benign neoplasm: Secondary | ICD-10-CM

## 2020-10-22 DIAGNOSIS — D1800 Hemangioma unspecified site: Secondary | ICD-10-CM

## 2020-10-22 NOTE — Progress Notes (Signed)
Follow-Up Visit   Subjective  Dominique Kerr is a 60 y.o. female who presents for the following: Annual Exam (History of dysplastic nevus - TBSE today). The patient presents for Total-Body Skin Exam (TBSE) for skin cancer screening and mole check.  The following portions of the chart were reviewed this encounter and updated as appropriate:   Tobacco  Allergies  Meds  Problems  Med Hx  Surg Hx  Fam Hx     Review of Systems:  No other skin or systemic complaints except as noted in HPI or Assessment and Plan.  Objective  Well appearing patient in no apparent distress; mood and affect are within normal limits.  A full examination was performed including scalp, head, eyes, ears, nose, lips, neck, chest, axillae, abdomen, back, buttocks, bilateral upper extremities, bilateral lower extremities, hands, feet, fingers, toes, fingernails, and toenails. All findings within normal limits unless otherwise noted below.  Face Erythema   Inframammary areas Pinkness   Assessment & Plan   History of Dysplastic Nevi - No evidence of recurrence today - Recommend regular full body skin exams - Recommend daily broad spectrum sunscreen SPF 30+ to sun-exposed areas, reapply every 2 hours as needed.  - Call if any new or changing lesions are noted between office visits  Lentigines - Scattered tan macules - Due to sun exposure - Benign-appering, observe - Recommend daily broad spectrum sunscreen SPF 30+ to sun-exposed areas, reapply every 2 hours as needed. - Call for any changes  Seborrheic Keratoses - Stuck-on, waxy, tan-brown papules and/or plaques  - Benign-appearing - Discussed benign etiology and prognosis. - Observe - Call for any changes  Melanocytic Nevi - Tan-brown and/or pink-flesh-colored symmetric macules and papules - Benign appearing on exam today - Observation - Call clinic for new or changing moles - Recommend daily use of broad spectrum spf 30+ sunscreen to  sun-exposed areas.   Hemangiomas - Red papules - Discussed benign nature - Observe - Call for any changes  Actinic Damage - Chronic condition, secondary to cumulative UV/sun exposure - diffuse scaly erythematous macules with underlying dyspigmentation - Recommend daily broad spectrum sunscreen SPF 30+ to sun-exposed areas, reapply every 2 hours as needed.  - Staying in the shade or wearing long sleeves, sun glasses (UVA+UVB protection) and wide brim hats (4-inch brim around the entire circumference of the hat) are also recommended for sun protection.  - Call for new or changing lesions.  Skin cancer screening performed today.  Rosacea Face Rosacea is a chronic progressive skin condition usually affecting the face of adults, causing redness and/or acne bumps. It is treatable but not curable. It sometimes affects the eyes (ocular rosacea) as well. It may respond to topical and/or systemic medication and can flare with stress, sun exposure, alcohol, exercise and some foods.  Daily application of broad spectrum spf 30+ sunscreen to face is recommended to reduce flares.  Discussed BBL/laser treatments as well as other topical and oral treatment options.  Patient declines at this time.  Erythema intertrigo Inframammary areas Patient declines treatment today. May consider intertrigo cream from [facility] in the future if condition worsens.  Intertrigo is a chronic recurrent rash that occurs in skin fold areas that may be associated with friction; heat; moisture; yeast; fungus; and bacteria.  It is exacerbated by increased movement / activity; sweating; and higher atmospheric temperature.  Return in about 1 year (around 10/22/2021) for TBSE.  I, Ashok Cordia, CMA, am acting as scribe for Sarina Ser, MD .  Documentation: I have reviewed the above documentation for accuracy and completeness, and I agree with the above.  Sarina Ser, MD

## 2020-10-22 NOTE — Patient Instructions (Signed)

## 2020-10-23 ENCOUNTER — Encounter: Payer: Self-pay | Admitting: Dermatology

## 2020-11-05 ENCOUNTER — Ambulatory Visit: Payer: BC Managed Care – PPO | Admitting: Podiatry

## 2020-11-19 ENCOUNTER — Ambulatory Visit: Payer: BC Managed Care – PPO | Admitting: Nurse Practitioner

## 2020-11-19 ENCOUNTER — Ambulatory Visit (INDEPENDENT_AMBULATORY_CARE_PROVIDER_SITE_OTHER): Payer: BC Managed Care – PPO | Admitting: Neurology

## 2020-11-19 DIAGNOSIS — G4733 Obstructive sleep apnea (adult) (pediatric): Secondary | ICD-10-CM

## 2020-11-19 DIAGNOSIS — G4719 Other hypersomnia: Secondary | ICD-10-CM

## 2020-11-19 DIAGNOSIS — E66811 Obesity, class 1: Secondary | ICD-10-CM

## 2020-11-19 DIAGNOSIS — Z820 Family history of epilepsy and other diseases of the nervous system: Secondary | ICD-10-CM

## 2020-11-19 DIAGNOSIS — G4734 Idiopathic sleep related nonobstructive alveolar hypoventilation: Secondary | ICD-10-CM

## 2020-11-19 DIAGNOSIS — R0681 Apnea, not elsewhere classified: Secondary | ICD-10-CM

## 2020-11-19 DIAGNOSIS — R351 Nocturia: Secondary | ICD-10-CM

## 2020-11-19 DIAGNOSIS — R519 Headache, unspecified: Secondary | ICD-10-CM

## 2020-11-19 DIAGNOSIS — E669 Obesity, unspecified: Secondary | ICD-10-CM

## 2020-12-01 NOTE — Progress Notes (Signed)
See procedure note.

## 2020-12-01 NOTE — Procedures (Signed)
     Dominique Kerr  HOME SLEEP TEST (Watch PAT) REPORT  STUDY DATE: 11/19/20  DOB: 24-Nov-1960  MRN: [id number]  ORDERING CLINICIAN: Star Age, MD, PhD   REFERRING CLINICIAN: Venita Lick, NP   CLINICAL INFORMATION/HISTORY: 60 year old right-handed woman with an underlying medical history of reflux disease, hypertension, hyperlipidemia, prediabetes, thyroid disease, arthritis, anemia, allergies, anxiety, and obesity with status post gastric sleeve, who was previously diagnosed with obstructive sleep apnea and placed on CPAP therapy. She has not used her CPAP machine for nearly 6 years.   Epworth sleepiness score: 10/24.  BMI: 33.3 kg/m  FINDINGS:   Sleep Summary:   Total Recording Time (hours, min): 9 hours, 51 min  Total Sleep Time (hours, min):  8 hours, 59 min   Percent REM (%):    20%   Respiratory Indices:   Calculated pAHI (per hour):  63.3/hour         REM pAHI:    54.9/hour       NREM pAHI: 65.4/hour  Oxygen Saturation Statistics:    Oxygen Saturation (%) Mean: 92%   Minimum oxygen saturation (%):                 61%   O2 Saturation Range (%): 61 - 99%    O2 Saturation (minutes) <=88%: 57.6 min  Pulse Rate Statistics:   Pulse Mean (bpm):    66/min    Pulse Range (48 - 96/min)   IMPRESSION: 1. OSA (obstructive sleep apnea), severe 2. Nocturnal hypoxemia  RECOMMENDATION:  This home sleep test demonstrates severe obstructive sleep apnea with a total AHI of 63.3/hour and O2 nadir of 61% with significant time below or at 88% saturation of nearly 1 hour, indicating nocturnal hypoxemia. Mild to moderate snoring was recommended. Treatment with positive airway pressure is highly recommended. This will require - ideally - a full night CPAP titration study for proper treatment settings, O2 monitoring and mask fitting. For now, the patient will be advised to proceed with an autoPAP titration/trial at home.  A laboratory attended titration  study can be considered in the future for optimization of his treatment and better tolerance of therapy.  Alternative treatment options are limited secondary to the severity of the patient's sleep disordered breathing.  Please note, that untreated obstructive sleep apnea may carry additional perioperative morbidity. Patients with significant obstructive sleep apnea should receive perioperative PAP therapy and the surgeons and particularly the anesthesiologist should be informed of the diagnosis and the severity of the sleep disordered breathing. The patient should be cautioned not to drive, work at heights, or operate dangerous or heavy equipment when tired or sleepy. Review and reiteration of good sleep hygiene measures should be pursued with any patient. Other causes of the patient's symptoms, including circadian rhythm disturbances, an underlying mood disorder, medication effect and/or an underlying medical problem cannot be ruled out based on this test. Clinical correlation is recommended. The patient and her referring provider will be notified of the test results. The patient will be seen in follow up in sleep clinic at [facility].  I certify that I have reviewed the raw data recording prior to the issuance of this report in accordance with the standards of the American Academy of Sleep Medicine (AASM).  INTERPRETING PHYSICIAN:   Star Age, MD, PhD  Board Certified in Neurology and Sleep Medicine  [facility] [address] [place], [place] [address] [phone]

## 2020-12-01 NOTE — Addendum Note (Signed)
Addended by: Star Age on: 12/01/2020 05:27 PM   Modules accepted: Orders

## 2020-12-03 ENCOUNTER — Telehealth: Payer: Self-pay | Admitting: *Deleted

## 2020-12-03 ENCOUNTER — Encounter: Payer: Self-pay | Admitting: *Deleted

## 2020-12-03 NOTE — Telephone Encounter (Signed)
I called pt. I advised pt that Dr. Rexene Alberts reviewed their sleep study results and found that pt has severe OSA with decreased oxygen desaturations. Dr. Rexene Alberts recommends that pt start autopap. I reviewed PAP compliance expectations with the pt. Pt is agreeable to starting an auto-PAP. I advised pt that an order will be sent to a DME, [facility], and they will call the pt within about one week after they file with the pt's insurance. [facility] will show the pt how to use the machine, fit for masks, and troubleshoot the auto-PAP if needed. A follow up appt was made for insurance purposes with Dr. Rexene Alberts or NP 31-89 days once she gets a machine. Pt verbalized understanding to arrive 15 minutes early and bring their auto-PAP. A letter with all of this information in it will be mailed to the pt as a reminder. I verified with the pt that the address we have on file is correct. Pt verbalized understanding of results. Pt had no questions at this time but was encouraged to call back if questions arise. I have sent the order to [facility] and have received confirmation that they have received the order.

## 2020-12-03 NOTE — Telephone Encounter (Signed)
Done

## 2020-12-03 NOTE — Telephone Encounter (Signed)
-----   Message from Star Age, MD sent at 12/01/2020  5:27 PM EDT ----- Patient referred by Marnee Guarneri, NP, seen by me on 10/09/20 for re-eval of her OSA, patient had a HST on 11/19/20.    Please call and notify the patient that the recent home sleep test showed obstructive sleep apnea in the severe range with significant oxygen desaturations. I strongly recommend treatment for this in the form of autoPAP, which means, that we don't have to bring her in for a sleep study with CPAP, but will let her start using a so called autoPAP machine at home, through a DME company (of her choice, or as per insurance requirement). The DME representative will fit the patient with a mask of choice, educate her on how to use the machine, how to put the mask on, etc. I have placed an order in the chart. Please expedite. Please send the order to a local DME, talk to patient, send report to referring MD. Please also reinforce the need for compliance with treatment. We will need a FU in sleep clinic for 10 weeks post-PAP set up, please arrange that with me or one of our NPs. Thanks,   Star Age, MD, PhD [facility] [place])

## 2020-12-10 ENCOUNTER — Ambulatory Visit: Payer: BC Managed Care – PPO | Admitting: Podiatry

## 2021-01-08 ENCOUNTER — Ambulatory Visit: Payer: BC Managed Care – PPO | Admitting: Nurse Practitioner

## 2021-01-23 ENCOUNTER — Other Ambulatory Visit: Payer: Self-pay | Admitting: Obstetrics and Gynecology

## 2021-01-23 DIAGNOSIS — Z1231 Encounter for screening mammogram for malignant neoplasm of breast: Secondary | ICD-10-CM

## 2021-01-23 DIAGNOSIS — Z124 Encounter for screening for malignant neoplasm of cervix: Secondary | ICD-10-CM | POA: Diagnosis not present

## 2021-01-23 DIAGNOSIS — Z01419 Encounter for gynecological examination (general) (routine) without abnormal findings: Secondary | ICD-10-CM | POA: Diagnosis not present

## 2021-01-23 DIAGNOSIS — Z23 Encounter for immunization: Secondary | ICD-10-CM | POA: Diagnosis not present

## 2021-01-26 ENCOUNTER — Ambulatory Visit: Payer: Self-pay

## 2021-01-26 ENCOUNTER — Ambulatory Visit (INDEPENDENT_AMBULATORY_CARE_PROVIDER_SITE_OTHER): Payer: BC Managed Care – PPO | Admitting: Internal Medicine

## 2021-01-26 ENCOUNTER — Encounter: Payer: Self-pay | Admitting: Internal Medicine

## 2021-01-26 ENCOUNTER — Telehealth: Payer: Self-pay | Admitting: Nurse Practitioner

## 2021-01-26 DIAGNOSIS — J0110 Acute frontal sinusitis, unspecified: Secondary | ICD-10-CM | POA: Diagnosis not present

## 2021-01-26 MED ORDER — FEXOFENADINE HCL 180 MG PO TABS: 180.0000 mg | ORAL_TABLET | Freq: Every day | ORAL | 1 refills | Status: DC

## 2021-01-26 MED ORDER — BENZONATATE 100 MG PO CAPS: 100.0000 mg | ORAL_CAPSULE | Freq: Three times a day (TID) | ORAL | 0 refills | Status: DC | PRN

## 2021-01-26 MED ORDER — AMOXICILLIN-POT CLAVULANATE 875-125 MG PO TABS: 1.0000 | ORAL_TABLET | Freq: Two times a day (BID) | ORAL | 0 refills | Status: AC

## 2021-01-26 MED ORDER — ALBUTEROL SULFATE HFA 108 (90 BASE) MCG/ACT IN AERS: 2.0000 | INHALATION_SPRAY | Freq: Four times a day (QID) | RESPIRATORY_TRACT | 1 refills | Status: DC | PRN

## 2021-01-26 NOTE — Telephone Encounter (Signed)
Disregard.  Thank you for responding, pt is scheduled to see Dr. Neomia Dear today.

## 2021-01-26 NOTE — Telephone Encounter (Signed)
Copied from [place] [phone]. Topic: Appointment Scheduling - Scheduling Inquiry for Clinic >> Jan 26, 2021  9:21 AM Scherrie Gerlach wrote: Reason for CRM: pt calling w/ body aches, low grade fever, cough. Declined to see another provider, prefers to see Jolene. Will do covid test, but would like appt with Jolene if possible for sx.

## 2021-01-26 NOTE — Progress Notes (Signed)
LMP  (LMP Unknown)    Subjective:    Patient ID: Dominique Kerr, female    DOB: 07-22-1960, 60 y.o.   MRN: [id number]  No chief complaint on file.   HPI: Dominique Kerr is a 60 y.o. female   This visit was completed via telephone due to the restrictions of the COVID-19 pandemic. All issues as above were discussed and addressed but no physical exam was performed. If it was felt that the patient should be evaluated in the office, they were directed there. The patient verbally consented to this visit. Patient was unable to complete an audio/visual visit due to Technical difficulties. Due to the catastrophic nature of the COVID-19 pandemic, this visit was done through audio contact only. Location of the patient: home Location of the provider: work Those involved with this call:  Provider: Charlynne Cousins, MD CMA: Frazier Butt, [place] Desk/Registration: [employer]  Time spent on call: 10 minutes on the phone discussing health concerns. 10 minutes total spent in review of patient's record and preparation of their chart.    Sinus Problem This is a new (home covid test was -ve.) problem. The current episode started yesterday (x 2 days worse today, coughing+ clear phlegm, sometimes has yellowish d/c from nose headache + frontal and back of neck hurts per pt.). The problem has been gradually worsening since onset. There has been no fever (has had Tmax - 99 F). The pain is moderate. Associated symptoms include chills, congestion, coughing, headaches and sinus pressure. Pertinent negatives include no diaphoresis, ear pain, hoarse voice, neck pain, shortness of breath, sneezing, sore throat or swollen glands.   No chief complaint on file.   Relevant past medical, surgical, family and social history reviewed and updated as indicated. Interim medical history since our last visit reviewed. Allergies and medications reviewed and updated.  Review of Systems  Constitutional:  Positive  for chills. Negative for diaphoresis.  HENT:  Positive for congestion and sinus pressure. Negative for ear pain, hoarse voice, sneezing and sore throat.   Respiratory:  Positive for cough. Negative for shortness of breath.   Musculoskeletal:  Negative for neck pain.  Neurological:  Positive for headaches.   Per HPI unless specifically indicated above     Objective:    LMP  (LMP Unknown)   Wt Readings from Last 3 Encounters:  10/09/20 208 lb (94.3 kg)  08/26/20 204 lb 9.6 oz (92.8 kg)  07/08/20 204 lb (92.5 kg)    Physical Exam  Unable to peform sec to virtual visit.   Results for orders placed or performed in visit on 08/26/20  TSH  Result Value Ref Range   TSH 1.170 0.450 - 4.500 uIU/mL  T4, free  Result Value Ref Range   Free T4 1.21 0.82 - 1.77 ng/dL  Lipid Panel w/o Chol/HDL Ratio  Result Value Ref Range   Cholesterol, Total 211 (H) 100 - 199 mg/dL   Triglycerides 137 0 - 149 mg/dL   HDL 80 >39 mg/dL   VLDL Cholesterol Cal 23 5 - 40 mg/dL   LDL Chol Calc (NIH) 108 (H) 0 - 99 mg/dL        Current Outpatient Medications:    albuterol (VENTOLIN HFA) 108 (90 Base) MCG/ACT inhaler, Inhale 2 puffs into the lungs every 6 (six) hours as needed for wheezing or shortness of breath., Disp: 8 g, Rfl: 1   amoxicillin-clavulanate (AUGMENTIN) 875-125 MG tablet, Take 1 tablet by mouth 2 (two) times daily for 7  days., Disp: 20 tablet, Rfl: 0   benzonatate (TESSALON) 100 MG capsule, Take 1 capsule (100 mg total) by mouth 3 (three) times daily as needed for cough., Disp: 20 capsule, Rfl: 0   fexofenadine (ALLEGRA ALLERGY) 180 MG tablet, Take 1 tablet (180 mg total) by mouth daily., Disp: 10 tablet, Rfl: 1   aspirin EC 81 MG tablet, Take 81 mg by mouth daily. , Disp: , Rfl:    atorvastatin (LIPITOR) 40 MG tablet, Take 1 tablet (40 mg total) by mouth daily., Disp: 90 tablet, Rfl: 4   Cholecalciferol (VITAMIN D) 50 MCG (2000 UT) tablet, Take 2,000 Units by mouth daily., Disp: , Rfl:     diphenhydramine-acetaminophen (TYLENOL PM) 25-500 MG TABS tablet, Take 2 tablets by mouth at bedtime as needed (sleep)., Disp: , Rfl:    estradiol (CLIMARA - DOSED IN MG/24 HR) 0.05 mg/24hr patch, Place 0.05 mg onto the skin every Friday., Disp: , Rfl:    levothyroxine (SYNTHROID) 150 MCG tablet, Take 150 mcg by mouth daily., Disp: , Rfl:    meloxicam (MOBIC) 15 MG tablet, Take 1 tablet (15 mg total) by mouth daily., Disp: 30 tablet, Rfl: 3   methylPREDNISolone (MEDROL DOSEPAK) 4 MG TBPK tablet, 6 day dose pack - take as directed, Disp: 21 tablet, Rfl: 0   Multiple Vitamin (MULTIVITAMIN WITH MINERALS) TABS tablet, Take 1 tablet by mouth daily., Disp: , Rfl:    omeprazole (PRILOSEC) 20 MG capsule, Take 1 capsule (20 mg total) by mouth daily., Disp: 90 capsule, Rfl: 4   progesterone (PROMETRIUM) 200 MG capsule, Take 200 mg by mouth at bedtime., Disp: , Rfl:    progesterone (PROMETRIUM) 200 MG capsule, Take 200 mg by mouth daily., Disp: , Rfl:    sertraline (ZOLOFT) 100 MG tablet, Take 1 tablet (100 mg total) by mouth daily., Disp: 90 tablet, Rfl: 4    Assessment & Plan:  Acute Sinusits:  Will start pt on augmentin X 7 DAYS, albuterol q 4-6 hrly prn,  Will tessalon pealrs  pt advised to take Tylenol q 4- 6 hourly as needed. pt to take allegra q pm as needed and to call office if symptoms worsened pt verbalised understanding of such.     Problem List Items Addressed This Visit   None   No orders of the defined types were placed in this encounter.    Meds ordered this encounter  Medications   amoxicillin-clavulanate (AUGMENTIN) 875-125 MG tablet    Sig: Take 1 tablet by mouth 2 (two) times daily for 7 days.    Dispense:  20 tablet    Refill:  0   albuterol (VENTOLIN HFA) 108 (90 Base) MCG/ACT inhaler    Sig: Inhale 2 puffs into the lungs every 6 (six) hours as needed for wheezing or shortness of breath.    Dispense:  8 g    Refill:  1   benzonatate (TESSALON) 100 MG capsule    Sig:  Take 1 capsule (100 mg total) by mouth 3 (three) times daily as needed for cough.    Dispense:  20 capsule    Refill:  0   fexofenadine (ALLEGRA ALLERGY) 180 MG tablet    Sig: Take 1 tablet (180 mg total) by mouth daily.    Dispense:  10 tablet    Refill:  1     Follow up plan: No follow-ups on file.

## 2021-01-26 NOTE — Telephone Encounter (Signed)
Chief Complaint: Sinus congestion Symptoms: Left cheek pain, teeth pain, cough Frequency: Pain started yesterday, congestion worse over past 2-3 days Pertinent Negatives: Patient denies COVID (tested today at home), denies fever Disposition: [] ED /[] Urgent Care (no appt availability in office / [x] Appointment(In office/virtual)/ []  [facility]/ [] Home Care    Message from Scherrie Gerlach sent at 01/26/2021  9:26 AM EST  pt calling w/ body aches, low grade fever, cough. Declined to see another provider, prefers to see Jolene. Will do covid test, but would like appt with Jolene if possible for sx.  I sent message to the office as well.  She said it doesn't matter if a nurse calls her    Reason for Disposition  Lots of coughing  Answer Assessment - Initial Assessment Questions 1. LOCATION: "Where does it hurt?"      Left cheek and teeth 2. ONSET: "When did the sinus pain start?"  (e.g., hours, days)      Yesterday. Sinus congestion worse the past couple of day 3. SEVERITY: "How bad is the pain?"   (Scale 1-10; mild, moderate or severe)   - MILD (1-3): doesn't interfere with normal activities    - MODERATE (4-7): interferes with normal activities (e.g., work or school) or awakens from sleep   - SEVERE (8-10): excruciating pain and patient unable to do any normal activities        5 4. RECURRENT SYMPTOM: "Have you ever had sinus problems before?" If Yes, ask: "When was the last time?" and "What happened that time?"      Yes 3 years ago 5. NASAL CONGESTION: "Is the nose blocked?" If Yes, ask: "Can you open it or must you breathe through your mouth?"     No 6. NASAL DISCHARGE: "Do you have discharge from your nose?" If so ask, "What color?"     Yes, clear now a little green 7. FEVER: "Do you have a fever?" If Yes, ask: "What is it, how was it measured, and when did it start?"      No, took ibuprofen 8. OTHER SYMPTOMS: "Do you have any other symptoms?" (e.g., sore throat, cough,  earache, difficulty breathing)     Cough w/chest congestion (tightness), back from coughing 9. PREGNANCY: "Is there any chance you are pregnant?" "When was your last menstrual period?"     N/A  Protocols used: Sinus Pain or Congestion-A-AH

## 2021-01-27 LAB — HM PAP SMEAR

## 2021-01-27 NOTE — Telephone Encounter (Signed)
This was in Dr. Levada Dy box- forwarding it

## 2021-01-27 NOTE — Telephone Encounter (Signed)
Pt was seen by Dr. Neomia Dear on 01/26/2021.  Note was just an fyi.  Thank you. =)

## 2021-01-29 DIAGNOSIS — M170 Bilateral primary osteoarthritis of knee: Secondary | ICD-10-CM | POA: Diagnosis not present

## 2021-01-29 DIAGNOSIS — Z9889 Other specified postprocedural states: Secondary | ICD-10-CM | POA: Diagnosis not present

## 2021-02-05 DIAGNOSIS — G4733 Obstructive sleep apnea (adult) (pediatric): Secondary | ICD-10-CM | POA: Diagnosis not present

## 2021-02-14 DIAGNOSIS — R87618 Other abnormal cytological findings on specimens from cervix uteri: Secondary | ICD-10-CM | POA: Insufficient documentation

## 2021-02-17 ENCOUNTER — Ambulatory Visit
Admission: RE | Admit: 2021-02-17 | Discharge: 2021-02-17 | Disposition: A | Discharge status: Home / Self Care | Payer: BC Managed Care – PPO | Source: Ambulatory Visit | Attending: Obstetrics and Gynecology | Admitting: Obstetrics and Gynecology

## 2021-02-17 ENCOUNTER — Other Ambulatory Visit: Payer: Self-pay

## 2021-02-17 DIAGNOSIS — Z1231 Encounter for screening mammogram for malignant neoplasm of breast: Secondary | ICD-10-CM | POA: Insufficient documentation

## 2021-02-19 ENCOUNTER — Ambulatory Visit (INDEPENDENT_AMBULATORY_CARE_PROVIDER_SITE_OTHER): Payer: BC Managed Care – PPO | Admitting: Nurse Practitioner

## 2021-02-19 ENCOUNTER — Other Ambulatory Visit: Payer: Self-pay

## 2021-02-19 ENCOUNTER — Encounter: Payer: Self-pay | Admitting: Nurse Practitioner

## 2021-02-19 VITALS — BP 126/78 | HR 67 | Temp 98.0°F | Ht 66.0 in | Wt 215.6 lb

## 2021-02-19 DIAGNOSIS — E7800 Pure hypercholesterolemia, unspecified: Secondary | ICD-10-CM

## 2021-02-19 DIAGNOSIS — E039 Hypothyroidism, unspecified: Secondary | ICD-10-CM | POA: Diagnosis not present

## 2021-02-19 DIAGNOSIS — F419 Anxiety disorder, unspecified: Secondary | ICD-10-CM | POA: Diagnosis not present

## 2021-02-19 DIAGNOSIS — F32A Depression, unspecified: Secondary | ICD-10-CM

## 2021-02-19 DIAGNOSIS — R7301 Impaired fasting glucose: Secondary | ICD-10-CM

## 2021-02-19 DIAGNOSIS — G4733 Obstructive sleep apnea (adult) (pediatric): Secondary | ICD-10-CM

## 2021-02-19 NOTE — Assessment & Plan Note (Signed)
Chronic, ongoing, tolerating Atorvastatin at 40 MG daily -- increased in July.  Continue regimen and adjust as needed. Lipid panel today.

## 2021-02-19 NOTE — Assessment & Plan Note (Signed)
Diagnosed September 2022, she has a CPAP and is using 100% of the time with improvement in symptoms.  Recommend continuing this.

## 2021-02-19 NOTE — Patient Instructions (Signed)

## 2021-02-19 NOTE — Assessment & Plan Note (Signed)
A1c 5.8% last visit, recheck today and continue diet control.

## 2021-02-19 NOTE — Assessment & Plan Note (Signed)
Chronic, stable.  Denies SI/HI.  Continue current medication regimen and adjust as needed. 

## 2021-02-19 NOTE — Progress Notes (Addendum)
BP 126/78    Pulse 67    Temp 98 F (36.7 C)    Ht 5\' 6"  (1.676 m)    Wt 215 lb 9.6 oz (97.8 kg)    LMP  (LMP Unknown)    SpO2 96%    BMI 34.80 kg/m    Subjective:    Patient ID: Dominique Kerr, female    DOB: Dec 09, 1960, 60 y.o.   MRN: [id number]  HPI: Dominique Kerr is a 60 y.o. female  Chief Complaint  Patient presents with   Hypothyroidism    Patient is here for 6 month follow up on Hypothyroidism. Patient denies having any concerns at today's visit.    Sleep Apnea    Patient states she did have her sleep study performed recently and she was diagnosed with Sleep Apnea. Patient states she is currently using a CPAP machine.    HYPOTHYROIDISM Currently taking Levothyroxine 150 MCG daily with recent labs within normal range.  Diagnosed in teen in years. Thyroid control status:stable Satisfied with current treatment? yes Medication side effects: no Medication compliance: good compliance Etiology of hypothyroidism: unknown Recent dose adjustment:no Fatigue: no Cold intolerance: no Heat intolerance: no Weight gain: no Weight loss: no Constipation: no Diarrhea/loose stools: no Palpitations: no Lower extremity edema: no Anxiety/depressed mood: no   SLEEP APNEA Diagnosed in September 2022 -- using CPAP at this time, 100% of the time.  Has improved her symptoms of fatigue. Sleep apnea status: stable Duration: months Satisfied with current treatment?:  yes CPAP use:  yes Sleep quality with CPAP use: excellent Treament compliance:excellent compliance Last sleep study: 11/19/20 Treatments attempted: CPAP Wakes feeling refreshed:  yes Daytime hypersomnolence:  no Fatigue:  no Insomnia:   occasional Good sleep hygiene:  yes Difficulty falling asleep:  no Difficulty staying asleep:  no Snoring bothers bed partner:  no Observed apnea by bed partner: no Obesity:  yes Hypertension: no  Pulmonary hypertension:  no Coronary artery disease:  no    HYPERLIPIDEMIA Continues on Atorvastatin 40 MG -- increased in July to this dose.  A1c 5.8% = in March. Hyperlipidemia status: good compliance Satisfied with current treatment?  yes Side effects:  no Medication compliance: good compliance Past cholesterol meds: Atorvastatin Supplements: none Aspirin:  yes The 10-year ASCVD risk score (Arnett DK, et al., 2019) is: 2%   Values used to calculate the score:     Age: 89 years     Sex: Female     Is Non-Hispanic African American: No     Diabetic: No     Tobacco smoker: No     Systolic Blood Pressure: 673 mmHg     Is BP treated: No     HDL Cholesterol: 80 mg/dL     Total Cholesterol: 211 mg/dL Chest pain:  no Coronary artery disease:  no Family history CAD:  no Family history early CAD:  no  DEPRESSION Continues on Sertraline 100 MG daily. Mood status: controlled Satisfied with current treatment?: yes Symptom severity: moderate  Duration of current treatment : chronic Side effects: no Medication compliance: good compliance Depressed mood: no Anxious mood: no Anhedonia: no Significant weight loss or gain: no Insomnia: occasional Fatigue: no Feelings of worthlessness or guilt: no Impaired concentration/indecisiveness: no Suicidal ideations: no Hopelessness: no Crying spells: no Depression screen [facility] 2/9 02/19/2021 05/22/2020 11/12/2019 05/08/2019 11/03/2018  Decreased Interest 0 0 0 0 0  Down, Depressed, Hopeless 0 0 1 1 0  PHQ - 2 Score 0 0 1 1 0  Altered sleeping 0 0 0 0 0  Tired, decreased energy 0 0 1 0 0  Change in appetite 0 0 0 1 0  Feeling bad or failure about yourself  0 0 0 0 0  Trouble concentrating 0 0 0 0 0  Moving slowly or fidgety/restless 0 0 0 0 0  Suicidal thoughts 0 0 0 0 0  PHQ-9 Score 0 0 2 2 0  Difficult doing work/chores Not difficult at all - Not difficult at all Not difficult at all Not difficult at all  Some recent data might be hidden    GAD 7 : Generalized Anxiety Score 02/19/2021  11/12/2019 05/08/2019 05/03/2018  Nervous, Anxious, on Edge 1 1 1 1   Control/stop worrying 0 1 1 0  Worry too much - different things 0 1 0 1  Trouble relaxing 0 0 0 0  Restless 0 0 0 0  Easily annoyed or irritable 0 1 1 1   Afraid - awful might happen 0 0 0 0  Total GAD 7 Score 1 4 3 3   Anxiety Difficulty Not difficult at all Not difficult at all Not difficult at all Not difficult at all    Relevant past medical, surgical, family and social history reviewed and updated as indicated. Interim medical history since our last visit reviewed. Allergies and medications reviewed and updated.  Review of Systems  Constitutional: Negative.   Respiratory: Negative.    Cardiovascular: Negative.   Gastrointestinal: Negative.   Neurological: Negative.   Psychiatric/Behavioral: Negative.     Per HPI unless specifically indicated above     Objective:    BP 126/78    Pulse 67    Temp 98 F (36.7 C)    Ht 5\' 6"  (1.676 m)    Wt 215 lb 9.6 oz (97.8 kg)    LMP  (LMP Unknown)    SpO2 96%    BMI 34.80 kg/m   Wt Readings from Last 3 Encounters:  02/19/21 215 lb 9.6 oz (97.8 kg)  10/09/20 208 lb (94.3 kg)  08/26/20 204 lb 9.6 oz (92.8 kg)    Physical Exam Vitals and nursing note reviewed.  Constitutional:      General: She is awake. She is not in acute distress.    Appearance: She is well-developed. She is not ill-appearing.  HENT:     Head: Normocephalic.     Right Ear: Hearing normal.     Left Ear: Hearing normal.  Eyes:     General: Lids are normal.        Right eye: No discharge.        Left eye: No discharge.     Conjunctiva/sclera: Conjunctivae normal.     Pupils: Pupils are equal, round, and reactive to light.  Neck:     Thyroid: No thyromegaly.     Vascular: No carotid bruit.  Cardiovascular:     Rate and Rhythm: Normal rate and regular rhythm.     Heart sounds: Normal heart sounds. No murmur heard.   No gallop.  Pulmonary:     Effort: Pulmonary effort is normal. No accessory  muscle usage or respiratory distress.     Breath sounds: Normal breath sounds.  Abdominal:     General: Bowel sounds are normal.     Palpations: Abdomen is soft.  Musculoskeletal:     Cervical back: Normal range of motion and neck supple.     Right lower leg: No edema.     Left lower leg: No edema.  Lymphadenopathy:     Cervical: No cervical adenopathy.  Skin:    General: Skin is warm and dry.  Neurological:     Mental Status: She is alert and oriented to person, place, and time.  Psychiatric:        Attention and Perception: Attention normal.        Mood and Affect: Mood normal.        Speech: Speech normal.        Behavior: Behavior normal. Behavior is cooperative.        Thought Content: Thought content normal.        Judgment: Judgment normal.    Results for orders placed or performed in visit on 08/26/20  TSH  Result Value Ref Range   TSH 1.170 0.450 - 4.500 uIU/mL  T4, free  Result Value Ref Range   Free T4 1.21 0.82 - 1.77 ng/dL  Lipid Panel w/o Chol/HDL Ratio  Result Value Ref Range   Cholesterol, Total 211 (H) 100 - 199 mg/dL   Triglycerides 137 0 - 149 mg/dL   HDL 80 >39 mg/dL   VLDL Cholesterol Cal 23 5 - 40 mg/dL   LDL Chol Calc (NIH) 108 (H) 0 - 99 mg/dL      Assessment & Plan:   Problem List Items Addressed This Visit       Respiratory   Obstructive sleep apnea of adult    Diagnosed September 2022, she has a CPAP and is using 100% of the time with improvement in symptoms.  Recommend continuing this.        Endocrine   Adult hypothyroidism - Primary    Chronic, ongoing.  Continue current medication dose and adjust as needed based on TSH/T4 levels. Labs today: TSH, Free T4, and antibody today.  Return in 6 months for follow-up.      Relevant Orders   T4, free   Thyroid peroxidase antibody   TSH   IFG (impaired fasting glucose)    A1c 5.8% last visit, recheck today and continue diet control.      Relevant Orders   HgB A1c     Other    Anxiety and depression    Chronic, stable.  Denies SI/HI.  Continue current medication regimen and adjust as needed.       HLD (hyperlipidemia)    Chronic, ongoing, tolerating Atorvastatin at 40 MG daily -- increased in July.  Continue regimen and adjust as needed. Lipid panel today.      Relevant Orders   Lipid Panel w/o Chol/HDL Ratio      Follow up plan: Return in about 6 months (around 08/20/2021) for Annual physical.

## 2021-02-19 NOTE — Assessment & Plan Note (Signed)
Chronic, ongoing.  Continue current medication dose and adjust as needed based on TSH/T4 levels. Labs today: TSH, Free T4, and antibody today.  Return in 6 months for follow-up.

## 2021-02-20 LAB — LIPID PANEL W/O CHOL/HDL RATIO
Cholesterol, Total: 199 mg/dL (ref 100–199)
HDL: 89 mg/dL (ref >39)
LDL Chol Calc (NIH): 97 mg/dL (ref 0–99)
Triglycerides: 71 mg/dL (ref 0–149)
VLDL Cholesterol Cal: 13 mg/dL (ref 5–40)

## 2021-02-20 LAB — TSH: TSH: 0.5 u[IU]/mL (ref 0.450–4.500)

## 2021-02-20 LAB — THYROID PEROXIDASE ANTIBODY: Thyroperoxidase Ab SerPl-aCnc: <9 IU/mL (ref 0–34)

## 2021-02-20 LAB — HEMOGLOBIN A1C
Est. average glucose Bld gHb Est-mCnc: 134 mg/dL
Hgb A1c MFr Bld: 6.3 % — ABNORMAL HIGH (ref 4.8–5.6)

## 2021-02-20 LAB — T4, FREE: Free T4: 1.22 ng/dL (ref 0.82–1.77)

## 2021-02-20 NOTE — Progress Notes (Signed)
Contacted via [place] afternoon [place], your labs have returned: - Thyroid labs are nice and normal, continue current medication. - Cholesterol labs are trending down some, continue Atorvastatin 40 MG every day. - The A1C is the diabetes testing we talked about, this looks at your blood sugars over the past 3 months and turns the average into a number.  Your number is 6.3%, meaning you are prediabetic -- trending up from previous 5.8%.  Any number 5.7 to 6.4 is considered prediabetes and any number 6.5 or greater is considered diabetes.   I would recommend heavy focus on decreasing foods high in sugar and your intake of things like bread products, pasta, and rice.  The American Diabetes Association online has a large amount of information on diet changes to make.  We will recheck this number in 3-6 months to ensure you are not continuing to trend upwards and move into diabetes. Any questions? Keep being amazing!!  Thank you for allowing me to participate in your care.  I appreciate you. Kindest regards, Yissel Habermehl

## 2021-02-26 ENCOUNTER — Ambulatory Visit: Payer: BC Managed Care – PPO | Admitting: Nurse Practitioner

## 2021-03-05 ENCOUNTER — Encounter: Payer: Self-pay | Admitting: Nurse Practitioner

## 2021-03-06 ENCOUNTER — Encounter: Payer: Self-pay | Admitting: Nurse Practitioner

## 2021-03-06 ENCOUNTER — Other Ambulatory Visit: Payer: Self-pay

## 2021-03-06 ENCOUNTER — Ambulatory Visit (INDEPENDENT_AMBULATORY_CARE_PROVIDER_SITE_OTHER): Payer: 59 | Admitting: Nurse Practitioner

## 2021-03-06 DIAGNOSIS — R21 Rash and other nonspecific skin eruption: Secondary | ICD-10-CM | POA: Insufficient documentation

## 2021-03-06 MED ORDER — PREDNISONE 10 MG PO TABS: ORAL_TABLET | ORAL | 0 refills | Status: DC

## 2021-03-06 MED ORDER — TRIAMCINOLONE ACETONIDE 0.1 % EX CREA: 1.0000 "application " | TOPICAL_CREAM | Freq: Two times a day (BID) | CUTANEOUS | 0 refills | Status: DC

## 2021-03-06 NOTE — Progress Notes (Signed)
BP 122/74    Pulse 64    Temp 97.9 F (36.6 C)    Ht 5\' 6"  (1.676 m)    Wt 216 lb (98 kg)    LMP  (LMP Unknown)    SpO2 97%    BMI 34.86 kg/m    Subjective:    Patient ID: Dominique Kerr, female    DOB: 10-18-1960, 61 y.o.   MRN: [id number]  HPI: Dominique Kerr is a 61 y.o. female  Chief Complaint  Patient presents with   Rash    Patient states she first the rash on her stomach about a week ago and then she noticed a rash on the top part of her back and then radiated down to her lower back and it itches really back. Patient denies trying any over the counter medication.    RASH Started on back about one month ago and then spread around to the front about one week ago.  Under bra line and down spine.  No recent bug bites.  No well water.  No medication or product changes. Duration:  months  Location: back and trunk  Itching: yes Burning: no Redness: yes Oozing: no Scaling: no Blisters: no Painful: no Fevers: no Change in detergents/soaps/personal care products: no Recent illness: sinus infection Recent travel:no History of same: no Context: stable Alleviating factors: nothing Treatments attempted:nothing Shortness of breath: no  Throat/tongue swelling: no Myalgias/arthralgias: no   Relevant past medical, surgical, family and social history reviewed and updated as indicated. Interim medical history since our last visit reviewed. Allergies and medications reviewed and updated.  Review of Systems  Constitutional: Negative.   Respiratory: Negative.    Cardiovascular: Negative.   Gastrointestinal: Negative.   Skin:  Positive for rash.  Neurological: Negative.   Psychiatric/Behavioral: Negative.     Per HPI unless specifically indicated above     Objective:    BP 122/74    Pulse 64    Temp 97.9 F (36.6 C)    Ht 5\' 6"  (1.676 m)    Wt 216 lb (98 kg)    LMP  (LMP Unknown)    SpO2 97%    BMI 34.86 kg/m   Wt Readings from Last 3 Encounters:  03/06/21 216  lb (98 kg)  02/19/21 215 lb 9.6 oz (97.8 kg)  10/09/20 208 lb (94.3 kg)    Physical Exam Vitals and nursing note reviewed.  Constitutional:      General: She is awake. She is not in acute distress.    Appearance: She is well-developed. She is not ill-appearing.  HENT:     Head: Normocephalic.     Right Ear: Hearing normal.     Left Ear: Hearing normal.  Eyes:     General: Lids are normal.        Right eye: No discharge.        Left eye: No discharge.     Conjunctiva/sclera: Conjunctivae normal.     Pupils: Pupils are equal, round, and reactive to light.  Neck:     Thyroid: No thyromegaly.     Vascular: No carotid bruit.  Cardiovascular:     Rate and Rhythm: Normal rate and regular rhythm.     Heart sounds: Normal heart sounds. No murmur heard.   No gallop.  Pulmonary:     Effort: Pulmonary effort is normal. No accessory muscle usage or respiratory distress.     Breath sounds: Normal breath sounds.  Abdominal:     General:  Bowel sounds are normal.     Palpations: Abdomen is soft.  Musculoskeletal:     Cervical back: Normal range of motion and neck supple.     Right lower leg: No edema.     Left lower leg: No edema.  Lymphadenopathy:     Cervical: No cervical adenopathy.  Skin:    General: Skin is warm and dry.     Findings: Rash present. Rash is macular.     Comments: Scattered macular rash noted around anterior upper abdomen and midline mid back area.  No blisters.  Pale pink in color with areas of linear scratch marks.    Neurological:     Mental Status: She is alert and oriented to person, place, and time.  Psychiatric:        Attention and Perception: Attention normal.        Mood and Affect: Mood normal.        Speech: Speech normal.        Behavior: Behavior normal. Behavior is cooperative.        Thought Content: Thought content normal.        Judgment: Judgment normal.   Results for orders placed or performed in visit on 02/19/21  T4, free  Result Value  Ref Range   Free T4 1.22 0.82 - 1.77 ng/dL  Thyroid peroxidase antibody  Result Value Ref Range   Thyroperoxidase Ab SerPl-aCnc <9 0 - 34 IU/mL  TSH  Result Value Ref Range   TSH 0.500 0.450 - 4.500 uIU/mL  Lipid Panel w/o Chol/HDL Ratio  Result Value Ref Range   Cholesterol, Total 199 100 - 199 mg/dL   Triglycerides 71 0 - 149 mg/dL   HDL 89 >39 mg/dL   VLDL Cholesterol Cal 13 5 - 40 mg/dL   LDL Chol Calc (NIH) 97 0 - 99 mg/dL  HgB A1c  Result Value Ref Range   Hgb A1c MFr Bld 6.3 (H) 4.8 - 5.6 %   Est. average glucose Bld gHb Est-mCnc 134 mg/dL      Assessment & Plan:   Problem List Items Addressed This Visit       Musculoskeletal and Integument   Rash    Acute x one month -- no improvement and no treatment at home.  Appears allergic in nature.  No vesicles concerning for shingles.  At this time will send in Prednisone taper and Triamcinolone cream for treatment.  Recommend she monitor food intake and current products she is using at home -- document if any reactions appear with these.  Consider allergy testing if ongoing.  Return in 3 weeks.        Follow up plan: Return in about 3 weeks (around 03/27/2021) for Rash .

## 2021-03-06 NOTE — Patient Instructions (Signed)
Rash, Adult  A rash is a change in the color of your skin. A rash can also change the way your skin feels. There are many different conditions and factors that can causea rash. Follow these instructions at home: The goal of treatment is to stop the itching and keep the rash from spreading. Watch for any changes in your symptoms. Let your doctor know about them. Followthese instructions to help with your condition: Medicine Take or apply over-the-counter and prescription medicines only as told by your doctor. These may include medicines: To treat red or swollen skin (corticosteroid creams). To treat itching. To treat an allergy (oral antihistamines). To treat very bad symptoms (oral corticosteroids).  Skin care Put cool cloths (compresses) on the affected areas. Do not scratch or rub your skin. Avoid covering the rash. Make sure that the rash is exposed to air as much as possible. Managing itching and discomfort Avoid hot showers or baths. These can make itching worse. A cold shower may help. Try taking a bath with: Epsom salts. You can get these at your local pharmacy or grocery store. Follow the instructions on the package. Baking soda. Pour a small amount into the bath as told by your doctor. Colloidal oatmeal. You can get this at your local pharmacy or grocery store. Follow the instructions on the package. Try putting baking soda paste onto your skin. Stir water into baking soda until it gets like a paste. Try putting on a lotion that relieves itchiness (calamine lotion). Keep cool and out of the sun. Sweating and being hot can make itching worse. General instructions  Rest as needed. Drink enough fluid to keep your pee (urine) pale yellow. Wear loose-fitting clothing. Avoid scented soaps, detergents, and perfumes. Use gentle soaps, detergents, perfumes, and other cosmetic products. Avoid anything that causes your rash. Keep a journal to help track what causes your rash. Write  down: What you eat. What cosmetic products you use. What you drink. What you wear. This includes jewelry. Keep all follow-up visits as told by your doctor. This is important.  Contact a doctor if: You sweat at night. You lose weight. You pee (urinate) more than normal. You pee less than normal, or you notice that your pee is a darker color than normal. You feel weak. You throw up (vomit). Your skin or the whites of your eyes look yellow (jaundice). Your skin: Tingles. Is numb. Your rash: Does not go away after a few days. Gets worse. You are: More thirsty than normal. More tired than normal. You have: New symptoms. Pain in your belly (abdomen). A fever. Watery poop (diarrhea). Get help right away if: You have a fever and your symptoms suddenly get worse. You start to feel mixed up (confused). You have a very bad headache or a stiff neck. You have very bad joint pains or stiffness. You have jerky movements that you cannot control (seizure). Your rash covers all or most of your body. The rash may or may not be painful. You have blisters that: Are on top of the rash. Grow larger. Grow together. Are painful. Are inside your nose or mouth. You have a rash that: Looks like purple pinprick-sized spots all over your body. Has a "bull's eye" or looks like a target. Is red and painful, causes your skin to peel, and is not from being in the sun too long. Summary A rash is a change in the color of your skin. A rash can also change the way your skin feels.   The goal of treatment is to stop the itching and keep the rash from spreading. Take or apply over-the-counter and prescription medicines only as told by your doctor. Contact a doctor if you have new symptoms or symptoms that get worse. Keep all follow-up visits as told by your doctor. This is important. This information is not intended to replace advice given to you by your health care provider. Make sure you discuss any  questions you have with your healthcare provider. Document Revised: 06/02/2018 Document Reviewed: 09/12/2017 Elsevier Patient Education  2022 Elsevier Inc.  

## 2021-03-06 NOTE — Assessment & Plan Note (Signed)
Acute x one month -- no improvement and no treatment at home.  Appears allergic in nature.  No vesicles concerning for shingles.  At this time will send in Prednisone taper and Triamcinolone cream for treatment.  Recommend she monitor food intake and current products she is using at home -- document if any reactions appear with these.  Consider allergy testing if ongoing.  Return in 3 weeks.

## 2021-03-12 ENCOUNTER — Other Ambulatory Visit: Payer: Self-pay | Admitting: Internal Medicine

## 2021-03-12 NOTE — Telephone Encounter (Signed)
Requested medications are due for refill today.  Unsure  Requested medications are on the active medications list.  no  Last refill. 01/26/2021  Future visit scheduled.   yes  Notes to clinic.  Medication discontinued 02/19/2021 - Course completed.     Requested Prescriptions  Pending Prescriptions Disp Refills   albuterol (VENTOLIN HFA) 108 (90 Base) MCG/ACT inhaler [Pharmacy Med Name: ALBUTEROL HFA (PROAIR) INHALER] 8.5 each 1    Sig: TAKE 2 PUFFS BY MOUTH EVERY 6 HOURS AS NEEDED FOR WHEEZE OR SHORTNESS OF BREATH     Pulmonology:  Beta Agonists Failed - 03/12/2021  3:12 AM      Failed - One inhaler should last at least one month. If the patient is requesting refills earlier, contact the patient to check for uncontrolled symptoms.      Passed - Valid encounter within last 12 months    Recent Outpatient Visits           6 days ago [place], Barbaraann Faster, NP   3 weeks ago Adult hypothyroidism   [place], Jolene T, NP   1 month ago Acute frontal sinusitis, recurrence not specified   [place] Vigg, Avanti, MD   6 months ago Adult hypothyroidism   [place] Cannady, Barbaraann Faster, NP   8 months ago Adult hypothyroidism   [place], Barbaraann Faster, NP       Future Appointments             In 2 weeks Cannady, Barbaraann Faster, NP [facility], [place]   In 5 months [place], Barbaraann Faster, NP [facility]

## 2021-04-01 ENCOUNTER — Ambulatory Visit: Payer: 59 | Admitting: Nurse Practitioner

## 2021-04-28 ENCOUNTER — Ambulatory Visit (INDEPENDENT_AMBULATORY_CARE_PROVIDER_SITE_OTHER): Payer: 59 | Admitting: Adult Health

## 2021-04-28 ENCOUNTER — Encounter: Payer: Self-pay | Admitting: Adult Health

## 2021-04-28 VITALS — BP 126/80 | HR 65 | Ht 66.0 in | Wt 216.0 lb

## 2021-04-28 DIAGNOSIS — G4733 Obstructive sleep apnea (adult) (pediatric): Secondary | ICD-10-CM | POA: Diagnosis not present

## 2021-04-28 NOTE — Progress Notes (Signed)
[facility] [address] [place] [address] [phone]       OFFICE FOLLOW UP NOTE  [name] Date of Birth:  [birth year] Medical Record Number:  [id number]   Reason for visit: Initial CPAP follow-up    SUBJECTIVE:   CHIEF COMPLAINT:  Chief Complaint  Patient presents with   Obstructive Sleep Apnea    RM 3 alone Pt is well and stable, no concerns with CPAP.     HPI:   Update 04/28/2021 JM: Returns for initial CPAP compliance visit.  Completed HST 11/19/2020 which showed severe OSA with total AHI of 63.3/h and O2 nadir of 61% for nearly 1 hour, indicating nocturnal hypoxemia.  Recommended initiation of AutoPap and titration study if indicated in the future.   Review of compliance report as below.  Excellent compliance at 100% although residual AHI 11.7. Currently using silicon full face mask tolerating well. Overall no complaints and tolerating CPAP well. Does endorse some improvement of daytime fatigue. ESS 8/24 (prior to starting 10/24).            History provided for reference purposes only Consult visit 10/09/2020 Dr. Rexene Kerr: Dominique Kerr is a 61 year old right-handed woman with an underlying medical history of reflux disease, hypertension, hyperlipidemia, prediabetes, thyroid disease, arthritis, anemia, allergies, anxiety, and obesity with status post gastric sleeve, who was previously diagnosed with obstructive sleep apnea and placed on CPAP therapy.  I reviewed your office note from 07/08/2020.  She has not used her CPAP machine for the past 5 and half or 6 years.  Prior sleep study results were not available for my review today.  Sleep study testing was in [place].  She estimates that her study was in 2012 or 13.  She had weight loss surgery in 2013 or 2014.  She lost altogether about 50 pounds and gained a little bit of weight back over time.  She benefited from CPAP therapy but had some discomfort with her mask.  She was not able  to get supplies in a timely manner.  She used her machine altogether for maybe a year.  Compliance download is not available, since she has not used her machine in years.  Her Epworth sleepiness score is 10 out of 24.  She reports snoring and witnessed apneas, she has been noted to have pauses in her breathing by her best friend.  Her friend also uses a CPAP machine.  Patient's father had sleep apnea and had an older style machine.  Patient is single and lives with her sister.  She has no kids, they have 1 dog in the household.  She does have a TV in her bedroom and has been on at night but turns it off before falling asleep typically.  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  She is in bed typically between 8:30 PM and 9 PM and rise time is typically around 5 AM.  She has occasionally woken up with a headache.  She endorses daytime tiredness.  She has nocturia about once or twice per average night.  She quit smoking some 20 years ago.  She drinks alcohol occasionally, she drinks caffeine in the form of coffee, typically 2 cups in the mornings.       ROS:   14 system review of systems performed and negative with exception of those listed in HPI  PMH:  Past Medical History:  Diagnosis Date   Allergy    Anemia  YEARS AGO WHILE HAVING PERIODS   Anxiety    Arthritis    Dysplastic nevus 06/15/2006   epigastric area - moderate   GERD (gastroesophageal reflux disease)    HSV (herpes simplex virus) infection    Hyperlipidemia    Hypertension    H/O LOST WEIGHT AND NO MEDS X 6 YRS    Obesity    PMDD (premenstrual dysphoric disorder)    Pre-diabetes    PRIOR TO GASTRIC SLEEVE   Sleep apnea    DOES NOT USE CPAP   Sleep apnea    Thyroid disease     PSH:  Past Surgical History:  Procedure Laterality Date   BLADDER SURGERY     CHOLECYSTECTOMY     KNEE ARTHROSCOPY Left    KNEE ARTHROSCOPY Left 04/09/2020   Procedure: ARTHROSCOPY KNEE;  Surgeon: Dereck Leep, MD;  Location: [facility] ORS;  Service: Orthopedics;  Laterality: Left;   LAPAROSCOPIC GASTRIC SLEEVE RESECTION     parotid gland removal      Social History:  Social History   Socioeconomic History   Marital status: Single    Spouse name: Not on file   Number of children: Not on file   Years of education: Not on file   Highest education level: Not on file  Occupational History   Not on file  Tobacco Use   Smoking status: Former    Packs/day: 0.25    Years: 3.00    Pack years: 0.75    Types: Cigarettes    Quit date: 03/31/2000    Years since quitting: 21.0   Smokeless tobacco: Never  Vaping Use   Vaping Use: Never used  Substance and Sexual Activity   Alcohol use: Yes    Alcohol/week: 0.0 standard drinks    Comment: RARELY   Drug use: No   Sexual activity: Never  Other Topics Concern   Not on file  Social History Narrative   Not on file   Social Determinants of Health   Financial Resource Strain: Not on file  Food Insecurity: Not on file  Transportation Needs: Not on file  Physical Activity: Not on file  Stress: Not on file  Social Connections: Not on file  Intimate Partner Violence: Not on file    Family History:  Family History  Problem Relation Age of Onset   Diabetes Mother    Hepatitis C Mother    Sleep apnea Father    Arthritis Sister    Diabetes Maternal Grandmother    Breast cancer Neg Hx     Medications:   Current Outpatient Medications on File Prior to Visit  Medication Sig Dispense Refill   aspirin EC 81 MG tablet Take 81 mg by mouth daily.      atorvastatin (LIPITOR) 40 MG tablet Take 1 tablet (40 mg total) by mouth daily. 90 tablet 4   Cholecalciferol (VITAMIN D) 50 MCG (2000 UT) tablet Take 2,000 Units by mouth daily.     diphenhydramine-acetaminophen (TYLENOL PM) 25-500 MG TABS tablet Take 2 tablets by mouth at bedtime as needed (sleep).     estradiol (CLIMARA - DOSED IN MG/24 HR) 0.05 mg/24hr patch Place 0.05 mg onto the skin every  Friday.     levothyroxine (SYNTHROID) 150 MCG tablet Take 150 mcg by mouth daily.     Multiple Vitamin (MULTIVITAMIN WITH MINERALS) TABS tablet Take 1 tablet by mouth daily.     omeprazole (PRILOSEC) 20 MG capsule Take 1 capsule (20 mg total) by mouth daily. [place]  capsule 4   progesterone (PROMETRIUM) 200 MG capsule Take 200 mg by mouth at bedtime.     sertraline (ZOLOFT) 100 MG tablet Take 1 tablet (100 mg total) by mouth daily. 90 tablet 4   triamcinolone cream (KENALOG) 0.1 % Apply 1 application topically 2 (two) times daily. 30 g 0   No current facility-administered medications on file prior to visit.    Allergies:   Allergies  Allergen Reactions   Lisinopril Cough      OBJECTIVE:  Physical Exam  Vitals:   04/28/21 1102  BP: 126/80  Pulse: 65  Weight: 216 lb (98 kg)  Height: '5\' 6"'$  (1.676 m)   Body mass index is 34.86 kg/m. No results found.   General: well developed, well nourished, very pleasant middle aged Caucasian female, seated, in no evident distress Head: head normocephalic and atraumatic.   Neck: supple with no carotid or supraclavicular bruits Cardiovascular: regular rate and rhythm, no murmurs Musculoskeletal: no deformity Skin:  no rash/petichiae Vascular:  Normal pulses all extremities   Neurologic Exam Mental Status: Awake and fully alert. Oriented to place and time. Recent and remote memory intact. Attention span, concentration and fund of knowledge appropriate. Mood and affect appropriate.  Cranial Nerves: Pupils equal, briskly reactive to light. Extraocular movements full without nystagmus. Visual fields full to confrontation. Hearing intact. Facial sensation intact. Face, tongue, palate moves normally and symmetrically.  Motor: Normal bulk and tone. Normal strength in all tested extremity muscles Sensory.: intact to touch , pinprick , position and vibratory sensation.  Coordination: Rapid alternating movements normal in all extremities. Finger-to-nose  and heel-to-shin performed accurately bilaterally. Gait and Station: Arises from chair without difficulty. Stance is normal. Gait demonstrates normal stride length and balance without use of AD. Tandem walk and heel toe without difficulty.  Reflexes: 1+ and symmetric. Toes downgoing.         ASSESSMENT/PLAN: Dominique Kerr is a 61 y.o. year old female with severe OSA with total AHI of 63.3/h and nocturnal hypoxemia as per HST on 11/19/2020.      OSA on CPAP : excellent compliance although residual elevated AHI. Will increase min pressure from 5 to 7. She will bring machine back in 3-4 weeks to repeat download. If still uncontrolled, will recommend proceeding with titration study.  Continue to follow with DME company for any needed supplies or CPAP related concerns    Follow up in 6 months or call earlier if needed   CC:  PCP: Venita Lick, NP    I spent 23 minutes of face-to-face and non-face-to-face time with patient.  This included previsit chart review, lab review, study review, order entry, electronic health record documentation, patient education regarding diagnosis of sleep apnea with review and discussion of compliance report and answered all other questions to patient's satisfaction   Frann Rider, [facility]  [facility] [address] [place], [place] [address]  Phone [phone] Fax [phone] Note: This document was prepared with digital dictation and possible smart phrase technology. Any transcriptional errors that result from this process are unintentional.

## 2021-04-30 ENCOUNTER — Telehealth: Payer: Self-pay | Admitting: Adult Health

## 2021-04-30 NOTE — Telephone Encounter (Signed)
Contacted pt back, informed her the orders was placed during last visit and sent to [facility] via CM. She was appreciative of the call back.  ?

## 2021-04-30 NOTE — Telephone Encounter (Signed)
Pt states she was told to call and report if she has not heard from [facility] re: the pressure being lowered on her CPAP.  Please call pt  ?

## 2021-05-25 ENCOUNTER — Other Ambulatory Visit: Payer: Self-pay | Admitting: Internal Medicine

## 2021-05-26 NOTE — Telephone Encounter (Signed)
Requested medication (s) are due for refill today: Yes ? ?Requested medication (s) are on the active medication list: Yes ? ?Last refill:  08/30/20 ? ?Future visit scheduled: Yes ? ?Notes to clinic:  Unable to refill per protocol, last refill by another provider. ? ? ? ? ? ?Requested Prescriptions  ?Pending Prescriptions Disp Refills  ? levothyroxine (SYNTHROID) 150 MCG tablet [Pharmacy Med Name: LEVOTHYROXINE 150 MCG TABLET] 90 tablet 4  ?  Sig: TAKE 1 TABLET BY MOUTH EVERY DAY  ?  ? Endocrinology:  Hypothyroid Agents Passed - 05/25/2021  1:51 AM  ?  ?  Passed - TSH in normal range and within 360 days  ?  TSH  ?Date Value Ref Range Status  ?02/19/2021 0.500 0.450 - 4.500 uIU/mL Final  ?  ?  ?  ?  Passed - Valid encounter within last 12 months  ?  Recent Outpatient Visits   ? ?      ? 2 months ago Rash  ? [place], Henrine Screws T, NP  ? 3 months ago Adult hypothyroidism  ? [place], [place] T, NP  ? 4 months ago Acute frontal sinusitis, recurrence not specified  ? [facility] Vigg, Avanti, MD  ? 9 months ago Adult hypothyroidism  ? [place], [place] T, NP  ? 10 months ago Adult hypothyroidism  ? [facility] [place], Henrine Screws T, NP  ? ?  ?  ?Future Appointments   ? ?        ? In 2 months Cannady, Barbaraann Faster, NP [facility]  ? ?  ? ?  ?  ?  ? ? ? ? ?

## 2021-05-27 ENCOUNTER — Encounter: Payer: Self-pay | Admitting: Nurse Practitioner

## 2021-06-11 ENCOUNTER — Telehealth: Payer: Self-pay | Admitting: Adult Health

## 2021-06-11 DIAGNOSIS — G4733 Obstructive sleep apnea (adult) (pediatric): Secondary | ICD-10-CM

## 2021-06-11 NOTE — Telephone Encounter (Signed)
Obtain repeat compliance report from 3/19 -4/17 after adjustments made due to continued elevated AHI.  Current download shows even higher AHI at 28.5/h on AutoPap setting 7-15.  Recommend patient returning for formal titration study in order to adequately treat her apnea.  Order will be placed. ?

## 2021-06-30 ENCOUNTER — Telehealth: Payer: Self-pay

## 2021-06-30 NOTE — Telephone Encounter (Signed)
LVM for pt to call me back to schedule sleep study  

## 2021-07-12 ENCOUNTER — Ambulatory Visit (INDEPENDENT_AMBULATORY_CARE_PROVIDER_SITE_OTHER): Payer: 59 | Admitting: Neurology

## 2021-07-12 DIAGNOSIS — G4733 Obstructive sleep apnea (adult) (pediatric): Secondary | ICD-10-CM | POA: Diagnosis not present

## 2021-07-12 DIAGNOSIS — G4720 Circadian rhythm sleep disorder, unspecified type: Secondary | ICD-10-CM

## 2021-07-12 DIAGNOSIS — Z9989 Dependence on other enabling machines and devices: Secondary | ICD-10-CM

## 2021-07-21 NOTE — Procedures (Signed)
S PATIENT'S NAME:  Dominique Kerr, Dominique Kerr DOB:      1960-05-16      MR#:    [id number]     DATE OF RECORDING: 07/12/2021 REFERRING M.D.:  Marnee Guarneri, NP Study Performed:   CPAP  Titration HISTORY: 61 year old woman with a history of reflux disease, hypertension, hyperlipidemia, prediabetes, thyroid disease, arthritis, anemia, allergies, anxiety, and obesity with status post gastric sleeve, who presents for a formal titration study. Her home sleep test on 11/19/2020 showed severe OSA with a total AHI of 63.3/h and O2 nadir of 61%, with nocturnal hypoxemia. She has been on AutoPAP with excellent compliance, but elevated AHI. The patient endorsed the Epworth Sleepiness Scale at 8 points. The patient's weight 216 pounds with a height of 66 (inches), resulting in a BMI of 34.7 kg/m2.   CURRENT MEDICATIONS: Aspirin, Liptior, Vitamin D3, Tylenol PM, Climara, Synthorid, Multivitamin, Prilosec, Prometrium, Zoloft, Kenalog  PROCEDURE:  This is a multichannel digital polysomnogram utilizing the SomnoStar 11.2 system.  Electrodes and sensors were applied and monitored per AASM Specifications.   EEG, EOG, Chin and Limb EMG, were sampled at 200 Hz.  ECG, Snore and Nasal Pressure, Thermal Airflow, Respiratory Effort, CPAP Flow and Pressure, Oximetry was sampled at 50 Hz. Digital video and audio were recorded.      The patient was fitted with a medium AirTouch FFM from [employer]. CPAP was initiated at 10 cm H20 with heated humidity per AASM standards and pressure was advanced to 20 cmH20 because of hypopneas, apneas and desaturations.  At a PAP pressure of 20 cmH20, her AHI was 37.2/hour with O2 nadir of 70% during supine REM sleep. She was switched to standard BiPAP therapy and titrated from 20/16 cm to 25/20 cm, but had trouble tolerating BiPAP. She had an AHI of 6.2/hour on a bilevel pressure of 20/16 cm with 48.5 min of sleep achieved, and non-supine REM sleep, O2 nadir of 92%.   Lights Out was at 21:50 and Lights On at  05:18. Total recording time (TRT) was 448 minutes, with a total sleep time (TST) of 321 minutes. The patient's sleep latency was 42 minutes. REM latency was 205.5 minutes, which is delayed. The sleep efficiency was 71.7 %.    SLEEP ARCHITECTURE: WASO (Wake after sleep onset)  was 102.5 minutes with moderate sleep fragmentation noted. There were 21 minutes in Stage N1, 207 minutes Stage N2, 47.5 minutes Stage N3 and 45.5 minutes in Stage REM.  The percentage of Stage N1 was 6.5%, Stage N2 was 64.5%, which is increased, Stage N3 was 14.8% and Stage R (REM sleep) was 14.2%, which is reduced. The arousals were noted as: 85 were spontaneous, 0 were associated with PLMs, 27 were associated with respiratory events.  RESPIRATORY ANALYSIS:  There was a total of 65 respiratory events: 41 obstructive apneas, 3 central apneas and 0 mixed apneas with a total of 44 apneas and an apnea index (AI) of 8.2 /hour. There were 21 hypopneas with a hypopnea index of 3.9/hour. The patient also had 0 respiratory event related arousals (RERAs).      The total APNEA/HYPOPNEA INDEX  (AHI) was 12.1 /hour and the total RESPIRATORY DISTURBANCE INDEX was 12.1 /hour  13 events occurred in REM sleep and 52 events in NREM. The REM AHI was 17.1 /hour versus a non-REM AHI of 11.3 /hour.  The patient spent 159.5 minutes of total sleep time in the supine position and 162 minutes in non-supine. The supine AHI was 20.3, versus a non-supine AHI  of 4.1.  OXYGEN SATURATION & C02:  The baseline 02 saturation was 96%, with the lowest being 70%. Time spent below 89% saturation equaled 27 minutes.  PERIODIC LIMB MOVEMENTS:  The patient had a total of 0 Periodic Limb Movements. The Periodic Limb Movement (PLM) index was 0 and the PLM Arousal index was 0 /hour.  Audio and video analysis did not show any abnormal or unusual movements, behaviors, phonations or vocalizations. The patient took no bathroom breaks. Snoring was reduced. The EKG was in keeping  with normal sinus rhythm (NSR).  Post-study, the patient indicated that sleep was worse than usual.   DIAGNOSIS Severe obstructive sleep apnea Insufficient treatment with CPAP Dysfunctions associated with sleep stages or arousals from sleep  PLANS/RECOMMENDATIONS: This was a challenging titration study, due to severe sleep apnea, and high pressure requirements. The patient did reasonably well on BiPAP of 20/16 cm with medium FFM (on her sides). CPAP at even 20 cm was insufficient for proper treatment of her sleep apnea with severe supine REM related respiratory events and severe desaturations. I recommend BiPAP at 20/16 cm. Patient will be advised to try to sleep on her sides and continue to work on weight loss. The patient should be reminded to be fully compliant with PAP therapy to improve sleep related symptoms and decrease long term cardiovascular risks. The patient should be reminded, that it may take up to 3 months to get fully used to using PAP with all planned sleep. The earlier full compliance is achieved, the better long term compliance tends to be. Please note that untreated obstructive sleep apnea may carry additional perioperative morbidity. Patients with significant obstructive sleep apnea should receive perioperative PAP therapy and the surgeons and particularly the anesthesiologist should be informed of the diagnosis and the severity of the sleep disordered breathing. This study shows sleep fragmentation and abnormal sleep stage percentages; these are nonspecific findings and per se do not signify an intrinsic sleep disorder or a cause for the patient's sleep-related symptoms. Causes include (but are not limited to) the first night effect of the sleep study, circadian rhythm disturbances, medication effect or an underlying mood disorder or medical problem.  The patient should be cautioned not to drive, work at heights, or operate dangerous or heavy equipment when tired or sleepy. Review  and reiteration of good sleep hygiene measures should be pursued with any patient. The patient will be seen in follow-up in the sleep clinic at [facility] for discussion of the test results, symptom and treatment compliance review, further management strategies, etc. The referring provider will be notified of the test results.  I certify that I have reviewed the entire raw data recording prior to the issuance of this report in accordance with the Standards of Accreditation of the American Academy of Sleep Medicine (AASM)    Star Age, MD, PhD Diplomat, American Board of Neurology and Sleep Medicine (Neurology and Sleep Medicine)

## 2021-07-22 ENCOUNTER — Telehealth: Payer: Self-pay | Admitting: Adult Health

## 2021-07-22 DIAGNOSIS — Z9989 Dependence on other enabling machines and devices: Secondary | ICD-10-CM

## 2021-07-22 DIAGNOSIS — G4730 Sleep apnea, unspecified: Secondary | ICD-10-CM

## 2021-07-22 NOTE — Telephone Encounter (Signed)
Based on recent titration study, order will be placed to initiate BiPAP on 20/16 with medium FFM. She will need to be scheduled for follow-up visit in 60 to 90 days per insurance requirements and to ensure adequate treatment of apnea with changing to BiPAP.     Per recent titration study by Dr. Rexene Alberts  "DIAGNOSIS Severe obstructive sleep apnea Insufficient treatment with CPAP Dysfunctions associated with sleep stages or arousals from sleep   PLANS/RECOMMENDATIONS: This was a challenging titration study, due to severe sleep apnea, and high pressure requirements. The patient did reasonably well on BiPAP of 20/16 cm with medium FFM (on her sides). CPAP at even 20 cm was insufficient for proper treatment of her sleep apnea with severe supine REM related respiratory events and severe desaturations. I recommend BiPAP at 20/16 cm. Patient will be advised to try to sleep on her sides and continue to work on weight loss. The patient should be reminded to be fully compliant with PAP therapy to improve sleep related symptoms and decrease long term cardiovascular risks. The patient should be reminded, that it may take up to 3 months to get fully used to using PAP with all planned sleep. The earlier full compliance is achieved, the better long term compliance tends to be. Please note that untreated obstructive sleep apnea may carry additional perioperative morbidity. Patients with significant obstructive sleep apnea should receive perioperative PAP therapy and the surgeons and particularly the anesthesiologist should be informed of the diagnosis and the severity of the sleep disordered breathing. This study shows sleep fragmentation and abnormal sleep stage percentages; these are nonspecific findings and per se do not signify an intrinsic sleep disorder or a cause for the patient's sleep-related symptoms. Causes include (but are not limited to) the first night effect of the sleep study, circadian rhythm  disturbances, medication effect or an underlying mood disorder or medical problem.  The patient should be cautioned not to drive, work at heights, or operate dangerous or heavy equipment when tired or sleepy. Review and reiteration of good sleep hygiene measures should be pursued with any patient. The patient will be seen in follow-up in the sleep clinic at [facility] for discussion of the test results, symptom and treatment compliance review, further management strategies, etc. The referring provider will be notified of the test results."

## 2021-07-22 NOTE — Telephone Encounter (Signed)
I called pt. I advised pt that Dr. Rexene Alberts reviewed their sleep study results and found that pt has severe OSA. Dr. Rexene Alberts recommends that pt start on BIPAP, previously on CPAP. I reviewed PAP compliance expectations with the pt. Pt is agreeable to starting a BiPAP. I advised pt that an order will be sent to a DME, [facility], and [facility] will call the pt within about one week after they file with the pt's insurance. [facility] will show the pt how to use the machine, fit for masks, and troubleshoot the BiPAP if needed. A follow up appt was made for insurance purposes with Janett Billow, NP on 10/15/2021 at 315 pm. Pt verbalized understanding to arrive 15 minutes early and bring their BiPAP. A letter with all of this information in it will be mailed to the pt as a reminder. I verified with the pt that the address we have on file is correct. Pt verbalized understanding of results. Pt had no questions at this time but was encouraged to call back if questions arise. I have sent the order to [facility] and have received confirmation that they have received the order. Of note pt was previously established with [facility] and preferred to stay with them

## 2021-08-05 ENCOUNTER — Telehealth: Payer: Self-pay | Admitting: Adult Health

## 2021-08-05 NOTE — Telephone Encounter (Signed)
Pt called stating that her DME is still waiting on the prescription for her Bipap supplies. Please advise.

## 2021-08-06 NOTE — Telephone Encounter (Signed)
CMM sent to [facility]  From: Kary Kos, CMA  Sent: 08/06/2021   7:54 AM EDT  To: Vanessa Ralphs; Marchelle Gearing  Subject: new orderns                                    Hey!! This pt called in saying you all are still waiting for her orders for supplies.  I'm not sure if someone else sent them or not but they were placed during last visit 3/7 when she was on CPAP. Looks like she went to BiPAP 5/31. Do you have them ? If not can you help? Please and thank you!!  DOB 2060-06-04     Dominique Kerr, CMA; Vanessa Ralphs so it looks like she is getting setup today with the BIPAP machine. She should get her supplies while she is in there!

## 2021-08-11 ENCOUNTER — Other Ambulatory Visit: Payer: Self-pay | Admitting: Nurse Practitioner

## 2021-08-11 NOTE — Telephone Encounter (Signed)
Requested Prescriptions  Pending Prescriptions Disp Refills  . sertraline (ZOLOFT) 100 MG tablet [Pharmacy Med Name: SERTRALINE HCL 100 MG TABLET] 90 tablet 0    Sig: TAKE 1 TABLET BY MOUTH EVERY DAY     Psychiatry:  Antidepressants - SSRI - sertraline Failed - 08/11/2021  1:52 AM      Failed - AST in normal range and within 360 days    AST  Date Value Ref Range Status  07/08/2020 18 0 - 40 IU/L Final         Failed - ALT in normal range and within 360 days    ALT  Date Value Ref Range Status  07/08/2020 14 0 - 32 IU/L Final         Passed - Completed PHQ-2 or PHQ-9 in the last 360 days      Passed - Valid encounter within last 6 months    Recent Outpatient Visits          5 months ago [place], Barbaraann Faster, NP   5 months ago Adult hypothyroidism   [place], Jolene T, NP   6 months ago Acute frontal sinusitis, recurrence not specified   [place], Avanti, MD   11 months ago Adult hypothyroidism   [place], [place] T, NP   1 year ago Adult hypothyroidism   [place], Barbaraann Faster, NP      Future Appointments            In 1 week [place], Barbaraann Faster, NP [facility]

## 2021-08-12 ENCOUNTER — Other Ambulatory Visit: Payer: Self-pay | Admitting: Nurse Practitioner

## 2021-08-12 NOTE — Telephone Encounter (Signed)
Requested Prescriptions  Pending Prescriptions Disp Refills  . omeprazole (PRILOSEC) 20 MG capsule [Pharmacy Med Name: OMEPRAZOLE DR 20 MG CAPSULE] 90 capsule 0    Sig: TAKE 1 CAPSULE BY MOUTH EVERY DAY     Gastroenterology: Proton Pump Inhibitors Passed - 08/12/2021  1:44 AM      Passed - Valid encounter within last 12 months    Recent Outpatient Visits          5 months ago [place], Barbaraann Faster, NP   5 months ago Adult hypothyroidism   [place], Jolene T, NP   6 months ago Acute frontal sinusitis, recurrence not specified   [place], MD   11 months ago Adult hypothyroidism   [place], [place] T, NP   1 year ago Adult hypothyroidism   [place], Barbaraann Faster, NP      Future Appointments            In 1 week [place], Barbaraann Faster, NP [facility]

## 2021-08-16 NOTE — Patient Instructions (Signed)

## 2021-08-20 ENCOUNTER — Encounter: Payer: Self-pay | Admitting: Nurse Practitioner

## 2021-08-20 ENCOUNTER — Ambulatory Visit (INDEPENDENT_AMBULATORY_CARE_PROVIDER_SITE_OTHER): Payer: 59 | Admitting: Nurse Practitioner

## 2021-08-20 VITALS — BP 137/73 | HR 61 | Temp 98.4°F | Ht 66.0 in | Wt 212.4 lb

## 2021-08-20 DIAGNOSIS — Z0000 Encounter for general adult medical examination without abnormal findings: Secondary | ICD-10-CM | POA: Diagnosis not present

## 2021-08-20 DIAGNOSIS — E559 Vitamin D deficiency, unspecified: Secondary | ICD-10-CM

## 2021-08-20 DIAGNOSIS — K219 Gastro-esophageal reflux disease without esophagitis: Secondary | ICD-10-CM

## 2021-08-20 DIAGNOSIS — R7301 Impaired fasting glucose: Secondary | ICD-10-CM | POA: Diagnosis not present

## 2021-08-20 DIAGNOSIS — F32A Depression, unspecified: Secondary | ICD-10-CM

## 2021-08-20 DIAGNOSIS — F419 Anxiety disorder, unspecified: Secondary | ICD-10-CM | POA: Diagnosis not present

## 2021-08-20 DIAGNOSIS — E039 Hypothyroidism, unspecified: Secondary | ICD-10-CM | POA: Diagnosis not present

## 2021-08-20 DIAGNOSIS — E7800 Pure hypercholesterolemia, unspecified: Secondary | ICD-10-CM | POA: Diagnosis not present

## 2021-08-20 DIAGNOSIS — Z79890 Hormone replacement therapy (postmenopausal): Secondary | ICD-10-CM

## 2021-08-20 DIAGNOSIS — Z23 Encounter for immunization: Secondary | ICD-10-CM | POA: Diagnosis not present

## 2021-08-20 DIAGNOSIS — G4733 Obstructive sleep apnea (adult) (pediatric): Secondary | ICD-10-CM

## 2021-08-20 LAB — MICROALBUMIN, URINE WAIVED
Creatinine, Urine Waived: 50 mg/dL (ref 10–300)
Microalb, Ur Waived: 10 mg/L (ref 0–19)
Microalb/Creat Ratio: <30 mg/g (ref <30)

## 2021-08-20 LAB — BAYER DCA HB A1C WAIVED: HB A1C (BAYER DCA - WAIVED): 6.1 % — ABNORMAL HIGH (ref 4.8–5.6)

## 2021-08-20 MED ORDER — ATORVASTATIN CALCIUM 40 MG PO TABS
40.0000 mg | ORAL_TABLET | Freq: Every day | ORAL | 4 refills | Status: DC
Start: 2021-08-20 — End: 2022-08-23

## 2021-08-20 MED ORDER — OMEPRAZOLE 20 MG PO CPDR: DELAYED_RELEASE_CAPSULE | ORAL | 4 refills | Status: DC

## 2021-08-20 MED ORDER — SHINGRIX 50 MCG/0.5ML IM SUSR: 0.5000 mL | Freq: Once | INTRAMUSCULAR | 0 refills | Status: AC

## 2021-08-20 MED ORDER — SERTRALINE HCL 100 MG PO TABS
100.0000 mg | ORAL_TABLET | Freq: Every day | ORAL | 4 refills | Status: DC
Start: 2021-08-20 — End: 2022-08-23

## 2021-08-20 NOTE — Progress Notes (Signed)
BP 137/73   Pulse 61   Temp 98.4 F (36.9 C) (Oral)   Ht '5\' 6"'$  (1.676 m)   Wt 212 lb 6.4 oz (96.3 kg)   LMP  (LMP Unknown)   SpO2 97%   BMI 34.28 kg/m    Subjective:    Patient ID: Dominique Kerr, female    DOB: 1960-03-31, 61 y.o.   MRN: [id number]  HPI: Dominique Kerr is a 61 y.o. female presenting on 08/20/2021 for comprehensive medical examination. Current medical complaints include:none  She currently lives with: sister Menopausal Symptoms: no  NOTE WRITTEN BY FNP STUDENT.  ASSESSMENT AND PLAN OF CARE REVIEWED WITH STUDENT, AGREE WITH ABOVE FINDINGS AND PLAN.   Continues to followed by GYN who fills her hormone treatment.  HYPERLIPIDEMIA Continues on Atorvastatin 40 MG daily.  Was diagnosed with severe OSA in May 2023 and recommended BiPAP. Hyperlipidemia status: stable Satisfied with current treatment?  no Side effects:  no Medication compliance: good compliance Supplements: none Aspirin:  yes The 10-year ASCVD risk score (Arnett DK, et al., 2019) is: 2.1%   Values used to calculate the score:     Age: 66 years     Sex: Female     Is Non-Hispanic African American: No     Diabetic: No     Tobacco smoker: No     Systolic Blood Pressure: 448 mmHg     Is BP treated: No     HDL Cholesterol: 89 mg/dL     Total Cholesterol: 199 mg/dL Chest pain:  no Coronary artery disease:  no Family history CAD:  no Family history early CAD:  no   HYPOTHYROIDISM Continues on Levothyroxine 150 MCG daily. Thyroid control status:controlled Satisfied with current treatment? yes Medication side effects: no Medication compliance: excellent compliance Etiology of hypothyroidism:  Recent dose adjustment:no Fatigue: no Cold intolerance: no Heat intolerance: no Weight gain: no Weight loss: no Constipation: no Diarrhea/loose stools: no Palpitations: no Lower extremity edema: no Anxiety/depressed mood: no   DEPRESSION Continues on Sertraline 100 MG daily. Mood  status: stable Satisfied with current treatment?: yes Symptom severity: mild  Duration of current treatment : years Side effects: no Medication compliance: excellent compliance Psychotherapy/counseling: yes, in the past. Depressed mood: no Anxious mood: yes related to work Anhedonia: no Significant weight loss or gain: no Insomnia: no  Fatigue: no Feelings of worthlessness or guilt: no Impaired concentration/indecisiveness: no Suicidal ideations: no Hopelessness: no Crying spells: no    08/20/2021    8:32 AM 02/19/2021    9:32 AM 05/22/2020    8:13 AM 11/12/2019    8:38 AM 05/08/2019    8:19 AM  Depression screen PHQ 2/9  Decreased Interest 0 0 0 0 0  Down, Depressed, Hopeless 1 0 0 1 1  PHQ - 2 Score 1 0 0 1 1  Altered sleeping 0 0 0 0 0  Tired, decreased energy 1 0 0 1 0  Change in appetite 1 0 0 0 1  Feeling bad or failure about yourself  0 0 0 0 0  Trouble concentrating 0 0 0 0 0  Moving slowly or fidgety/restless 0 0 0 0 0  Suicidal thoughts 0 0 0 0 0  PHQ-9 Score 3 0 0 2 2  Difficult doing work/chores Not difficult at all Not difficult at all  Not difficult at all Not difficult at all       08/20/2021    8:32 AM 02/19/2021    9:32 AM 11/12/2019  8:38 AM 05/08/2019    8:19 AM  GAD 7 : Generalized Anxiety Score  Nervous, Anxious, on Edge 0 '1 1 1  '$ Control/stop worrying 0 0 1 1  Worry too much - different things 1 0 1 0  Trouble relaxing 0 0 0 0  Restless 0 0 0 0  Easily annoyed or irritable 1 0 1 1  Afraid - awful might happen 0 0 0 0  Total GAD 7 Score '2 1 4 3  '$ Anxiety Difficulty Not difficult at all Not difficult at all Not difficult at all Not difficult at all        11/03/2018    8:26 AM 08/20/2021    8:31 AM  [place] in the past year? 0 0  Number of falls in past year - Comments  Simultaneous filing. User may not have seen previous data.  Was there an injury with Fall? 0 0  Was there an injury with Fall? - Comments  Simultaneous filing. User  may not have seen previous data.  Fall Risk Category Calculator 0 0  Fall Risk Category Calculator - Comments  Simultaneous filing. User may not have seen previous data.  Fall Risk Category Low Low  Fall Risk Category - Comments  Simultaneous filing. User may not have seen previous data.  Patient Fall Risk Level Low fall risk Low fall risk  Patient Fall Risk Level - Comments  Simultaneous filing. User may not have seen previous data.  Patient at Risk for Falls Due to  No Fall Risks  Patient at Risk for Falls Due to - Comments  Simultaneous filing. User may not have seen previous data.  Fall risk Follow up Falls evaluation completed Falls prevention discussed  Fall risk Follow up - Comments  Simultaneous filing. User may not have seen previous data.    Functional Status Survey: Is the patient deaf or have difficulty hearing?: No Does the patient have difficulty seeing, even when wearing glasses/contacts?: No Does the patient have difficulty concentrating, remembering, or making decisions?: No Does the patient have difficulty walking or climbing stairs?: No Does the patient have difficulty dressing or bathing?: No Does the patient have difficulty doing errands alone such as visiting a doctor's office or shopping?: No    Past Medical History:  Past Medical History:  Diagnosis Date   Allergy    Anemia    YEARS AGO WHILE HAVING PERIODS   Anxiety    Arthritis    Dysplastic nevus 06/15/2006   epigastric area - moderate   GERD (gastroesophageal reflux disease)    HSV (herpes simplex virus) infection    Hyperlipidemia    Hypertension    H/O LOST WEIGHT AND NO MEDS X 6 YRS    Obesity    PMDD (premenstrual dysphoric disorder)    Pre-diabetes    PRIOR TO GASTRIC SLEEVE   Sleep apnea    DOES NOT USE CPAP   Sleep apnea    Thyroid disease     Surgical History:  Past Surgical History:  Procedure Laterality Date   BLADDER SURGERY     CHOLECYSTECTOMY     KNEE ARTHROSCOPY Left     KNEE ARTHROSCOPY Left 04/09/2020   Procedure: ARTHROSCOPY KNEE;  Surgeon: Dereck Leep, MD;  Location: [facility] ORS;  Service: Orthopedics;  Laterality: Left;   LAPAROSCOPIC GASTRIC SLEEVE RESECTION     parotid gland removal      Medications:  Current Outpatient Medications on File Prior to Visit  Medication Sig  aspirin EC 81 MG tablet Take 81 mg by mouth daily.    Cholecalciferol (VITAMIN D) 50 MCG (2000 UT) tablet Take 2,000 Units by mouth daily.   diphenhydramine-acetaminophen (TYLENOL PM) 25-500 MG TABS tablet Take 2 tablets by mouth at bedtime as needed (sleep).   estradiol (CLIMARA - DOSED IN MG/24 HR) 0.05 mg/24hr patch Place 0.05 mg onto the skin every Friday.   levothyroxine (SYNTHROID) 150 MCG tablet TAKE 1 TABLET BY MOUTH EVERY DAY   Multiple Vitamin (MULTIVITAMIN WITH MINERALS) TABS tablet Take 1 tablet by mouth daily.   progesterone (PROMETRIUM) 200 MG capsule Take 200 mg by mouth at bedtime.   triamcinolone cream (KENALOG) 0.1 % Apply 1 application topically 2 (two) times daily.   No current facility-administered medications on file prior to visit.    Allergies:  Allergies  Allergen Reactions   Lisinopril Cough    Social History:  Social History   Socioeconomic History   Marital status: Single    Spouse name: Not on file   Number of children: Not on file   Years of education: Not on file   Highest education level: Not on file  Occupational History   Not on file  Tobacco Use   Smoking status: Former    Packs/day: 0.25    Years: 3.00    Total pack years: 0.75    Types: Cigarettes    Quit date: 03/31/2000    Years since quitting: 21.4   Smokeless tobacco: Never  Vaping Use   Vaping Use: Never used  Substance and Sexual Activity   Alcohol use: Yes    Alcohol/week: 0.0 standard drinks of alcohol    Comment: RARELY   Drug use: No   Sexual activity: Never  Other Topics Concern   Not on file  Social History Narrative   Not on file   Social Determinants  of Health   Financial Resource Strain: Not on file  Food Insecurity: Not on file  Transportation Needs: Not on file  Physical Activity: Not on file  Stress: Not on file  Social Connections: Not on file  Intimate Partner Violence: Not on file   Social History   Tobacco Use  Smoking Status Former   Packs/day: 0.25   Years: 3.00   Total pack years: 0.75   Types: Cigarettes   Quit date: 03/31/2000   Years since quitting: 21.4  Smokeless Tobacco Never   Social History   Substance and Sexual Activity  Alcohol Use Yes   Alcohol/week: 0.0 standard drinks of alcohol   Comment: RARELY    Family History:  Family History  Problem Relation Age of Onset   Diabetes Mother    Hepatitis C Mother    Sleep apnea Father    Arthritis Sister    Diabetes Maternal Grandmother    Breast cancer Neg Hx     Past medical history, surgical history, medications, allergies, family history and social history reviewed with patient today and changes made to appropriate areas of the chart.   Review of Systems  All other systems reviewed and are negative.  All other ROS negative except what is listed above and in the HPI.      Objective:    BP 137/73   Pulse 61   Temp 98.4 F (36.9 C) (Oral)   Ht '5\' 6"'$  (1.676 m)   Wt 212 lb 6.4 oz (96.3 kg)   LMP  (LMP Unknown)   SpO2 97%   BMI 34.28 kg/m   Wt Readings from  Last 3 Encounters:  08/20/21 212 lb 6.4 oz (96.3 kg)  04/28/21 216 lb (98 kg)  03/06/21 216 lb (98 kg)    Physical Exam Vitals and nursing note reviewed. Exam conducted with a chaperone present.  Constitutional:      Appearance: Normal appearance. She is obese. She is not ill-appearing or toxic-appearing.  HENT:     Head: Normocephalic.     Right Ear: Hearing, tympanic membrane, ear canal and external ear normal. No drainage.     Left Ear: Hearing, tympanic membrane, ear canal and external ear normal. No drainage.     Nose: Nose normal.     Mouth/Throat:     Lips: Pink.      Mouth: Mucous membranes are moist.     Pharynx: Oropharynx is clear. Uvula midline. No posterior oropharyngeal erythema.  Eyes:     General: Lids are normal.     Extraocular Movements: Extraocular movements intact.     Conjunctiva/sclera: Conjunctivae normal.     Pupils: Pupils are equal, round, and reactive to light.  Neck:     Thyroid: No thyromegaly or thyroid tenderness.  Cardiovascular:     Rate and Rhythm: Normal rate and regular rhythm.     Pulses: Normal pulses.     Heart sounds: Normal heart sounds. No murmur heard.    No gallop.  Pulmonary:     Effort: Pulmonary effort is normal. No accessory muscle usage or respiratory distress.     Breath sounds: Normal breath sounds.  Chest:  Breasts:    Right: Normal.     Left: Normal.  Abdominal:     General: Bowel sounds are normal.     Palpations: Abdomen is soft.     Tenderness: There is no abdominal tenderness. There is no right CVA tenderness or left CVA tenderness.  Musculoskeletal:     Cervical back: Normal range of motion.  Lymphadenopathy:     Head:     Right side of head: No submental, submandibular, tonsillar, preauricular or posterior auricular adenopathy.     Left side of head: No submental, submandibular, tonsillar, preauricular or posterior auricular adenopathy.     Cervical: No cervical adenopathy.  Skin:    General: Skin is warm.  Neurological:     General: No focal deficit present.     Mental Status: She is alert and oriented to person, place, and time.     Deep Tendon Reflexes: Reflexes are normal and symmetric.  Psychiatric:        Attention and Perception: Attention normal.        Mood and Affect: Mood normal.        Speech: Speech normal.        Behavior: Behavior normal. Behavior is cooperative.        Thought Content: Thought content normal.        Cognition and Memory: Cognition normal.        Judgment: Judgment normal.     Results for orders placed or performed in visit on 05/28/21  HM PAP  SMEAR  Result Value Ref Range   HM Pap smear see result scanned into chart       Assessment & Plan:   Problem List Items Addressed This Visit       Respiratory   Obstructive sleep apnea of adult    Recent sleep study in May 2023 showed severe sleep apnea. She is now using BiPAP at home instead of CPAP. Recommend to continue this.  Digestive   GERD (gastroesophageal reflux disease)    Chronic, ongoing. Continue Prilosec daily. Recommend to trial a slow reduction if tolerated. Check mag level today.       Relevant Medications   omeprazole (PRILOSEC) 20 MG capsule   Other Relevant Orders   Magnesium     Endocrine   Adult hypothyroidism - Primary    Chronic, ongoing.  Continue current medication dose and adjust as needed Labs today: TSH, Free T4.  Return in 6 months for follow-up.      Relevant Orders   TSH   T4, free   IFG (impaired fasting glucose)    A1c 6.1% today, slight increase from previous. Recommend a continued focus on diet control.       Relevant Orders   Bayer DCA Hb A1c Waived   Microalbumin, Urine Waived     Other   Anxiety and depression    Chronic, stable. Continue current medication regimen and adjust as needed. Denies SI/HI.       Relevant Medications   sertraline (ZOLOFT) 100 MG tablet   HLD (hyperlipidemia)    Chronic, ongoing. Tolerating Atorvastatin at 40 MG daily. Continue regimen and adjust as needed. Lipid panel today.       Relevant Medications   atorvastatin (LIPITOR) 40 MG tablet   Other Relevant Orders   Comprehensive metabolic panel   Lipid Panel w/o Chol/HDL Ratio   Postmenopausal HRT (hormone replacement therapy)    Continue collaboration with GYN and continue medication regimen as prescribed by them.       Relevant Orders   CBC with Differential/Platelet   Vitamin D deficiency    Chronic, ongoing. Continue supplement and check Vit D level today. Plan for DEXA at age 89.       Relevant Orders   VITAMIN D 25  Hydroxy (Vit-D Deficiency, Fractures)   Other Visit Diagnoses     Need for shingles vaccine       Shingrix ordered.   Encounter for annual physical exam       Annual physical with labs today and health maintenance reviewed with patient.   Relevant Orders   CBC with Differential/Platelet        Follow up plan: Return in about 6 months (around 02/19/2022) for HTN/HLD, MOOD, OSA.   LABORATORY TESTING:  - Pap smear: up to date  IMMUNIZATIONS:   - Tdap: Tetanus vaccination status reviewed: last tetanus booster within 10 years. - Influenza: Up to date - Pneumovax: Not applicable - Prevnar: Not applicable - COVID: Up to date - HPV: Not applicable - Shingrix vaccine: Sent to pharmacy.  SCREENING: -Mammogram: Up to date  - Colonoscopy: Up to date  - Bone Density: Not applicable  -Hearing Test: Not applicable  -Spirometry: Not applicable   PATIENT COUNSELING:   Advised to take 1 mg of folate supplement per day if capable of pregnancy.   Sexuality: Discussed sexually transmitted diseases, partner selection, use of condoms, avoidance of unintended pregnancy  and contraceptive alternatives.   Advised to avoid cigarette smoking.  I discussed with the patient that most people either abstain from alcohol or drink within safe limits (<=14/week and <=4 drinks/occasion for males, <=7/weeks and <= 3 drinks/occasion for females) and that the risk for alcohol disorders and other health effects rises proportionally with the number of drinks per week and how often a drinker exceeds daily limits.  Discussed cessation/primary prevention of drug use and availability of treatment for abuse.   Diet: Encouraged to adjust caloric  intake to maintain  or achieve ideal body weight, to reduce intake of dietary saturated fat and total fat, to limit sodium intake by avoiding high sodium foods and not adding table salt, and to maintain adequate dietary potassium and calcium preferably from fresh fruits,  vegetables, and low-fat dairy products.    Stressed the importance of regular exercise  Injury prevention: Discussed safety belts, safety helmets, smoke detector, smoking near bedding or upholstery.   Dental health: Discussed importance of regular tooth brushing, flossing, and dental visits.    NEXT PREVENTATIVE PHYSICAL DUE IN 1 YEAR. Return in about 6 months (around 02/19/2022) for HTN/HLD, MOOD, OSA.

## 2021-08-20 NOTE — Progress Notes (Deleted)
BP 137/73   Pulse 61   Temp 98.4 F (36.9 C) (Oral)   Ht '5\' 6"'$  (1.676 m)   Wt 212 lb 6.4 oz (96.3 kg)   LMP  (LMP Unknown)   SpO2 97%   BMI 34.28 kg/m    Subjective:    Patient ID: Dominique Kerr, female    DOB: 1960/08/09, 61 y.o.   MRN: [id number]  HPI: Dominique Kerr is a 61 y.o. female presenting on 08/20/2021 for comprehensive medical examination. Current medical complaints include:{Blank single:19197::"none","***"}  She currently lives with: Menopausal Symptoms: {Blank single:19197::"yes","no"}  HYPERLIPIDEMIA Continues on Atorvastatin 40 MG daily.  Was diagnosed with severe OSA in May 2023 and recommended BiPAP. Hyperlipidemia status: {Blank single:19197::"excellent compliance","good compliance","fair compliance","poor compliance"} Satisfied with current treatment?  {Blank single:19197::"yes","no"} Side effects:  {Blank single:19197::"yes","no"} Medication compliance: {Blank single:19197::"excellent compliance","good compliance","fair compliance","poor compliance"} Supplements: {Blank multiple:19196::"none","fish oil","niacin","red yeast rice"} Aspirin:  {Blank single:19197::"yes","no"} The 10-year ASCVD risk score (Arnett DK, et al., 2019) is: 2.1%   Values used to calculate the score:     Age: 21 years     Sex: Female     Is Non-Hispanic African American: No     Diabetic: No     Tobacco smoker: No     Systolic Blood Pressure: 119 mmHg     Is BP treated: No     HDL Cholesterol: 89 mg/dL     Total Cholesterol: 199 mg/dL Chest pain:  {Blank single:19197::"yes","no"} Coronary artery disease:  {Blank single:19197::"yes","no"} Family history CAD:  {Blank single:19197::"yes","no"} Family history early CAD:  {Blank single:19197::"yes","no"}   HYPOTHYROIDISM Continues on Levothyroxine 150 MCG daily. Thyroid control status:{Blank single:19197::"controlled","uncontrolled","better","worse","exacerbated","stable"} Satisfied with current treatment? {Blank  single:19197::"yes","no"} Medication side effects: {Blank single:19197::"yes","no"} Medication compliance: {Blank single:19197::"excellent compliance","good compliance","fair compliance","poor compliance"} Etiology of hypothyroidism:  Recent dose adjustment:{Blank single:19197::"yes","no"} Fatigue: {Blank single:19197::"yes","no"} Cold intolerance: {Blank single:19197::"yes","no"} Heat intolerance: {Blank single:19197::"yes","no"} Weight gain: {Blank single:19197::"yes","no"} Weight loss: {Blank single:19197::"yes","no"} Constipation: {Blank single:19197::"yes","no"} Diarrhea/loose stools: {Blank single:19197::"yes","no"} Palpitations: {Blank single:19197::"yes","no"} Lower extremity edema: {Blank single:19197::"yes","no"} Anxiety/depressed mood: {Blank single:19197::"yes","no"}   DEPRESSION Continues on Sertraline 100 MG daily. Mood status: {Blank single:19197::"controlled","uncontrolled","better","worse","exacerbated","stable"} Satisfied with current treatment?: {Blank single:19197::"yes","no"} Symptom severity: {Blank single:19197::"mild","moderate","severe"}  Duration of current treatment : {Blank single:19197::"chronic","months","years"} Side effects: {Blank single:19197::"yes","no"} Medication compliance: {Blank single:19197::"excellent compliance","good compliance","fair compliance","poor compliance"} Psychotherapy/counseling: {Blank single:19197::"yes","no"}  Depressed mood: {Blank single:19197::"yes","no"} Anxious mood: {Blank single:19197::"yes","no"} Anhedonia: {Blank single:19197::"yes","no"} Significant weight loss or gain: {Blank single:19197::"yes","no"} Insomnia: {Blank single:19197::"yes","no"} {Blank single:19197::"hard to fall asleep","hard to stay asleep"} Fatigue: {Blank single:19197::"yes","no"} Feelings of worthlessness or guilt: {Blank single:19197::"yes","no"} Impaired concentration/indecisiveness: {Blank single:19197::"yes","no"} Suicidal ideations: {Blank  single:19197::"yes","no"} Hopelessness: {Blank single:19197::"yes","no"} Crying spells: {Blank single:19197::"yes","no"}    08/20/2021    8:32 AM 02/19/2021    9:32 AM 05/22/2020    8:13 AM 11/12/2019    8:38 AM 05/08/2019    8:19 AM  Depression screen PHQ 2/9  Decreased Interest 0 0 0 0 0  Down, Depressed, Hopeless 1 0 0 1 1  PHQ - 2 Score 1 0 0 1 1  Altered sleeping 0 0 0 0 0  Tired, decreased energy 1 0 0 1 0  Change in appetite 1 0 0 0 1  Feeling bad or failure about yourself  0 0 0 0 0  Trouble concentrating 0 0 0 0 0  Moving slowly or fidgety/restless 0 0 0 0 0  Suicidal thoughts 0 0 0 0 0  PHQ-9 Score 3 0 0 2 2  Difficult doing work/chores Not difficult at all Not difficult  at all  Not difficult at all Not difficult at all       08/20/2021    8:32 AM 02/19/2021    9:32 AM 11/12/2019    8:38 AM 05/08/2019    8:19 AM  GAD 7 : Generalized Anxiety Score  Nervous, Anxious, on Edge 0 '1 1 1  '$ Control/stop worrying 0 0 1 1  Worry too much - different things 1 0 1 0  Trouble relaxing 0 0 0 0  Restless 0 0 0 0  Easily annoyed or irritable 1 0 1 1  Afraid - awful might happen 0 0 0 0  Total GAD 7 Score '2 1 4 3  '$ Anxiety Difficulty Not difficult at all Not difficult at all Not difficult at all Not difficult at all         11/03/2018    8:26 AM 08/20/2021    8:31 AM  [place] in the past year? 0 0  Number of falls in past year - Comments  Simultaneous filing. User may not have seen previous data.  Was there an injury with Fall? 0 0  Was there an injury with Fall? - Comments  Simultaneous filing. User may not have seen previous data.  Fall Risk Category Calculator 0 0  Fall Risk Category Calculator - Comments  Simultaneous filing. User may not have seen previous data.  Fall Risk Category Low Low  Fall Risk Category - Comments  Simultaneous filing. User may not have seen previous data.  Patient Fall Risk Level Low fall risk Low fall risk  Patient Fall Risk Level -  Comments  Simultaneous filing. User may not have seen previous data.  Patient at Risk for Falls Due to  No Fall Risks  Patient at Risk for Falls Due to - Comments  Simultaneous filing. User may not have seen previous data.  Fall risk Follow up Falls evaluation completed Falls prevention discussed  Fall risk Follow up - Comments  Simultaneous filing. User may not have seen previous data.    Functional Status Survey: Is the patient deaf or have difficulty hearing?: No Does the patient have difficulty seeing, even when wearing glasses/contacts?: No Does the patient have difficulty concentrating, remembering, or making decisions?: No Does the patient have difficulty walking or climbing stairs?: No Does the patient have difficulty dressing or bathing?: No Does the patient have difficulty doing errands alone such as visiting a doctor's office or shopping?: No    Past Medical History:  Past Medical History:  Diagnosis Date   Allergy    Anemia    YEARS AGO WHILE HAVING PERIODS   Anxiety    Arthritis    Dysplastic nevus 06/15/2006   epigastric area - moderate   GERD (gastroesophageal reflux disease)    HSV (herpes simplex virus) infection    Hyperlipidemia    Hypertension    H/O LOST WEIGHT AND NO MEDS X 6 YRS    Obesity    PMDD (premenstrual dysphoric disorder)    Pre-diabetes    PRIOR TO GASTRIC SLEEVE   Sleep apnea    DOES NOT USE CPAP   Sleep apnea    Thyroid disease     Surgical History:  Past Surgical History:  Procedure Laterality Date   BLADDER SURGERY     CHOLECYSTECTOMY     KNEE ARTHROSCOPY Left    KNEE ARTHROSCOPY Left 04/09/2020   Procedure: ARTHROSCOPY KNEE;  Surgeon: Dereck Leep, MD;  Location: [facility] ORS;  Service: Orthopedics;  Laterality: Left;   LAPAROSCOPIC GASTRIC SLEEVE RESECTION     parotid gland removal      Medications:  Current Outpatient Medications on File Prior to Visit  Medication Sig   aspirin EC 81 MG tablet Take 81 mg by mouth daily.     atorvastatin (LIPITOR) 40 MG tablet Take 1 tablet (40 mg total) by mouth daily.   Cholecalciferol (VITAMIN D) 50 MCG (2000 UT) tablet Take 2,000 Units by mouth daily.   diphenhydramine-acetaminophen (TYLENOL PM) 25-500 MG TABS tablet Take 2 tablets by mouth at bedtime as needed (sleep).   estradiol (CLIMARA - DOSED IN MG/24 HR) 0.05 mg/24hr patch Place 0.05 mg onto the skin every Friday.   levothyroxine (SYNTHROID) 150 MCG tablet TAKE 1 TABLET BY MOUTH EVERY DAY   Multiple Vitamin (MULTIVITAMIN WITH MINERALS) TABS tablet Take 1 tablet by mouth daily.   omeprazole (PRILOSEC) 20 MG capsule TAKE 1 CAPSULE BY MOUTH EVERY DAY   progesterone (PROMETRIUM) 200 MG capsule Take 200 mg by mouth at bedtime.   sertraline (ZOLOFT) 100 MG tablet TAKE 1 TABLET BY MOUTH EVERY DAY   triamcinolone cream (KENALOG) 0.1 % Apply 1 application topically 2 (two) times daily.   No current facility-administered medications on file prior to visit.    Allergies:  Allergies  Allergen Reactions   Lisinopril Cough    Social History:  Social History   Socioeconomic History   Marital status: Single    Spouse name: Not on file   Number of children: Not on file   Years of education: Not on file   Highest education level: Not on file  Occupational History   Not on file  Tobacco Use   Smoking status: Former    Packs/day: 0.25    Years: 3.00    Total pack years: 0.75    Types: Cigarettes    Quit date: 03/31/2000    Years since quitting: 21.4   Smokeless tobacco: Never  Vaping Use   Vaping Use: Never used  Substance and Sexual Activity   Alcohol use: Yes    Alcohol/week: 0.0 standard drinks of alcohol    Comment: RARELY   Drug use: No   Sexual activity: Never  Other Topics Concern   Not on file  Social History Narrative   Not on file   Social Determinants of Health   Financial Resource Strain: Not on file  Food Insecurity: Not on file  Transportation Needs: Not on file  Physical Activity: Not on file   Stress: Not on file  Social Connections: Not on file  Intimate Partner Violence: Not on file   Social History   Tobacco Use  Smoking Status Former   Packs/day: 0.25   Years: 3.00   Total pack years: 0.75   Types: Cigarettes   Quit date: 03/31/2000   Years since quitting: 21.4  Smokeless Tobacco Never   Social History   Substance and Sexual Activity  Alcohol Use Yes   Alcohol/week: 0.0 standard drinks of alcohol   Comment: RARELY    Family History:  Family History  Problem Relation Age of Onset   Diabetes Mother    Hepatitis C Mother    Sleep apnea Father    Arthritis Sister    Diabetes Maternal Grandmother    Breast cancer Neg Hx     Past medical history, surgical history, medications, allergies, family history and social history reviewed with patient today and changes made to appropriate areas of the chart.   ROS All other ROS  negative except what is listed above and in the HPI.      Objective:    BP 137/73   Pulse 61   Temp 98.4 F (36.9 C) (Oral)   Ht '5\' 6"'$  (1.676 m)   Wt 212 lb 6.4 oz (96.3 kg)   LMP  (LMP Unknown)   SpO2 97%   BMI 34.28 kg/m   Wt Readings from Last 3 Encounters:  08/20/21 212 lb 6.4 oz (96.3 kg)  04/28/21 216 lb (98 kg)  03/06/21 216 lb (98 kg)    Physical Exam  Results for orders placed or performed in visit on 05/28/21  HM PAP SMEAR  Result Value Ref Range   HM Pap smear see result scanned into chart       Assessment & Plan:   Problem List Items Addressed This Visit       Respiratory   Obstructive sleep apnea of adult     Digestive   GERD (gastroesophageal reflux disease)   Relevant Orders   Magnesium     Endocrine   Adult hypothyroidism - Primary   Relevant Orders   TSH   T4, free   IFG (impaired fasting glucose)   Relevant Orders   Bayer DCA Hb A1c Waived   Microalbumin, Urine Waived     Other   Anxiety and depression   HLD (hyperlipidemia)   Relevant Orders   Comprehensive metabolic panel   Lipid  Panel w/o Chol/HDL Ratio   Postmenopausal HRT (hormone replacement therapy)   Relevant Orders   CBC with Differential/Platelet   Vitamin D deficiency   Relevant Orders   VITAMIN D 25 Hydroxy (Vit-D Deficiency, Fractures)   Other Visit Diagnoses     Encounter for annual physical exam       Annual physical with labs today and health maintenance reviewed with patient.   Relevant Orders   CBC with Differential/Platelet        Follow up plan: No follow-ups on file.   LABORATORY TESTING:  - Pap smear: {Blank XKGYJE:56314::"HFW done","not applicable","up to date","done elsewhere"}  IMMUNIZATIONS:   - Tdap: Tetanus vaccination status reviewed: {tetanus status:315746}. - Influenza: {Blank single:19197::"Up to date","Administered today","Postponed to flu season","Refused","Given elsewhere"} - Pneumovax: {Blank single:19197::"Up to date","Administered today","Not applicable","Refused","Given elsewhere"} - Prevnar: {Blank single:19197::"Up to date","Administered today","Not applicable","Refused","Given elsewhere"} - COVID: {Blank single:19197::"Up to date","Administered today","Not applicable","Refused","Given elsewhere"} - HPV: {Blank single:19197::"Up to date","Administered today","Not applicable","Refused","Given elsewhere"} - Shingrix vaccine: {Blank single:19197::"Up to date","Administered today","Not applicable","Refused","Given elsewhere"}  SCREENING: -Mammogram: {Blank single:19197::"Up to date","Ordered today","Not applicable","Refused","Done elsewhere"}  - Colonoscopy: {Blank single:19197::"Up to date","Ordered today","Not applicable","Refused","Done elsewhere"}  - Bone Density: {Blank single:19197::"Up to date","Ordered today","Not applicable","Refused","Done elsewhere"}  -Hearing Test: {Blank single:19197::"Up to date","Ordered today","Not applicable","Refused","Done elsewhere"}  -Spirometry: {Blank single:19197::"Up to date","Ordered today","Not applicable","Refused","Done  elsewhere"}   PATIENT COUNSELING:   Advised to take 1 mg of folate supplement per day if capable of pregnancy.   Sexuality: Discussed sexually transmitted diseases, partner selection, use of condoms, avoidance of unintended pregnancy  and contraceptive alternatives.   Advised to avoid cigarette smoking.  I discussed with the patient that most people either abstain from alcohol or drink within safe limits (<=14/week and <=4 drinks/occasion for males, <=7/weeks and <= 3 drinks/occasion for females) and that the risk for alcohol disorders and other health effects rises proportionally with the number of drinks per week and how often a drinker exceeds daily limits.  Discussed cessation/primary prevention of drug use and availability of treatment for abuse.   Diet: Encouraged to adjust caloric  intake to maintain  or achieve ideal body weight, to reduce intake of dietary saturated fat and total fat, to limit sodium intake by avoiding high sodium foods and not adding table salt, and to maintain adequate dietary potassium and calcium preferably from fresh fruits, vegetables, and low-fat dairy products.    Stressed the importance of regular exercise  Injury prevention: Discussed safety belts, safety helmets, smoke detector, smoking near bedding or upholstery.   Dental health: Discussed importance of regular tooth brushing, flossing, and dental visits.    NEXT PREVENTATIVE PHYSICAL DUE IN 1 YEAR. No follow-ups on file.

## 2021-08-20 NOTE — Assessment & Plan Note (Signed)
Chronic, ongoing.  Continue current medication dose and adjust as needed Labs today: TSH, Free T4.  Return in 6 months for follow-up.

## 2021-08-20 NOTE — Assessment & Plan Note (Signed)
Chronic, ongoing. Continue supplement and check Vit D level today. Plan for DEXA at age 61.

## 2021-08-20 NOTE — Assessment & Plan Note (Signed)
Continue collaboration with GYN and continue medication regimen as prescribed by them.

## 2021-08-20 NOTE — Progress Notes (Signed)
Contacted via [place] afternoon Dominique Kerr, your A1c trending down slightly but remains in prediabetic range at 6.1%, was 6.3%.  Please continue heavy focus on diet and regular activity.

## 2021-08-20 NOTE — Assessment & Plan Note (Signed)
Chronic, ongoing. Continue Prilosec daily. Recommend to trial a slow reduction if tolerated. Check mag level today.

## 2021-08-20 NOTE — Assessment & Plan Note (Signed)
Recent sleep study in May 2023 showed severe sleep apnea. She is now using BiPAP at home instead of CPAP. Recommend to continue this.

## 2021-08-20 NOTE — Assessment & Plan Note (Signed)
Chronic, stable.  Continue current medication regimen and adjust as needed.  Denies SI/HI. 

## 2021-08-20 NOTE — Assessment & Plan Note (Addendum)
A1c 6.1% today, slight increase from previous. Recommend a continued focus on diet control.

## 2021-08-20 NOTE — Assessment & Plan Note (Signed)
Chronic, ongoing. Tolerating Atorvastatin at 40 MG daily. Continue regimen and adjust as needed. Lipid panel today.

## 2021-08-21 LAB — CBC WITH DIFFERENTIAL/PLATELET
Basophils Absolute: 0.1 10*3/uL (ref 0.0–0.2)
Basos: 1 %
EOS (ABSOLUTE): 0.2 10*3/uL (ref 0.0–0.4)
Eos: 4 %
Hematocrit: 37 % (ref 34.0–46.6)
Hemoglobin: 11.9 g/dL (ref 11.1–15.9)
Immature Grans (Abs): 0 10*3/uL (ref 0.0–0.1)
Immature Granulocytes: 1 %
Lymphocytes Absolute: 1.1 10*3/uL (ref 0.7–3.1)
Lymphs: 26 %
MCH: 27.2 pg (ref 26.6–33.0)
MCHC: 32.2 g/dL (ref 31.5–35.7)
MCV: 85 fL (ref 79–97)
Monocytes Absolute: 0.3 10*3/uL (ref 0.1–0.9)
Monocytes: 8 %
Neutrophils Absolute: 2.7 10*3/uL (ref 1.4–7.0)
Neutrophils: 60 %
Platelets: 274 10*3/uL (ref 150–450)
RBC: 4.38 x10E6/uL (ref 3.77–5.28)
RDW: 13.7 % (ref 11.7–15.4)
WBC: 4.4 10*3/uL (ref 3.4–10.8)

## 2021-08-21 LAB — LIPID PANEL W/O CHOL/HDL RATIO
Cholesterol, Total: 197 mg/dL (ref 100–199)
HDL: 82 mg/dL (ref >39)
LDL Chol Calc (NIH): 101 mg/dL — ABNORMAL HIGH (ref 0–99)
Triglycerides: 81 mg/dL (ref 0–149)
VLDL Cholesterol Cal: 14 mg/dL (ref 5–40)

## 2021-08-21 LAB — COMPREHENSIVE METABOLIC PANEL
ALT: 41 IU/L — ABNORMAL HIGH (ref 0–32)
AST: 32 IU/L (ref 0–40)
Albumin/Globulin Ratio: 1.8 (ref 1.2–2.2)
Albumin: 4.4 g/dL (ref 3.8–4.9)
Alkaline Phosphatase: 81 IU/L (ref 44–121)
BUN/Creatinine Ratio: 22 (ref 9–23)
BUN: 16 mg/dL (ref 6–24)
Bilirubin Total: 0.4 mg/dL (ref 0.0–1.2)
CO2: 25 mmol/L (ref 20–29)
Calcium: 9.4 mg/dL (ref 8.7–10.2)
Chloride: 99 mmol/L (ref 96–106)
Creatinine, Ser: 0.72 mg/dL (ref 0.57–1.00)
Globulin, Total: 2.5 g/dL (ref 1.5–4.5)
Glucose: 98 mg/dL (ref 70–99)
Potassium: 4.3 mmol/L (ref 3.5–5.2)
Sodium: 137 mmol/L (ref 134–144)
Total Protein: 6.9 g/dL (ref 6.0–8.5)
eGFR: 97 mL/min/{1.73_m2} (ref >59)

## 2021-08-21 LAB — MAGNESIUM: Magnesium: 2.2 mg/dL (ref 1.6–2.3)

## 2021-08-21 LAB — VITAMIN D 25 HYDROXY (VIT D DEFICIENCY, FRACTURES): Vit D, 25-Hydroxy: 39.4 ng/mL (ref 30.0–100.0)

## 2021-08-21 LAB — T4, FREE: Free T4: 1.46 ng/dL (ref 0.82–1.77)

## 2021-08-21 LAB — TSH: TSH: 1.27 u[IU]/mL (ref 0.450–4.500)

## 2021-08-21 NOTE — Progress Notes (Signed)
Contacted via [place] afternoon Kadance, your labs have returned: - Kidney function, creatinine and eGFR, remains normal.  Liver function shows mild elevation in ALT, but normal AST.   - LDL is elevated on cholesterol levels above what I would like to see with you taking Atorvastatin.  Are you taking this every day?  Were you fasting?  Let me know as we may need to change this or dosing.  Would like to see LDL <70. - Remainder of labs are all stable.  Continue current Levothyroxine dosing. Keep being amazing!!  Thank you for allowing me to participate in your care.  I appreciate you. Kindest regards, Ahaana Rochette

## 2021-10-15 ENCOUNTER — Ambulatory Visit: Payer: 59 | Admitting: Adult Health

## 2021-10-20 NOTE — Progress Notes (Unsigned)
[facility] [address] [place]. [place] [address] [phone]       OFFICE FOLLOW UP NOTE  Dominique Kerr Date of Birth:  [birth year] Medical Record Number:  [id number]   Reason for visit: Initial CPAP follow-up    SUBJECTIVE:   CHIEF COMPLAINT:  No chief complaint on file.   HPI:   Update 10/21/2021 JM: Patient returns for compliance visit after recently being switched to BiPAP after titration study on 5/21 which showed improvement of AHI to 6.2 on bilevel pressure of 20/16 but noted supine sleep with worsening AHI and desaturations.          History provided for reference purposes only Update 04/28/2021 JM: Returns for initial CPAP compliance visit.  Completed HST 11/19/2020 which showed severe OSA with total AHI of 63.3/h and O2 nadir of 61% for nearly 1 hour, indicating nocturnal hypoxemia.  Recommended initiation of AutoPap and titration study if indicated in the future.   Review of compliance report as below.  Excellent compliance at 100% although residual AHI 11.7. Currently using silicon full face mask tolerating well. Overall no complaints and tolerating CPAP well. Does endorse some improvement of daytime fatigue. ESS 8/24 (prior to starting 10/24).            History provided for reference purposes only Consult visit 10/09/2020 Dr. Rexene Alberts: Dominique Kerr is a 61 year old right-handed woman with an underlying medical history of reflux disease, hypertension, hyperlipidemia, prediabetes, thyroid disease, arthritis, anemia, allergies, anxiety, and obesity with status post gastric sleeve, who was previously diagnosed with obstructive sleep apnea and placed on CPAP therapy.  I reviewed your office note from 07/08/2020.  She has not used her CPAP machine for the past 5 and half or 6 years.  Prior sleep study results were not available for my review today.  Sleep study testing was in [place].  She estimates that her study was in 2012 or 13.  She  had weight loss surgery in 2013 or 2014.  She lost altogether about 50 pounds and gained a little bit of weight back over time.  She benefited from CPAP therapy but had some discomfort with her mask.  She was not able to get supplies in a timely manner.  She used her machine altogether for maybe a year.  Compliance download is not available, since she has not used her machine in years.  Her Epworth sleepiness score is 10 out of 24.  She reports snoring and witnessed apneas, she has been noted to have pauses in her breathing by her best friend.  Her friend also uses a CPAP machine.  Patient's father had sleep apnea and had an older style machine.  Patient is single and lives with her sister.  She has no kids, they have 1 dog in the household.  She does have a TV in her bedroom and has been on at night but turns it off before falling asleep typically.  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  She is in bed typically between 8:30 PM and 9 PM and rise time is typically around 5 AM.  She has occasionally woken up with a headache.  She endorses daytime tiredness.  She has nocturia about once or twice per average night.  She quit smoking some 20 years ago.  She drinks alcohol occasionally, she drinks caffeine in the form of coffee, typically 2 cups in the mornings.       ROS:  14 system review of systems performed and negative with exception of those listed in HPI  PMH:  Past Medical History:  Diagnosis Date   Allergy    Anemia    YEARS AGO WHILE HAVING PERIODS   Anxiety    Arthritis    Dysplastic nevus 06/15/2006   epigastric area - moderate   GERD (gastroesophageal reflux disease)    HSV (herpes simplex virus) infection    Hyperlipidemia    Hypertension    H/O LOST WEIGHT AND NO MEDS X 6 YRS    Obesity    PMDD (premenstrual dysphoric disorder)    Pre-diabetes    PRIOR TO GASTRIC SLEEVE   Sleep apnea    DOES NOT USE CPAP   Sleep apnea    Thyroid disease      PSH:  Past Surgical History:  Procedure Laterality Date   BLADDER SURGERY     CHOLECYSTECTOMY     KNEE ARTHROSCOPY Left    KNEE ARTHROSCOPY Left 04/09/2020   Procedure: ARTHROSCOPY KNEE;  Surgeon: Dereck Leep, MD;  Location: [facility] ORS;  Service: Orthopedics;  Laterality: Left;   LAPAROSCOPIC GASTRIC SLEEVE RESECTION     parotid gland removal      Social History:  Social History   Socioeconomic History   Marital status: Single    Spouse name: Not on file   Number of children: Not on file   Years of education: Not on file   Highest education level: Not on file  Occupational History   Not on file  Tobacco Use   Smoking status: Former    Packs/day: 0.25    Years: 3.00    Total pack years: 0.75    Types: Cigarettes    Quit date: 03/31/2000    Years since quitting: 21.5   Smokeless tobacco: Never  Vaping Use   Vaping Use: Never used  Substance and Sexual Activity   Alcohol use: Yes    Alcohol/week: 0.0 standard drinks of alcohol    Comment: RARELY   Drug use: No   Sexual activity: Never  Other Topics Concern   Not on file  Social History Narrative   Not on file   Social Determinants of Health   Financial Resource Strain: Not on file  Food Insecurity: Not on file  Transportation Needs: Not on file  Physical Activity: Not on file  Stress: Not on file  Social Connections: Not on file  Intimate Partner Violence: Not on file    Family History:  Family History  Problem Relation Age of Onset   Diabetes Mother    Hepatitis C Mother    Sleep apnea Father    Arthritis Sister    Diabetes Maternal Grandmother    Breast cancer Neg Hx     Medications:   Current Outpatient Medications on File Prior to Visit  Medication Sig Dispense Refill   aspirin EC 81 MG tablet Take 81 mg by mouth daily.      atorvastatin (LIPITOR) 40 MG tablet Take 1 tablet (40 mg total) by mouth daily. 90 tablet 4   Cholecalciferol (VITAMIN D) 50 MCG (2000 UT) tablet Take 2,000 Units by  mouth daily.     diphenhydramine-acetaminophen (TYLENOL PM) 25-500 MG TABS tablet Take 2 tablets by mouth at bedtime as needed (sleep).     estradiol (CLIMARA - DOSED IN MG/24 HR) 0.05 mg/24hr patch Place 0.05 mg onto the skin every Friday.     levothyroxine (SYNTHROID) 150 MCG tablet TAKE 1 TABLET BY MOUTH  EVERY DAY 90 tablet 4   Multiple Vitamin (MULTIVITAMIN WITH MINERALS) TABS tablet Take 1 tablet by mouth daily.     omeprazole (PRILOSEC) 20 MG capsule TAKE 1 CAPSULE BY MOUTH EVERY DAY 90 capsule 4   progesterone (PROMETRIUM) 200 MG capsule Take 200 mg by mouth at bedtime.     sertraline (ZOLOFT) 100 MG tablet Take 1 tablet (100 mg total) by mouth daily. 90 tablet 4   triamcinolone cream (KENALOG) 0.1 % Apply 1 application topically 2 (two) times daily. 30 g 0   No current facility-administered medications on file prior to visit.    Allergies:   Allergies  Allergen Reactions   Lisinopril Cough      OBJECTIVE:  Physical Exam  There were no vitals filed for this visit.  There is no height or weight on file to calculate BMI. No results found.   General: well developed, well nourished, very pleasant middle aged Caucasian female, seated, in no evident distress Head: head normocephalic and atraumatic.   Neck: supple with no carotid or supraclavicular bruits Cardiovascular: regular rate and rhythm, no murmurs Musculoskeletal: no deformity Skin:  no rash/petichiae Vascular:  Normal pulses all extremities   Neurologic Exam Mental Status: Awake and fully alert. Oriented to place and time. Recent and remote memory intact. Attention span, concentration and fund of knowledge appropriate. Mood and affect appropriate.  Cranial Nerves: Pupils equal, briskly reactive to light. Extraocular movements full without nystagmus. Visual fields full to confrontation. Hearing intact. Facial sensation intact. Face, tongue, palate moves normally and symmetrically.  Motor: Normal bulk and tone.  Normal strength in all tested extremity muscles Sensory.: intact to touch , pinprick , position and vibratory sensation.  Coordination: Rapid alternating movements normal in all extremities. Finger-to-nose and heel-to-shin performed accurately bilaterally. Gait and Station: Arises from chair without difficulty. Stance is normal. Gait demonstrates normal stride length and balance without use of AD. Tandem walk and heel toe without difficulty.  Reflexes: 1+ and symmetric. Toes downgoing.         ASSESSMENT/PLAN: Dominique Kerr is a 61 y.o. year old female with severe OSA with total AHI of 63.3/h and nocturnal hypoxemia as per HST on 11/19/2020.  Completed titration study 06/2021 due to continued elevated AHI on AutoPap, she was switched to BiPAP with adequate control of apnea on 20/16.    Severe apnea on BiPAP: excellent compliance although continued elevated AHI. Will increase min pressure from 5 to 7. She will bring machine back in 3-4 weeks to repeat download. If still uncontrolled, will recommend proceeding with titration study.  Continue to follow with DME company for any needed supplies or CPAP related concerns    Follow up in 6 months or call earlier if needed   CC:  PCP: Venita Lick, NP    I spent 23 minutes of face-to-face and non-face-to-face time with patient.  This included previsit chart review, lab review, study review, order entry, electronic health record documentation, patient education regarding diagnosis of sleep apnea with review and discussion of compliance report and answered all other questions to patient's satisfaction   Frann Rider, [facility]  [facility] [address] [place], [place] [address]  Phone [phone] Fax [phone] Note: This document was prepared with digital dictation and possible smart phrase technology. Any transcriptional errors that result from this process are unintentional.

## 2021-10-21 ENCOUNTER — Ambulatory Visit (INDEPENDENT_AMBULATORY_CARE_PROVIDER_SITE_OTHER): Payer: 59 | Admitting: Adult Health

## 2021-10-21 ENCOUNTER — Encounter: Payer: Self-pay | Admitting: Adult Health

## 2021-10-21 VITALS — BP 134/71 | HR 70 | Ht 66.0 in | Wt 214.0 lb

## 2021-10-21 DIAGNOSIS — Z9989 Dependence on other enabling machines and devices: Secondary | ICD-10-CM | POA: Diagnosis not present

## 2021-10-21 DIAGNOSIS — G4730 Sleep apnea, unspecified: Secondary | ICD-10-CM | POA: Diagnosis not present

## 2021-10-21 NOTE — Patient Instructions (Addendum)
Continue nightly use of your BiPAP but try to avoid sleeping on your back as this can be contributing to high amount of remaining apnea  You can try the tennis ball method (placing the ball behind your back which will make sleeping on your back and comfortable) or placing a pillow behind your back  I will repeat a download in 2 weeks and we will keep you updated

## 2021-10-22 ENCOUNTER — Encounter: Payer: BC Managed Care – PPO | Admitting: Dermatology

## 2021-10-29 ENCOUNTER — Ambulatory Visit: Payer: 59 | Admitting: Adult Health

## 2021-11-05 ENCOUNTER — Encounter: Payer: Self-pay | Admitting: Adult Health

## 2021-11-09 ENCOUNTER — Telehealth: Payer: Self-pay | Admitting: Adult Health

## 2021-11-09 NOTE — Telephone Encounter (Signed)
Please call back by end of day if she hasn't returned call

## 2021-11-09 NOTE — Telephone Encounter (Signed)
Repeated download for the past 14 days with patient avoiding supine sleep as prior download at recent in office visit showed elevated AHI. Repeat download over the past 14 days shows significantly improved residual AHI 4.9/h on same pressure settings (prior 32.8/h).  Please advise patient of this improvement and remind importance of continued avoidance of supine sleep. Will continue with current pressure settings at this time. Please schedule patient for 17-monthfollow-up visit.  Thank you.

## 2021-11-09 NOTE — Telephone Encounter (Signed)
Please schedule pt 6 mo FU

## 2021-12-23 ENCOUNTER — Telehealth: Payer: Self-pay | Admitting: Nurse Practitioner

## 2021-12-23 NOTE — Telephone Encounter (Signed)
Pt has lost her thyroid medication and she wanted to see if Jolene could send in another script / I advised pt to check with [facility] for refill / pt will check and call back if needed

## 2022-02-11 NOTE — Progress Notes (Signed)
[facility] [address] [place]. [place] [address] [phone]       OFFICE FOLLOW UP NOTE  Dominique Kerr Date of Birth:  06-18-1960 Medical Record Number:  [id number]   Reason for visit: BiPAP follow-up    SUBJECTIVE:   CHIEF COMPLAINT:  Chief Complaint  Patient presents with   Room 2    Pt is here Alone. Pt states that everything has been going good with CPAP Machine. Pt states no headaches or fatigue.     HPI:   Update 02/16/2022 JM: Patient returns for BiPAP follow-up visit.  Continues to tolerate BiPAP well.  Denies any significant daytime fatigue and sleeps well at night.  Follows closely with DME company and up to date on supplies.  Epworth Sleepiness Scale 5/24 (prior 7/24).        History provided for reference purposes only Update 10/21/2021 JM: Patient returns for compliance visit after recently being switched to BiPAP after titration study on 5/21 which showed improvement of AHI to 6.2 on bilevel pressure of 20/16 but noted supine sleep with worsening AHI and desaturations.   Compliance report over the past 30 days shows excellent usage although elevated AHI at 32.8.  She does typically sleep on her back, even when falling asleep.  She is tolerating her current mask well.  She notes some improvement of daytime fatigue and sleep since starting BiPAP.  No other concerns at this time.  Epworth Sleepiness Scale 7/24 (on CPAP 8/24) Fatigue severity scale 17/63      Update 04/28/2021 JM: Returns for initial CPAP compliance visit.  Completed HST 11/19/2020 which showed severe OSA with total AHI of 63.3/h and O2 nadir of 61% for nearly 1 hour, indicating nocturnal hypoxemia.  Recommended initiation of AutoPap and titration study if indicated in the future.   Review of compliance report as below.  Excellent compliance at 100% although residual AHI 11.7. Currently using silicon full face mask tolerating well. Overall no complaints and  tolerating CPAP well. Does endorse some improvement of daytime fatigue. ESS 8/24 (prior to starting 10/24).           Consult visit 10/09/2020 Dr. Rexene Alberts: Dominique Kerr is a 61 year old right-handed woman with an underlying medical history of reflux disease, hypertension, hyperlipidemia, prediabetes, thyroid disease, arthritis, anemia, allergies, anxiety, and obesity with status post gastric sleeve, who was previously diagnosed with obstructive sleep apnea and placed on CPAP therapy.  I reviewed your office note from 07/08/2020.  She has not used her CPAP machine for the past 5 and half or 6 years.  Prior sleep study results were not available for my review today.  Sleep study testing was in [place].  She estimates that her study was in 2012 or 13.  She had weight loss surgery in 2013 or 2014.  She lost altogether about 50 pounds and gained a little bit of weight back over time.  She benefited from CPAP therapy but had some discomfort with her mask.  She was not able to get supplies in a timely manner.  She used her machine altogether for maybe a year.  Compliance download is not available, since she has not used her machine in years.  Her Epworth sleepiness score is 10 out of 24.  She reports snoring and witnessed apneas, she has been noted to have pauses in her breathing by her best friend.  Her friend also uses a CPAP machine.  Patient's father had sleep apnea and had an older style machine.  Patient is single and lives with her sister.  She has no kids, they have 1 dog in the household.  She does have a TV in her bedroom and has been on at night but turns it off before falling asleep typically.  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  She is in bed typically between 8:30 PM and 9 PM and rise time is typically around 5 AM.  She has occasionally woken up with a headache.  She endorses daytime tiredness.  She has nocturia about once or twice per average night.  She quit  smoking some 20 years ago.  She drinks alcohol occasionally, she drinks caffeine in the form of coffee, typically 2 cups in the mornings.       ROS:   14 system review of systems performed and negative with exception of those listed in HPI  PMH:  Past Medical History:  Diagnosis Date   Allergy    Anemia    YEARS AGO WHILE HAVING PERIODS   Anxiety    Arthritis    Dysplastic nevus 06/15/2006   epigastric area - moderate   GERD (gastroesophageal reflux disease)    HSV (herpes simplex virus) infection    Hyperlipidemia    Hypertension    H/O LOST WEIGHT AND NO MEDS X 6 YRS    Obesity    PMDD (premenstrual dysphoric disorder)    Pre-diabetes    PRIOR TO GASTRIC SLEEVE   Sleep apnea    DOES NOT USE CPAP   Sleep apnea    Thyroid disease     PSH:  Past Surgical History:  Procedure Laterality Date   BLADDER SURGERY     CHOLECYSTECTOMY     KNEE ARTHROSCOPY Left    KNEE ARTHROSCOPY Left 04/09/2020   Procedure: ARTHROSCOPY KNEE;  Surgeon: Dereck Leep, MD;  Location: [facility] ORS;  Service: Orthopedics;  Laterality: Left;   LAPAROSCOPIC GASTRIC SLEEVE RESECTION     parotid gland removal      Social History:  Social History   Socioeconomic History   Marital status: Single    Spouse name: Not on file   Number of children: Not on file   Years of education: Not on file   Highest education level: Not on file  Occupational History   Not on file  Tobacco Use   Smoking status: Former    Packs/day: 0.25    Years: 3.00    Total pack years: 0.75    Types: Cigarettes    Quit date: 03/31/2000    Years since quitting: 21.8   Smokeless tobacco: Never  Vaping Use   Vaping Use: Never used  Substance and Sexual Activity   Alcohol use: Yes    Alcohol/week: 0.0 standard drinks of alcohol    Comment: RARELY   Drug use: No   Sexual activity: Never  Other Topics Concern   Not on file  Social History Narrative   Not on file   Social Determinants of Health   Financial Resource  Strain: Not on file  Food Insecurity: Not on file  Transportation Needs: Not on file  Physical Activity: Not on file  Stress: Not on file  Social Connections: Not on file  Intimate Partner Violence: Not on file    Family History:  Family History  Problem Relation Age of Onset   Diabetes Mother    Hepatitis C Mother    Sleep apnea Father    Arthritis Sister  Diabetes Maternal Grandmother    Breast cancer Neg Hx     Medications:   Current Outpatient Medications on File Prior to Visit  Medication Sig Dispense Refill   aspirin EC 81 MG tablet Take 81 mg by mouth daily.      atorvastatin (LIPITOR) 40 MG tablet Take 1 tablet (40 mg total) by mouth daily. 90 tablet 4   Cholecalciferol (VITAMIN D) 50 MCG (2000 UT) tablet Take 2,000 Units by mouth daily.     diphenhydramine-acetaminophen (TYLENOL PM) 25-500 MG TABS tablet Take 2 tablets by mouth at bedtime as needed (sleep).     estradiol (CLIMARA - DOSED IN MG/24 HR) 0.05 mg/24hr patch Place 0.05 mg onto the skin every Friday.     levothyroxine (SYNTHROID) 150 MCG tablet TAKE 1 TABLET BY MOUTH EVERY DAY 90 tablet 4   Multiple Vitamin (MULTIVITAMIN WITH MINERALS) TABS tablet Take 1 tablet by mouth daily.     omeprazole (PRILOSEC) 20 MG capsule TAKE 1 CAPSULE BY MOUTH EVERY DAY 90 capsule 4   progesterone (PROMETRIUM) 200 MG capsule Take 200 mg by mouth at bedtime.     sertraline (ZOLOFT) 100 MG tablet Take 1 tablet (100 mg total) by mouth daily. 90 tablet 4   triamcinolone cream (KENALOG) 0.1 % Apply 1 application topically 2 (two) times daily. 30 g 0   No current facility-administered medications on file prior to visit.    Allergies:   Allergies  Allergen Reactions   Lisinopril Cough      OBJECTIVE:  Physical Exam  Vitals:   02/16/22 0737  BP: (!) 184/93  Pulse: 66  Weight: 225 lb 6.4 oz (102.2 kg)  Height: '5\' 6"'$  (1.676 m)     Body mass index is 36.38 kg/m. No results found.   General: well developed, well  nourished, very pleasant middle aged Caucasian female, seated, in no evident distress Head: head normocephalic and atraumatic.   Neck: supple with no carotid or supraclavicular bruits Cardiovascular: regular rate and rhythm, no murmurs Musculoskeletal: no deformity Skin:  no rash/petichiae Vascular:  Normal pulses all extremities   Neurologic Exam Mental Status: Awake and fully alert. Oriented to place and time. Recent and remote memory intact. Attention span, concentration and fund of knowledge appropriate. Mood and affect appropriate.  Cranial Nerves: Pupils equal, briskly reactive to light. Extraocular movements full without nystagmus. Visual fields full to confrontation. Hearing intact. Facial sensation intact. Face, tongue, palate moves normally and symmetrically.  Motor: Normal bulk and tone. Normal strength in all tested extremity muscles Sensory.: intact to touch , pinprick , position and vibratory sensation.  Coordination: Rapid alternating movements normal in all extremities. Finger-to-nose and heel-to-shin performed accurately bilaterally. Gait and Station: Arises from chair without difficulty. Stance is normal. Gait demonstrates normal stride length and balance without use of AD. Tandem walk and heel toe without difficulty.  Reflexes: 1+ and symmetric. Toes downgoing.         ASSESSMENT/PLAN: Eduardo Wurth is a 61 y.o. year old female with severe OSA with total AHI of 63.3/h and nocturnal hypoxemia as per HST on 11/19/2020.  Completed titration study 06/2021 due to continued elevated AHI on AutoPap, she was switched to BiPAP with adequate control of apnea on 20/16.    Severe apnea on BiPAP: excellent compliance and adequate residual AHI.  Continue current pressure settings.  Discussed importance of continued nightly usage for optimal benefit and per insurance requirements.  Continue to follow with DME company for any supplies or BiPAP related  concerns    Follow-up in 1  year or call earlier if needed   CC:  PCP: Venita Lick, NP    I spent 16 minutes of face-to-face and non-face-to-face time with patient.  This included previsit chart review, lab review, study review, order entry, electronic health record documentation, patient education regarding above diagnoses and treatment plan and answered all the questions to patient's satisfaction   Frann Rider, [facility]  [facility] [address] [place], [place] [address]  Phone [phone] Fax [phone] Note: This document was prepared with digital dictation and possible smart phrase technology. Any transcriptional errors that result from this process are unintentional.

## 2022-02-15 NOTE — Patient Instructions (Signed)

## 2022-02-16 ENCOUNTER — Ambulatory Visit: Payer: Managed Care, Other (non HMO) | Admitting: Adult Health

## 2022-02-16 ENCOUNTER — Encounter: Payer: Self-pay | Admitting: Adult Health

## 2022-02-16 VITALS — BP 184/93 | HR 66 | Ht 66.0 in | Wt 225.4 lb

## 2022-02-16 DIAGNOSIS — G4730 Sleep apnea, unspecified: Secondary | ICD-10-CM | POA: Diagnosis not present

## 2022-02-16 DIAGNOSIS — Z9989 Dependence on other enabling machines and devices: Secondary | ICD-10-CM

## 2022-02-16 NOTE — Patient Instructions (Signed)
Continue nightly use of BiPAP for adequate sleep apnea management  Continue to follow with your DME company for any needed supplies or CPAP related concerns    Follow-up in 1 year or call earlier if needed

## 2022-02-19 ENCOUNTER — Encounter: Payer: Self-pay | Admitting: Nurse Practitioner

## 2022-02-19 ENCOUNTER — Ambulatory Visit: Payer: Managed Care, Other (non HMO) | Admitting: Nurse Practitioner

## 2022-02-19 VITALS — BP 127/82 | HR 69 | Temp 97.8°F | Ht 65.98 in | Wt 220.8 lb

## 2022-02-19 DIAGNOSIS — R7301 Impaired fasting glucose: Secondary | ICD-10-CM

## 2022-02-19 DIAGNOSIS — E039 Hypothyroidism, unspecified: Secondary | ICD-10-CM

## 2022-02-19 DIAGNOSIS — G4733 Obstructive sleep apnea (adult) (pediatric): Secondary | ICD-10-CM | POA: Diagnosis not present

## 2022-02-19 DIAGNOSIS — F419 Anxiety disorder, unspecified: Secondary | ICD-10-CM

## 2022-02-19 DIAGNOSIS — E7800 Pure hypercholesterolemia, unspecified: Secondary | ICD-10-CM

## 2022-02-19 DIAGNOSIS — Z79890 Hormone replacement therapy (postmenopausal): Secondary | ICD-10-CM

## 2022-02-19 DIAGNOSIS — F32A Depression, unspecified: Secondary | ICD-10-CM

## 2022-02-19 MED ORDER — LEVOTHYROXINE SODIUM 150 MCG PO TABS: 150.0000 ug | ORAL_TABLET | Freq: Every day | ORAL | 4 refills | Status: DC

## 2022-02-19 NOTE — Assessment & Plan Note (Addendum)
Chronic, ongoing.  Continue current medication dose and adjust as needed based on TSH/T4 levels. Labs today: up to date.  Return in 6 months.

## 2022-02-19 NOTE — Assessment & Plan Note (Signed)
Chronic, ongoing, tolerating Atorvastatin at 40 MG daily.  Continue regimen and adjust as needed. Had recent labs with weight management and will bring these to provider for review.

## 2022-02-19 NOTE — Assessment & Plan Note (Signed)
Followed by GYN, continue current regimen as prescribed by them.

## 2022-02-19 NOTE — Assessment & Plan Note (Signed)
Last A1c 6.1%, had recent labs with weight management and will bring these to provider for review.  Continue diet and exercise focus.

## 2022-02-19 NOTE — Progress Notes (Signed)
BP 127/82   Pulse 69   Temp 97.8 F (36.6 C) (Oral)   Ht 5' 5.98" (1.676 m)   Wt 220 lb 12.8 oz (100.2 kg)   LMP  (LMP Unknown)   SpO2 96%   BMI 35.66 kg/m    Subjective:    Patient ID: Dominique Kerr, female    DOB: Oct 27, 1960, 61 y.o.   MRN: [id number]  HPI: Dominique Kerr is a 61 y.o. female  Chief Complaint  Patient presents with   Hypertension   Hyperlipidemia   Mood   Sleep Apnea   HYPOTHYROIDISM Currently taking Levothyroxine 150 MCG daily, labs performed recently with bariatric clinic in [place], she will bring these in.  Currently taking Phentermine via them.  Recent labs in June A1c 6.1%.  Goes to gynecology on 03/04/22 -- continues on Climara and progesterone. Thyroid control status:stable Satisfied with current treatment? yes Medication side effects: no Medication compliance: good compliance Etiology of hypothyroidism: unknown Recent dose adjustment:no Fatigue: no Cold intolerance: no Heat intolerance: no Weight gain: no Weight loss: no Constipation: no Diarrhea/loose stools: no Palpitations: no Lower extremity edema: no Anxiety/depressed mood: no   HYPERLIPIDEMIA Continues on Atorvastatin 40 MG.  Continues to use CPAP daily -- had follow-up with neurology on 02/16/22.  Needs to sleep on the side.   Hyperlipidemia status: good compliance Satisfied with current treatment?  yes Side effects:  no Medication compliance: good compliance Past cholesterol meds: Atorvastatin Supplements: none Aspirin:  yes The 10-year ASCVD risk score (Arnett DK, et al., 2019) is: 2.2%   Values used to calculate the score:     Age: 87 years     Sex: Female     Is Non-Hispanic African American: No     Diabetic: No     Tobacco smoker: No     Systolic Blood Pressure: 989 mmHg     Is BP treated: No     HDL Cholesterol: 82 mg/dL     Total Cholesterol: 197 mg/dL Chest pain:  no Coronary artery disease:  no Family history CAD:  no Family history early  CAD:  no  DEPRESSION Continues on Sertraline 100 MG daily. Mood status: controlled Satisfied with current treatment?: yes Symptom severity: moderate  Duration of current treatment : chronic Side effects: no Medication compliance: good compliance Depressed mood: no Anxious mood: no Anhedonia: no Significant weight loss or gain: no Insomnia: occasional Fatigue: no Feelings of worthlessness or guilt: no Impaired concentration/indecisiveness: no Suicidal ideations: no Hopelessness: no Crying spells: no    02/19/2022    8:24 AM 08/20/2021    8:32 AM 02/19/2021    9:32 AM 05/22/2020    8:13 AM 11/12/2019    8:38 AM  Depression screen PHQ 2/9  Decreased Interest 0 0 0 0 0  Down, Depressed, Hopeless 0 1 0 0 1  PHQ - 2 Score 0 1 0 0 1  Altered sleeping 0 0 0 0 0  Tired, decreased energy 0 1 0 0 1  Change in appetite 1 1 0 0 0  Feeling bad or failure about yourself  0 0 0 0 0  Trouble concentrating 0 0 0 0 0  Moving slowly or fidgety/restless 0 0 0 0 0  Suicidal thoughts 0 0 0 0 0  PHQ-9 Score 1 3 0 0 2  Difficult doing work/chores Not difficult at all Not difficult at all Not difficult at all  Not difficult at all       02/19/2022  8:24 AM 08/20/2021    8:32 AM 02/19/2021    9:32 AM 11/12/2019    8:38 AM  GAD 7 : Generalized Anxiety Score  Nervous, Anxious, on Edge 0 0 1 1  Control/stop worrying 1 0 0 1  Worry too much - different things 1 1 0 1  Trouble relaxing 0 0 0 0  Restless 0 0 0 0  Easily annoyed or irritable 0 1 0 1  Afraid - awful might happen 0 0 0 0  Total GAD 7 Score _0 Anxiety Difficulty Not difficult at all Not difficult at all Not difficult at all Not difficult at all    Relevant past medical, surgical, family and social history reviewed and updated as indicated. Interim medical history since our last visit reviewed. Allergies and medications reviewed and updated.  Review of Systems  Constitutional: Negative.   Respiratory: Negative.     Cardiovascular: Negative.   Gastrointestinal: Negative.   Neurological: Negative.   Psychiatric/Behavioral: Negative.      Per HPI unless specifically indicated above     Objective:    BP 127/82   Pulse 69   Temp 97.8 F (36.6 C) (Oral)   Ht 5' 5.98" (1.676 m)   Wt 220 lb 12.8 oz (100.2 kg)   LMP  (LMP Unknown)   SpO2 96%   BMI 35.66 kg/m   Wt Readings from Last 3 Encounters:  02/19/22 220 lb 12.8 oz (100.2 kg)  02/16/22 225 lb 6.4 oz (102.2 kg)  10/21/21 214 lb (97.1 kg)    Physical Exam Vitals and nursing note reviewed.  Constitutional:      General: She is awake. She is not in acute distress.    Appearance: She is well-developed. She is not ill-appearing.  HENT:     Head: Normocephalic.     Right Ear: Hearing normal.     Left Ear: Hearing normal.  Eyes:     General: Lids are normal.        Right eye: No discharge.        Left eye: No discharge.     Conjunctiva/sclera: Conjunctivae normal.     Pupils: Pupils are equal, round, and reactive to light.  Neck:     Thyroid: No thyromegaly.     Vascular: No carotid bruit.  Cardiovascular:     Rate and Rhythm: Normal rate and regular rhythm.     Heart sounds: Normal heart sounds. No murmur heard.    No gallop.  Pulmonary:     Effort: Pulmonary effort is normal. No accessory muscle usage or respiratory distress.     Breath sounds: Normal breath sounds.  Abdominal:     General: Bowel sounds are normal.     Palpations: Abdomen is soft.  Musculoskeletal:     Cervical back: Normal range of motion and neck supple.     Right lower leg: No edema.     Left lower leg: No edema.  Lymphadenopathy:     Cervical: No cervical adenopathy.  Skin:    General: Skin is warm and dry.  Neurological:     Mental Status: She is alert and oriented to person, place, and time.  Psychiatric:        Attention and Perception: Attention normal.        Mood and Affect: Mood normal.        Speech: Speech normal.        Behavior:  Behavior normal. Behavior is cooperative.  Thought Content: Thought content normal.        Judgment: Judgment normal.     Results for orders placed or performed in visit on 08/20/21  Bayer DCA Hb A1c Waived  Result Value Ref Range   HB A1C (BAYER DCA - WAIVED) 6.1 (H) 4.8 - 5.6 %  Microalbumin, Urine Waived  Result Value Ref Range   Microalb, Ur Waived 10 0 - 19 mg/L   Creatinine, Urine Waived 50 10 - 300 mg/dL   Microalb/Creat Ratio <30 <30 mg/g  CBC with Differential/Platelet  Result Value Ref Range   WBC 4.4 3.4 - 10.8 x10E3/uL   RBC 4.38 3.77 - 5.28 x10E6/uL   Hemoglobin 11.9 11.1 - 15.9 g/dL   Hematocrit 37.0 34.0 - 46.6 %   MCV 85 79 - 97 fL   MCH 27.2 26.6 - 33.0 pg   MCHC 32.2 31.5 - 35.7 g/dL   RDW 13.7 11.7 - 15.4 %   Platelets 274 150 - 450 x10E3/uL   Neutrophils 60 Not Estab. %   Lymphs 26 Not Estab. %   Monocytes 8 Not Estab. %   Eos 4 Not Estab. %   Basos 1 Not Estab. %   Neutrophils Absolute 2.7 1.4 - 7.0 x10E3/uL   Lymphocytes Absolute 1.1 0.7 - 3.1 x10E3/uL   Monocytes Absolute 0.3 0.1 - 0.9 x10E3/uL   EOS (ABSOLUTE) 0.2 0.0 - 0.4 x10E3/uL   Basophils Absolute 0.1 0.0 - 0.2 x10E3/uL   Immature Granulocytes 1 Not Estab. %   Immature Grans (Abs) 0.0 0.0 - 0.1 x10E3/uL  Comprehensive metabolic panel  Result Value Ref Range   Glucose 98 70 - 99 mg/dL   BUN 16 6 - 24 mg/dL   Creatinine, Ser 0.72 0.57 - 1.00 mg/dL   eGFR 97 >59 mL/min/1.73   BUN/Creatinine Ratio 22 9 - 23   Sodium 137 134 - 144 mmol/L   Potassium 4.3 3.5 - 5.2 mmol/L   Chloride 99 96 - 106 mmol/L   CO2 25 20 - 29 mmol/L   Calcium 9.4 8.7 - 10.2 mg/dL   Total Protein 6.9 6.0 - 8.5 g/dL   Albumin 4.4 3.8 - 4.9 g/dL   Globulin, Total 2.5 1.5 - 4.5 g/dL   Albumin/Globulin Ratio 1.8 1.2 - 2.2   Bilirubin Total 0.4 0.0 - 1.2 mg/dL   Alkaline Phosphatase 81 44 - 121 IU/L   AST 32 0 - 40 IU/L   ALT 41 (H) 0 - 32 IU/L  Lipid Panel w/o Chol/HDL Ratio  Result Value Ref Range    Cholesterol, Total 197 100 - 199 mg/dL   Triglycerides 81 0 - 149 mg/dL   HDL 82 >39 mg/dL   VLDL Cholesterol Cal 14 5 - 40 mg/dL   LDL Chol Calc (NIH) 101 (H) 0 - 99 mg/dL  TSH  Result Value Ref Range   TSH 1.270 0.450 - 4.500 uIU/mL  T4, free  Result Value Ref Range   Free T4 1.46 0.82 - 1.77 ng/dL  VITAMIN D 25 Hydroxy (Vit-D Deficiency, Fractures)  Result Value Ref Range   Vit D, 25-Hydroxy 39.4 30.0 - 100.0 ng/mL  Magnesium  Result Value Ref Range   Magnesium 2.2 1.6 - 2.3 mg/dL      Assessment & Plan:   Problem List Items Addressed This Visit       Respiratory   Obstructive sleep apnea of adult    Chronic.  Diagnosed September 2022, she has a CPAP and is using 100% of  the time with improvement in symptoms.  Recommend continuing this.        Endocrine   Adult hypothyroidism    Chronic, ongoing.  Continue current medication dose and adjust as needed based on TSH/T4 levels. Labs today: up to date.  Return in 6 months.      Relevant Medications   levothyroxine (SYNTHROID) 150 MCG tablet   IFG (impaired fasting glucose)    Last A1c 6.1%, had recent labs with weight management and will bring these to provider for review.  Continue diet and exercise focus.        Other   Anxiety and depression - Primary    Chronic, stable.  Denies SI/HI.  Continue current medication regimen and adjust as needed.       HLD (hyperlipidemia)    Chronic, ongoing, tolerating Atorvastatin at 40 MG daily.  Continue regimen and adjust as needed. Had recent labs with weight management and will bring these to provider for review.      Postmenopausal HRT (hormone replacement therapy)    Followed by GYN, continue current regimen as prescribed by them.         Follow up plan: Return in about 6 months (around 08/23/2022) for Annual physical -- after 08/21/22.

## 2022-02-19 NOTE — Assessment & Plan Note (Signed)
Chronic, stable.  Denies SI/HI.  Continue current medication regimen and adjust as needed.

## 2022-02-19 NOTE — Assessment & Plan Note (Signed)
Chronic.  Diagnosed September 2022, she has a CPAP and is using 100% of the time with improvement in symptoms.  Recommend continuing this.

## 2022-03-24 ENCOUNTER — Other Ambulatory Visit: Payer: Self-pay | Admitting: Obstetrics and Gynecology

## 2022-03-24 DIAGNOSIS — Z1231 Encounter for screening mammogram for malignant neoplasm of breast: Secondary | ICD-10-CM

## 2022-04-09 IMAGING — MR MR KNEE*L* W/O CM
7 series · 40 of 40 positions shown · non-contrast
Comparison: None.

CLINICAL DATA: Medial left knee pain over the last 3 weeks.

EXAM:
MRI OF THE LEFT KNEE WITHOUT CONTRAST
TECHNIQUE: Multiplanar, multisequence MR imaging of the knee was performed. No
intravenous contrast was administered.

[Series 8: T2 fat-sat · axial · left · 4.0mm · 0.50mm/px · z∈[-75,+49]mm · 6 of 26 slices shown (1 of 3)]
[im 1/26]
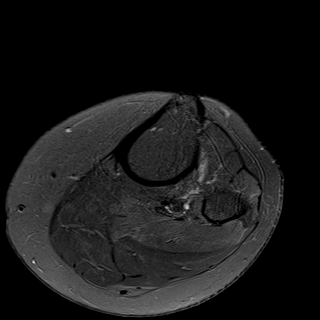
[im 6/26]
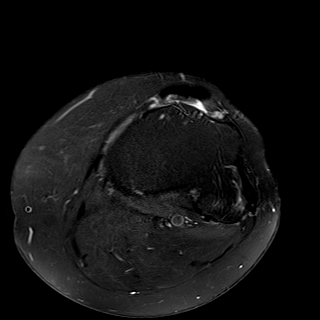
[im 11/26]
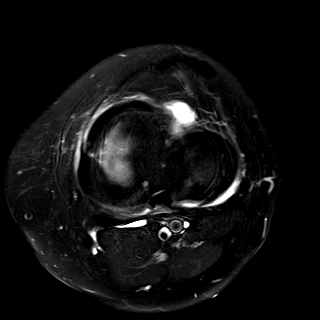
[im 16/26]
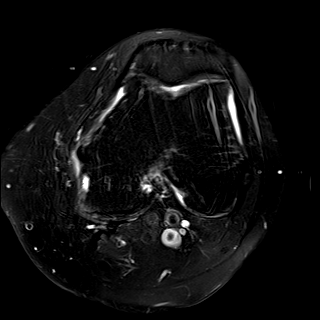
[im 21/26]
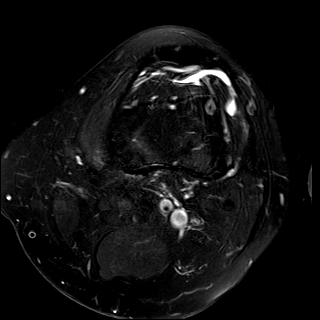
[im 26/26]
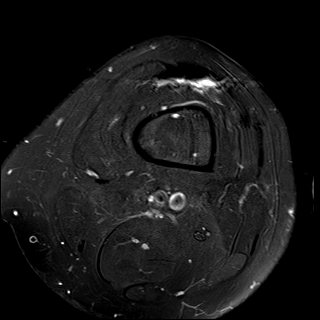

[Series 9: T1 · coronal · left · 4.0mm · 0.39mm/px · 7 of 32 slices shown]
[im 1/32]
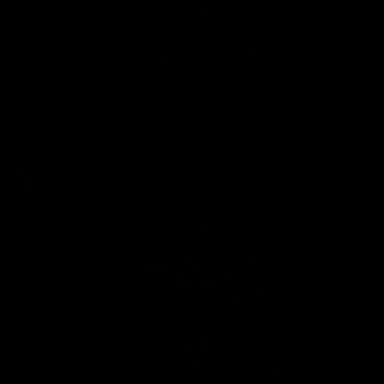
[im 6/32]
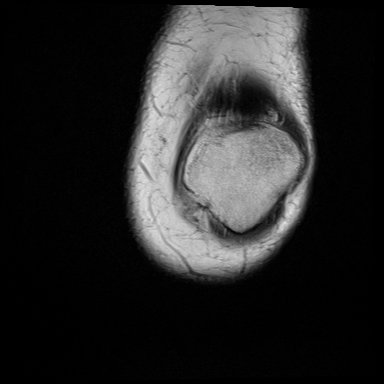
[im 11/32]
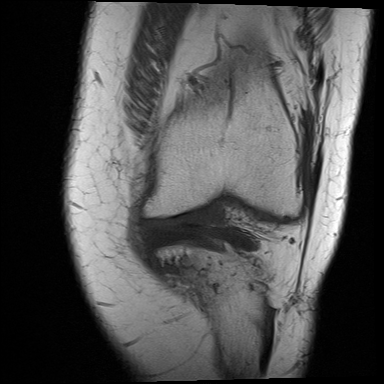
[im 16/32]
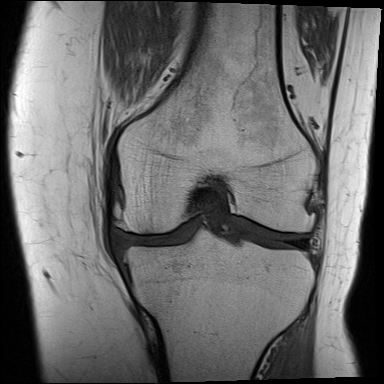
[im 21/32]
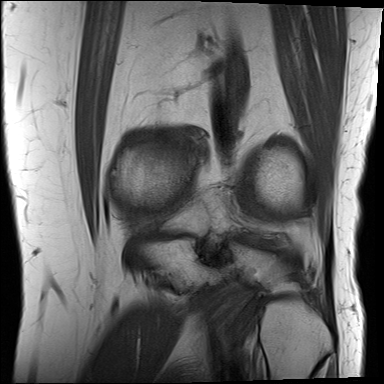
[im 26/32]
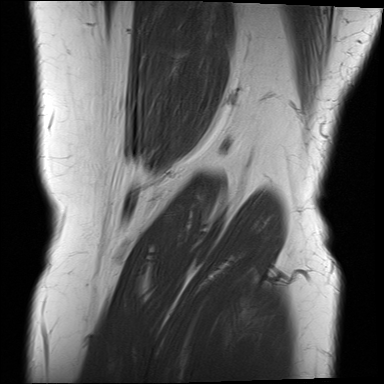
[im 32/32]
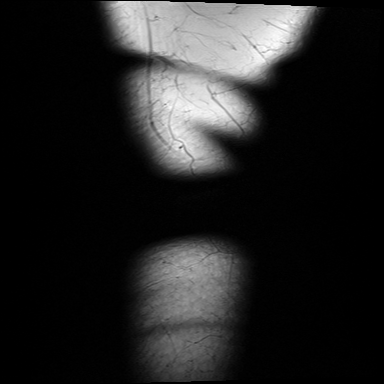

[Series 10: T2 fat-sat · coronal · left · 4.0mm · 0.59mm/px · 6 of 31 slices shown (2 of 3)]
[im 1/31]
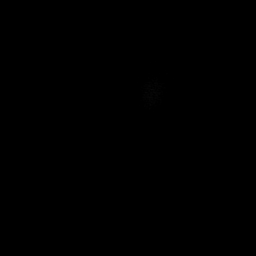
[im 7/31]
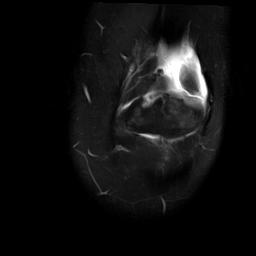
[im 13/31]
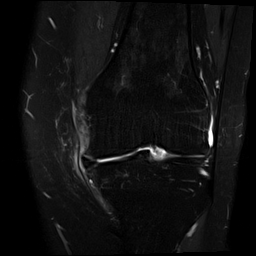
[im 19/31]
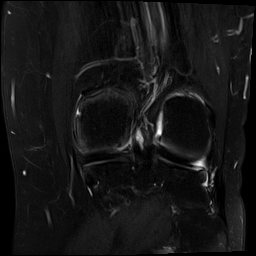
[im 25/31]
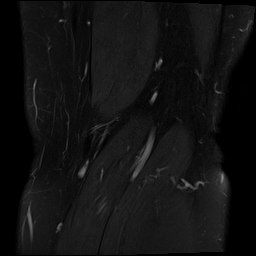
[im 31/31]
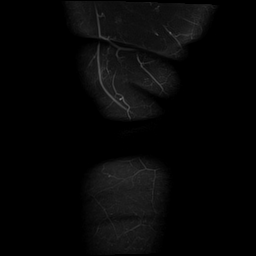

[Series 11: PD fat-sat · coronal · left · 4.0mm · 0.59mm/px · 6 of 31 slices shown (1 of 2)]
[im 1/31]
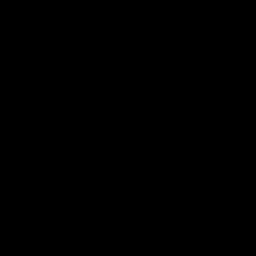
[im 7/31]
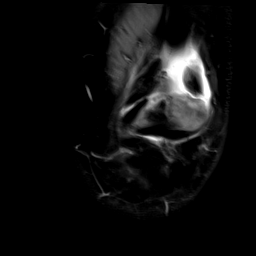
[im 13/31]
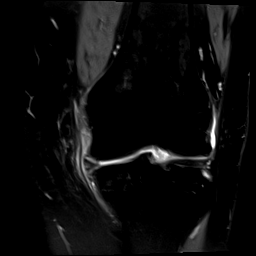
[im 19/31]
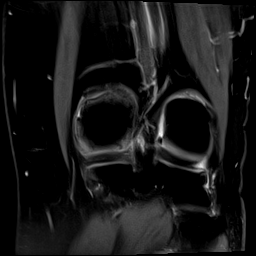
[im 25/31]
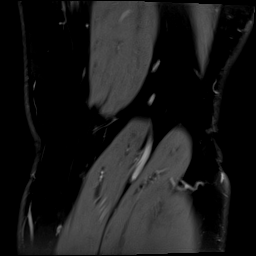
[im 31/31]
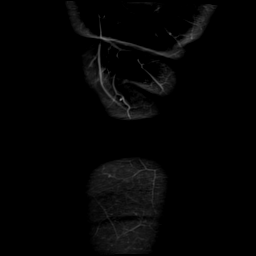

[Series 12: PD fat-sat · sagittal · left · 3.0mm · 0.59mm/px · 6 of 32 slices shown (2 of 2)]
[im 1/32]
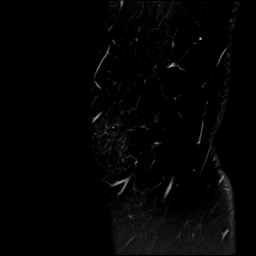
[im 7/32]
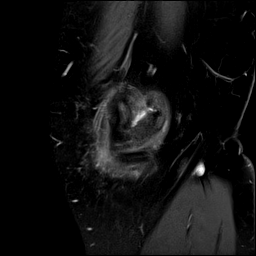
[im 13/32]
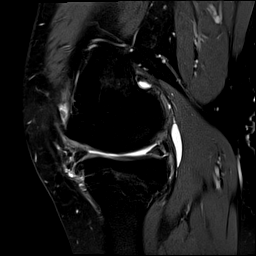
[im 19/32]
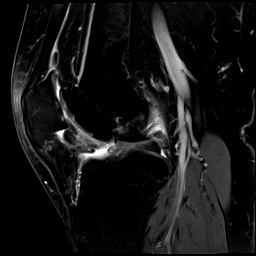
[im 25/32]
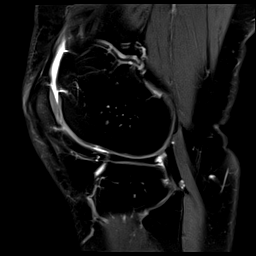
[im 32/32]
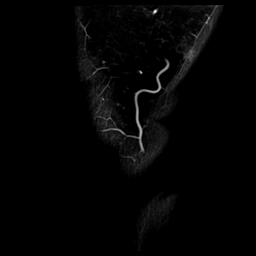

[Series 13: T2 fat-sat · sagittal · left · 3.0mm · 0.59mm/px · 7 of 35 slices shown (3 of 3)]
[im 1/35]
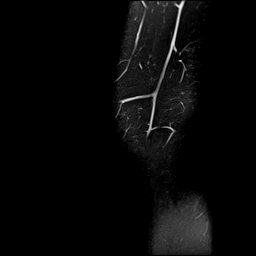
[im 6/35]
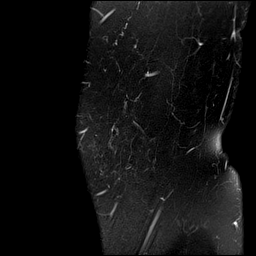
[im 12/35]
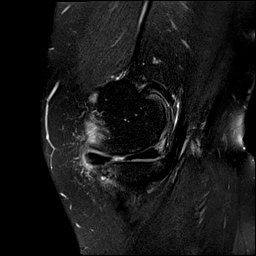
[im 18/35]
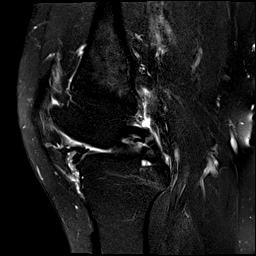
[im 23/35]
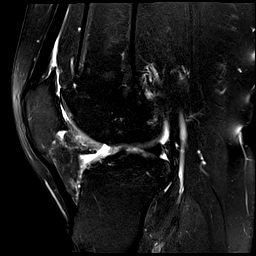
[im 29/35]
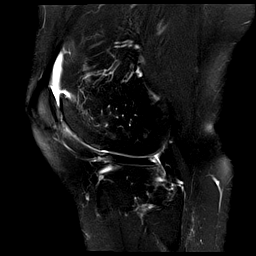
[im 35/35]
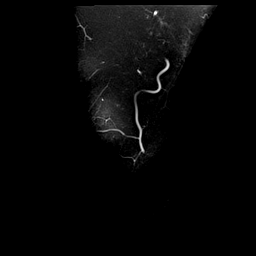

[Series 14: PD · coronal · left · 2.0mm · 0.47mm/px · 2 of 10 slices shown]
[im 1/10]
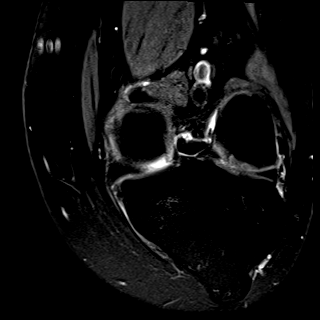
[im 10/10]
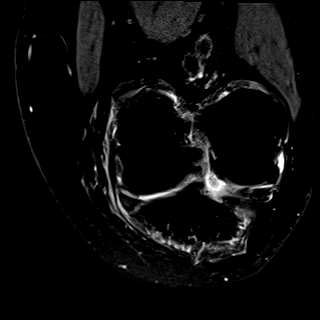

[40 of 40 positions shown; findings below may reference images not displayed]

FINDINGS: Despite efforts by the technologist and patient, motion artifact is
present on today's exam and could not be eliminated. This reduces
exam sensitivity and specificity.

MENISCI

Medial meniscus: Prominent radial tear of the midbody. There is some
adjacent free edge irregularity and inferior surface irregularity in
the anterior horn next to this radial tear.

Lateral meniscus:  No tear, discoid variant.

LIGAMENTS

Cruciates:  Unremarkable

Collaterals: Mild edema tracks adjacent to the MCL. This can be
incidental but in the appropriate clinical circumstance could
represent grade 1 sprain.

CARTILAGE

Patellofemoral: Prominent chondral thinning along substantial
portions of the patella, especially the posterior patellar ridge and
portions of the medial facet. Moderate chondral thinning elsewhere.
Marginal spurring noted.

Medial: Prominent degenerative chondral thinning. Marginal spurring.

Lateral: Chondral heterogeneity compatible with mild chondromalacia.
Marginal spurring.

Joint: Small knee effusion. Mild synovitis along the posterior
margin of Hoffa's fat pad on image 22 of series 13.

Popliteal Fossa:  Small Baker's cyst.

Extensor Mechanism:  Mild proximal patellar tendinopathy.

Bones: No significant extra-articular osseous abnormalities
identified.

Other: No supplemental non-categorized findings.
IMPRESSION: 1. Prominent radial tear of the midbody medial meniscus.
2. Mild edema tracks adjacent to the MCL. This can be incidental but
in the appropriate clinical circumstance could represent grade 1
sprain.
3. Prominent chondral thinning in the patellofemoral joint and
medial compartment with mild chondromalacia in the lateral
compartment.
4. Small knee effusion with mild synovitis along the posterior
margin of Hoffa's fat pad.
5. Small Baker's cyst.
6. Mild proximal patellar tendinopathy.
7. Despite efforts by the technologist and patient, motion artifact
is present on today's exam and could not be eliminated. This reduces
exam sensitivity and specificity.

## 2022-04-15 ENCOUNTER — Ambulatory Visit
Admission: RE | Admit: 2022-04-15 | Discharge: 2022-04-15 | Disposition: A | Discharge status: Home / Self Care | Payer: Managed Care, Other (non HMO) | Source: Ambulatory Visit | Attending: Obstetrics and Gynecology | Admitting: Obstetrics and Gynecology

## 2022-04-15 DIAGNOSIS — Z1231 Encounter for screening mammogram for malignant neoplasm of breast: Secondary | ICD-10-CM | POA: Insufficient documentation

## 2022-08-17 LAB — BASIC METABOLIC PANEL
BUN: 15 (ref 4–21)
CO2: 23 — AB (ref 13–22)
Chloride: 102 (ref 99–108)
Creatinine: 0.7 (ref 0.5–1.1)
Glucose: 78
Potassium: 4.4 mEq/L (ref 3.5–5.1)
Sodium: 140 (ref 137–147)

## 2022-08-17 LAB — HEPATIC FUNCTION PANEL
ALT: 39 U/L — AB (ref 7–35)
AST: 38 — AB (ref 13–35)
Alkaline Phosphatase: 85 (ref 25–125)
Bilirubin, Total: 0.4

## 2022-08-17 LAB — COMPREHENSIVE METABOLIC PANEL: Globulin: 2.3

## 2022-08-17 LAB — CBC: RBC: 4.36 (ref 3.87–5.11)

## 2022-08-17 LAB — HEMOGLOBIN A1C: Hemoglobin A1C: 6.2

## 2022-08-18 ENCOUNTER — Encounter: Payer: Self-pay | Admitting: Nurse Practitioner

## 2022-08-18 LAB — COMPREHENSIVE METABOLIC PANEL
Albumin: 4.4 (ref 3.5–5.0)
Calcium: 8.9 (ref 8.7–10.7)
eGFR: 98

## 2022-08-18 LAB — CBC AND DIFFERENTIAL
HCT: 37 (ref 36–46)
Hemoglobin: 11.8 — AB (ref 12.0–16.0)
Neutrophils Absolute: 2.9
Platelets: 270 10*3/uL (ref 150–400)
WBC: 4.8

## 2022-08-18 LAB — TSH: TSH: 0.29 — AB (ref 0.41–5.90)

## 2022-08-18 LAB — VITAMIN D 25 HYDROXY (VIT D DEFICIENCY, FRACTURES): Vit D, 25-Hydroxy: 38.6

## 2022-08-20 NOTE — Patient Instructions (Signed)
Be Involved in Your Health Care:  Taking Medications When medications are taken as directed, they can greatly improve your health. But if they are not taken as instructed, they may not work. In some cases, not taking them correctly can be harmful. To help ensure your treatment remains effective and safe, understand your medications and how to take them.  Your lab results, notes and after visit summary will be available on My Chart. We strongly encourage you to use this feature. If lab results are abnormal the clinic will contact you with the appropriate steps. If the clinic does not contact you assume the results are satisfactory. You can always see your results on My Chart. If you have questions regarding your condition, please contact the clinic during office hours. You can also ask questions on My Chart.  We at [facility] are grateful that you chose us to provide care. We strive to provide excellent and compassionate care and are always looking for feedback. If you get a survey from the clinic please complete this.   Heart-Healthy Eating Plan Many factors influence your heart health, including eating and exercise habits. Heart health is also called coronary health. Coronary risk increases with abnormal blood fat (lipid) levels. A heart-healthy eating plan includes limiting unhealthy fats, increasing healthy fats, limiting salt (sodium) intake, and making other diet and lifestyle changes. What is my plan? Your health care provider may recommend that: You limit your fat intake to _________% or less of your total calories each day. You limit your saturated fat intake to _________% or less of your total calories each day. You limit the amount of cholesterol in your diet to less than _________ mg per day. You limit the amount of sodium in your diet to less than _________ mg per day. What are tips for following this plan? Cooking Cook foods using methods other than frying. Baking,  boiling, grilling, and broiling are all good options. Other ways to reduce fat include: Removing the skin from poultry. Removing all visible fats from meats. Steaming vegetables in water or broth. Meal planning  At meals, imagine dividing your plate into fourths: Fill one-half of your plate with vegetables and green salads. Fill one-fourth of your plate with whole grains. Fill one-fourth of your plate with lean protein foods. Eat 2-4 cups of vegetables per day. One cup of vegetables equals 1 cup (91 g) broccoli or cauliflower florets, 2 medium carrots, 1 large bell pepper, 1 large sweet potato, 1 large tomato, 1 medium white potato, 2 cups (150 g) raw leafy greens. Eat 1-2 cups of fruit per day. One cup of fruit equals 1 small apple, 1 large banana, 1 cup (237 g) mixed fruit, 1 large orange,  cup (82 g) dried fruit, 1 cup (240 mL) 100% fruit juice. Eat more foods that contain soluble fiber. Examples include apples, broccoli, carrots, beans, peas, and barley. Aim to get 25-30 g of fiber per day. Increase your consumption of legumes, nuts, and seeds to 4-5 servings per week. One serving of dried beans or legumes equals  cup (90 g) cooked, 1 serving of nuts is  oz (12 almonds, 24 pistachios, or 7 walnut halves), and 1 serving of seeds equals  oz (8 g). Fats Choose healthy fats more often. Choose monounsaturated and polyunsaturated fats, such as olive and canola oils, avocado oil, flaxseeds, walnuts, almonds, and seeds. Eat more omega-3 fats. Choose salmon, mackerel, sardines, tuna, flaxseed oil, and ground flaxseeds. Aim to eat fish at least 2   times each week. Check food labels carefully to identify foods with trans fats or high amounts of saturated fat. Limit saturated fats. These are found in animal products, such as meats, butter, and cream. Plant sources of saturated fats include palm oil, palm kernel oil, and coconut oil. Avoid foods with partially hydrogenated oils in them. These contain  trans fats. Examples are stick margarine, some tub margarines, cookies, crackers, and other baked goods. Avoid fried foods. General information Eat more home-cooked food and less restaurant, buffet, and fast food. Limit or avoid alcohol. Limit foods that are high in added sugar and simple starches such as foods made using white refined flour (white breads, pastries, sweets). Lose weight if you are overweight. Losing just 5-10% of your body weight can help your overall health and prevent diseases such as diabetes and heart disease. Monitor your sodium intake, especially if you have high blood pressure. Talk with your health care provider about your sodium intake. Try to incorporate more vegetarian meals weekly. What foods should I eat? Fruits All fresh, canned (in natural juice), or frozen fruits. Vegetables Fresh or frozen vegetables (raw, steamed, roasted, or grilled). Green salads. Grains Most grains. Choose whole wheat and whole grains most of the time. Rice and pasta, including brown rice and pastas made with whole wheat. Meats and other proteins Lean, well-trimmed beef, veal, pork, and lamb. Chicken and turkey without skin. All fish and shellfish. Wild duck, rabbit, pheasant, and venison. Egg whites or low-cholesterol egg substitutes. Dried beans, peas, lentils, and tofu. Seeds and most nuts. Dairy Low-fat or nonfat cheeses, including ricotta and mozzarella. Skim or 1% milk (liquid, powdered, or evaporated). Buttermilk made with low-fat milk. Nonfat or low-fat yogurt. Fats and oils Non-hydrogenated (trans-free) margarines. Vegetable oils, including soybean, sesame, sunflower, olive, avocado, peanut, safflower, corn, canola, and cottonseed. Salad dressings or mayonnaise made with a vegetable oil. Beverages Water (mineral or sparkling). Coffee and tea. Unsweetened ice tea. Diet beverages. Sweets and desserts Sherbet, gelatin, and fruit ice. Small amounts of dark chocolate. Limit all  sweets and desserts. Seasonings and condiments All seasonings and condiments. The items listed above may not be a complete list of foods and beverages you can eat. Contact a dietitian for more options. What foods should I avoid? Fruits Canned fruit in heavy syrup. Fruit in cream or butter sauce. Fried fruit. Limit coconut. Vegetables Vegetables cooked in cheese, cream, or butter sauce. Fried vegetables. Grains Breads made with saturated or trans fats, oils, or whole milk. Croissants. Sweet rolls. Donuts. High-fat crackers, such as cheese crackers and chips. Meats and other proteins Fatty meats, such as hot dogs, ribs, sausage, bacon, rib-eye roast or steak. High-fat deli meats, such as salami and bologna. Caviar. Domestic duck and goose. Organ meats, such as liver. Dairy Cream, sour cream, cream cheese, and creamed cottage cheese. Whole-milk cheeses. Whole or 2% milk (liquid, evaporated, or condensed). Whole buttermilk. Cream sauce or high-fat cheese sauce. Whole-milk yogurt. Fats and oils Meat fat, or shortening. Cocoa butter, hydrogenated oils, palm oil, coconut oil, palm kernel oil. Solid fats and shortenings, including bacon fat, salt pork, lard, and butter. Nondairy cream substitutes. Salad dressings with cheese or sour cream. Beverages Regular sodas and any drinks with added sugar. Sweets and desserts Frosting. Pudding. Cookies. Cakes. Pies. Milk chocolate or white chocolate. Buttered syrups. Full-fat ice cream or ice cream drinks. The items listed above may not be a complete list of foods and beverages to avoid. Contact a dietitian for more information. Summary Heart-healthy meal   planning includes limiting unhealthy fats, increasing healthy fats, limiting salt (sodium) intake and making other diet and lifestyle changes. Lose weight if you are overweight. Losing just 5-10% of your body weight can help your overall health and prevent diseases such as diabetes and heart disease. Focus on  eating a balance of foods, including fruits and vegetables, low-fat or nonfat dairy, lean protein, nuts and legumes, whole grains, and heart-healthy oils and fats. This information is not intended to replace advice given to you by your health care provider. Make sure you discuss any questions you have with your health care provider. Document Revised: 03/16/2021 Document Reviewed: 03/16/2021 Elsevier Patient Education  2024 [employer]  

## 2022-08-23 ENCOUNTER — Ambulatory Visit (INDEPENDENT_AMBULATORY_CARE_PROVIDER_SITE_OTHER): Payer: Managed Care, Other (non HMO) | Admitting: Nurse Practitioner

## 2022-08-23 ENCOUNTER — Encounter: Payer: Self-pay | Admitting: Nurse Practitioner

## 2022-08-23 VITALS — BP 125/78 | HR 64 | Temp 98.8°F | Ht 66.0 in | Wt 212.2 lb

## 2022-08-23 DIAGNOSIS — E559 Vitamin D deficiency, unspecified: Secondary | ICD-10-CM

## 2022-08-23 DIAGNOSIS — Z79890 Hormone replacement therapy (postmenopausal): Secondary | ICD-10-CM

## 2022-08-23 DIAGNOSIS — Z0000 Encounter for general adult medical examination without abnormal findings: Secondary | ICD-10-CM

## 2022-08-23 DIAGNOSIS — K219 Gastro-esophageal reflux disease without esophagitis: Secondary | ICD-10-CM

## 2022-08-23 DIAGNOSIS — R7301 Impaired fasting glucose: Secondary | ICD-10-CM

## 2022-08-23 DIAGNOSIS — G4733 Obstructive sleep apnea (adult) (pediatric): Secondary | ICD-10-CM | POA: Diagnosis not present

## 2022-08-23 DIAGNOSIS — F32A Depression, unspecified: Secondary | ICD-10-CM

## 2022-08-23 DIAGNOSIS — E7800 Pure hypercholesterolemia, unspecified: Secondary | ICD-10-CM | POA: Diagnosis not present

## 2022-08-23 DIAGNOSIS — E039 Hypothyroidism, unspecified: Secondary | ICD-10-CM | POA: Diagnosis not present

## 2022-08-23 DIAGNOSIS — F419 Anxiety disorder, unspecified: Secondary | ICD-10-CM | POA: Diagnosis not present

## 2022-08-23 MED ORDER — LEVOTHYROXINE SODIUM 125 MCG PO TABS
125.0000 ug | ORAL_TABLET | Freq: Every day | ORAL | 3 refills | Status: AC
Start: 2022-08-23 — End: ?

## 2022-08-23 MED ORDER — CELECOXIB 200 MG PO CAPS: 200.0000 mg | ORAL_CAPSULE | Freq: Every day | ORAL | 12 refills | Status: DC | PRN

## 2022-08-23 MED ORDER — OMEPRAZOLE 20 MG PO CPDR: DELAYED_RELEASE_CAPSULE | ORAL | 4 refills | Status: DC

## 2022-08-23 MED ORDER — ATORVASTATIN CALCIUM 40 MG PO TABS: 40.0000 mg | ORAL_TABLET | Freq: Every day | ORAL | 4 refills | Status: DC

## 2022-08-23 MED ORDER — SERTRALINE HCL 100 MG PO TABS: 100.0000 mg | ORAL_TABLET | Freq: Every day | ORAL | 4 refills | Status: DC

## 2022-08-23 NOTE — Progress Notes (Signed)
BP 125/78   Pulse 64   Temp 98.8 F (37.1 C) (Oral)   Ht 5\' 6"  (1.676 m)   Wt 212 lb 3.2 oz (96.3 kg)   LMP  (LMP Unknown)   SpO2 98%   BMI 34.25 kg/m    Subjective:    Patient ID: Dominique Kerr, female    DOB: [birth year], 62 y.o.   MRN: [id number]  HPI: Dominique Kerr is a 62 y.o. female presenting on 08/23/2022 for comprehensive medical examination. Current medical complaints include:none  She currently lives with: sister Menopausal Symptoms: no  Continues to followed by GYN who fills her hormone treatment.  HYPERLIPIDEMIA Continues on Atorvastatin 40 MG daily.    Was diagnosed with severe OSA in May 2023 and is using BiPAP every day. Hyperlipidemia status: stable Satisfied with current treatment?  no Side effects:  no Medication compliance: good compliance Supplements: none Aspirin:  yes The 10-year ASCVD risk score (Arnett DK, et al., 2019) is: 2.1%   Values used to calculate the score:     Age: 75 years     Sex: Female     Is Non-Hispanic African American: No     Diabetic: No     Tobacco smoker: No     Systolic Blood Pressure: 125 mmHg     Is BP treated: No     HDL Cholesterol: 82 mg/dL     Total Cholesterol: 197 mg/dL Chest pain:  no Coronary artery disease:  no Family history CAD:  no Family history early CAD:  no   PREDIABETES Recent A1c on labs at work 6.2%.  Continues on Omeprazole daily for GERD. Polydipsia/polyuria: no Visual disturbance: no Chest pain: no Paresthesias: no  HYPOTHYROIDISM Continues on Levothyroxine 150 MCG daily. Thyroid control status:controlled Satisfied with current treatment? yes Medication side effects: no Medication compliance: excellent compliance Etiology of hypothyroidism: unknown Recent dose adjustment:no Fatigue: no Cold intolerance: no Heat intolerance: no Weight gain: no Weight loss: no Constipation: yes Diarrhea/loose stools: no Palpitations: no Lower extremity edema: no Anxiety/depressed  mood: no   DEPRESSION Continues on Sertraline 100 MG daily. Mood status: stable Satisfied with current treatment?: yes Symptom severity: mild  Duration of current treatment : years Side effects: no Medication compliance: excellent compliance Psychotherapy/counseling: yes, in the past. Depressed mood: no Anxious mood: yes related to work Anhedonia: no Significant weight loss or gain: no Insomnia: no  Fatigue: no Feelings of worthlessness or guilt: no Impaired concentration/indecisiveness: no Suicidal ideations: no Hopelessness: no Crying spells: no    08/23/2022    8:14 AM 02/19/2022    8:24 AM 08/20/2021    8:32 AM 02/19/2021    9:32 AM 05/22/2020    8:13 AM  Depression screen PHQ 2/9  Decreased Interest 0 0 0 0 0  Down, Depressed, Hopeless 0 0 1 0 0  PHQ - 2 Score 0 0 1 0 0  Altered sleeping 1 0 0 0 0  Tired, decreased energy 0 0 1 0 0  Change in appetite 1 1 1  0 0  Feeling bad or failure about yourself  0 0 0 0 0  Trouble concentrating 0 0 0 0 0  Moving slowly or fidgety/restless 0 0 0 0 0  Suicidal thoughts 0 0 0 0 0  PHQ-9 Score 2 1 3  0 0  Difficult doing work/chores  Not difficult at all Not difficult at all Not difficult at all        08/23/2022    8:14 AM 02/19/2022  8:24 AM 08/20/2021    8:32 AM 02/19/2021    9:32 AM  GAD 7 : Generalized Anxiety Score  Nervous, Anxious, on Edge 0 0 0 1  Control/stop worrying 0 1 0 0  Worry too much - different things 0 1 1 0  Trouble relaxing 0 0 0 0  Restless 0 0 0 0  Easily annoyed or irritable 0 0 1 0  Afraid - awful might happen 0 0 0 0  Total GAD 7 Score 0 2 2 1   Anxiety Difficulty Not difficult at all Not difficult at all Not difficult at all Not difficult at all        11/03/2018    8:26 AM 08/20/2021    8:31 AM 02/19/2022    8:14 AM 08/23/2022    8:13 AM  Fall Risk  Falls in the past year? 0 0 0 0  Number of falls in past year - Comments  Simultaneous filing. User may not have seen previous data.    Was  there an injury with Fall? 0 0 0 0  Was there an injury with Fall? - Comments  Simultaneous filing. User may not have seen previous data.    Fall Risk Category Calculator 0 0 0 0  Fall Risk Category Calculator - Comments  Simultaneous filing. User may not have seen previous data.    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 User may not have seen previous data.    (RETIRED) Patient Fall Risk Level Low fall risk Low fall risk    (RETIRED) Patient Fall Risk Level - Comments  Simultaneous filing. User may not have seen previous data.    Patient at Risk for Falls Due to  No Fall Risks No Fall Risks No Fall Risks  Patient at Risk for Falls Due to - Comments  Simultaneous filing. User may not have seen previous data.    Fall risk Follow up Falls evaluation completed Falls prevention discussed Falls evaluation completed Falls evaluation completed  Fall risk Follow up - Comments  Simultaneous filing. User may not have seen previous data.      Functional Status Survey: Is the patient deaf or have difficulty hearing?: No Does the patient have difficulty seeing, even when wearing glasses/contacts?: No Does the patient have difficulty concentrating, remembering, or making decisions?: No Does the patient have difficulty walking or climbing stairs?: No Does the patient have difficulty dressing or bathing?: No Does the patient have difficulty doing errands alone such as visiting a doctor's office or shopping?: No    Past Medical History:  Past Medical History:  Diagnosis Date   Allergy    Anemia    YEARS AGO WHILE HAVING PERIODS   Anxiety    Arthritis    Dysplastic nevus 06/15/2006   epigastric area - moderate   GERD (gastroesophageal reflux disease)    HSV (herpes simplex virus) infection    Hyperlipidemia    Hypertension    H/O LOST WEIGHT AND NO MEDS X 6 YRS    Obesity    PMDD (premenstrual dysphoric disorder)    Pre-diabetes     PRIOR TO GASTRIC SLEEVE   Sleep apnea    DOES NOT USE CPAP   Sleep apnea    Thyroid disease     Surgical History:  Past Surgical History:  Procedure Laterality Date   BLADDER SURGERY     CHOLECYSTECTOMY     KNEE ARTHROSCOPY Left  KNEE ARTHROSCOPY Left 04/09/2020   Procedure: ARTHROSCOPY KNEE;  Surgeon: Donato Heinz, MD;  Location: [facility] ORS;  Service: Orthopedics;  Laterality: Left;   LAPAROSCOPIC GASTRIC SLEEVE RESECTION     parotid gland removal      Medications:  Current Outpatient Medications on File Prior to Visit  Medication Sig   aspirin EC 81 MG tablet Take 81 mg by mouth daily.    Cholecalciferol (VITAMIN D) 50 MCG (2000 UT) tablet Take 2,000 Units by mouth daily.   diphenhydramine-acetaminophen (TYLENOL PM) 25-500 MG TABS tablet Take 2 tablets by mouth at bedtime as needed (sleep).   estradiol (CLIMARA - DOSED IN MG/24 HR) 0.05 mg/24hr patch Place 0.05 mg onto the skin every Friday.   Multiple Vitamin (MULTIVITAMIN WITH MINERALS) TABS tablet Take 1 tablet by mouth daily.   progesterone (PROMETRIUM) 200 MG capsule Take 200 mg by mouth at bedtime.   triamcinolone cream (KENALOG) 0.1 % Apply 1 application topically 2 (two) times daily.   No current facility-administered medications on file prior to visit.    Allergies:  Allergies  Allergen Reactions   Lisinopril Cough    Social History:  Social History   Socioeconomic History   Marital status: Single    Spouse name: Not on file   Number of children: Not on file   Years of education: Not on file   Highest education level: Not on file  Occupational History   Not on file  Tobacco Use   Smoking status: Former    Packs/day: 0.25    Years: 3.00    Additional pack years: 0.00    Total pack years: 0.75    Types: Cigarettes    Quit date: 03/31/2000    Years since quitting: 22.4   Smokeless tobacco: Never  Vaping Use   Vaping Use: Never used  Substance and Sexual Activity   Alcohol use: Yes     Alcohol/week: 0.0 standard drinks of alcohol    Comment: RARELY   Drug use: No   Sexual activity: Never  Other Topics Concern   Not on file  Social History Narrative   Not on file   Social Determinants of Health   Financial Resource Strain: Not on file  Food Insecurity: Not on file  Transportation Needs: Not on file  Physical Activity: Not on file  Stress: Not on file  Social Connections: Not on file  Intimate Partner Violence: Not on file   Social History   Tobacco Use  Smoking Status Former   Packs/day: 0.25   Years: 3.00   Additional pack years: 0.00   Total pack years: 0.75   Types: Cigarettes   Quit date: 03/31/2000   Years since quitting: 22.4  Smokeless Tobacco Never   Social History   Substance and Sexual Activity  Alcohol Use Yes   Alcohol/week: 0.0 standard drinks of alcohol   Comment: RARELY    Family History:  Family History  Problem Relation Age of Onset   Diabetes Mother    Hepatitis C Mother    Sleep apnea Father    Arthritis Sister    Diabetes Maternal Grandmother    Breast cancer Neg Hx     Past medical history, surgical history, medications, allergies, family history and social history reviewed with patient today and changes made to appropriate areas of the chart.   ROS All other ROS negative except what is listed above and in the HPI.      Objective:    BP 125/78  Pulse 64   Temp 98.8 F (37.1 C) (Oral)   Ht 5\' 6"  (1.676 m)   Wt 212 lb 3.2 oz (96.3 kg)   LMP  (LMP Unknown)   SpO2 98%   BMI 34.25 kg/m   Wt Readings from Last 3 Encounters:  08/23/22 212 lb 3.2 oz (96.3 kg)  02/19/22 220 lb 12.8 oz (100.2 kg)  02/16/22 225 lb 6.4 oz (102.2 kg)    Physical Exam Vitals and nursing note reviewed. Exam conducted with a chaperone present.  Constitutional:      General: She is awake. She is not in acute distress.    Appearance: She is well-developed and well-groomed. She is obese. She is not ill-appearing or toxic-appearing.   HENT:     Head: Normocephalic and atraumatic.     Right Ear: Hearing, tympanic membrane, ear canal and external ear normal. No drainage.     Left Ear: Hearing, tympanic membrane, ear canal and external ear normal. No drainage.     Nose: Nose normal.     Right Sinus: No maxillary sinus tenderness or frontal sinus tenderness.     Left Sinus: No maxillary sinus tenderness or frontal sinus tenderness.     Mouth/Throat:     Mouth: Mucous membranes are moist.     Pharynx: Oropharynx is clear. Uvula midline. No pharyngeal swelling, oropharyngeal exudate or posterior oropharyngeal erythema.  Eyes:     General: Lids are normal.        Right eye: No discharge.        Left eye: No discharge.     Extraocular Movements: Extraocular movements intact.     Conjunctiva/sclera: Conjunctivae normal.     Pupils: Pupils are equal, round, and reactive to light.     Visual Fields: Right eye visual fields normal and left eye visual fields normal.  Neck:     Thyroid: No thyromegaly.     Vascular: No carotid bruit.     Trachea: Trachea normal.  Cardiovascular:     Rate and Rhythm: Normal rate and regular rhythm.     Heart sounds: Normal heart sounds. No murmur heard.    No gallop.  Pulmonary:     Effort: Pulmonary effort is normal. No accessory muscle usage or respiratory distress.     Breath sounds: Normal breath sounds.  Chest:  Breasts:    Right: Normal.     Left: Normal.  Abdominal:     General: Bowel sounds are normal.     Palpations: Abdomen is soft. There is no hepatomegaly or splenomegaly.     Tenderness: There is no abdominal tenderness.  Musculoskeletal:        General: Normal range of motion.     Cervical back: Normal range of motion and neck supple.     Right lower leg: No edema.     Left lower leg: No edema.  Lymphadenopathy:     Head:     Right side of head: No submental, submandibular, tonsillar, preauricular or posterior auricular adenopathy.     Left side of head: No submental,  submandibular, tonsillar, preauricular or posterior auricular adenopathy.     Cervical: No cervical adenopathy.     Upper Body:     Right upper body: No supraclavicular, axillary or pectoral adenopathy.     Left upper body: No supraclavicular, axillary or pectoral adenopathy.  Skin:    General: Skin is warm and dry.     Capillary Refill: Capillary refill takes less than 2 seconds.  Findings: No rash.  Neurological:     Mental Status: She is alert and oriented to person, place, and time.     Gait: Gait is intact.     Deep Tendon Reflexes: Reflexes are normal and symmetric.     Reflex Scores:      Brachioradialis reflexes are 2+ on the right side and 2+ on the left side.      Patellar reflexes are 2+ on the right side and 2+ on the left side. Psychiatric:        Attention and Perception: Attention normal.        Mood and Affect: Mood normal.        Speech: Speech normal.        Behavior: Behavior normal. Behavior is cooperative.        Thought Content: Thought content normal.        Judgment: Judgment normal.    Results for orders placed or performed in visit on 08/18/22  CBC and differential  Result Value Ref Range   Hemoglobin 11.8 (A) 12.0 - 16.0   HCT 37 36 - 46   Neutrophils Absolute 2.90    Platelets 270 150 - 400 K/uL   WBC 4.8   CBC  Result Value Ref Range   RBC 4.36 3.87 - 5.11  VITAMIN D 25 Hydroxy (Vit-D Deficiency, Fractures)  Result Value Ref Range   Vit D, 25-Hydroxy 38.6   Basic metabolic panel  Result Value Ref Range   Glucose 78    BUN 15 4 - 21   CO2 23 (A) 13 - 22   Creatinine 0.7 0.5 - 1.1   Potassium 4.4 3.5 - 5.1 mEq/L   Sodium 140 137 - 147   Chloride 102 99 - 108  Comprehensive metabolic panel  Result Value Ref Range   Globulin 2.3    eGFR 98    Calcium 8.9 8.7 - 10.7   Albumin 4.4 3.5 - 5.0  Hepatic function panel  Result Value Ref Range   Alkaline Phosphatase 85 25 - 125   ALT 39 (A) 7 - 35 U/L   AST 38 (A) 13 - 35   Bilirubin,  Total 0.4   Hemoglobin A1c  Result Value Ref Range   Hemoglobin A1C 6.2   TSH  Result Value Ref Range   TSH 0.29 (A) 0.41 - 5.90      Assessment & Plan:   Problem List Items Addressed This Visit       Respiratory   Obstructive sleep apnea of adult    Chronic, ongoing.  Diagnosed with severe OSA in May 2023.  Continue to use BiPAP nightly, she uses 100% of the time per report.        Digestive   GERD (gastroesophageal reflux disease)    Chronic, ongoing.  Continue Prilosec daily and recommend trial of a slow reduction off, if symptoms return then restart.  Check Mag level up to date. Risks of PPI use were discussed with patient including bone loss, C. Diff diarrhea, pneumonia, infections, CKD, electrolyte abnormalities.  Verbalizes understanding and chooses to continue the medication.       Relevant Medications   omeprazole (PRILOSEC) 20 MG capsule     Endocrine   Adult hypothyroidism    Chronic, ongoing.  Recent labs more hyperthyroid, will reduce Levothyroxine to 125 MCG and adjust further as needed.  Plan on recheck of labs outpatient in 6 weeks.      Relevant Medications   levothyroxine (SYNTHROID)  125 MCG tablet   Other Relevant Orders   T4, free   TSH   IFG (impaired fasting glucose)    Recent A1c 6.2%, slight trend up.  Recommend heavy focus on diet and regular exercise. She has lost 8 pounds since last visit.        Other   Anxiety and depression    Chronic, stable.  Denies SI/HI.  Continue current medication regimen and adjust as needed.       Relevant Medications   sertraline (ZOLOFT) 100 MG tablet   HLD (hyperlipidemia) - Primary    Chronic, ongoing, tolerating Atorvastatin at 40 MG daily.  Continue regimen and adjust as needed. Lipid panel fasting today.      Relevant Medications   atorvastatin (LIPITOR) 40 MG tablet   Other Relevant Orders   Lipid Panel w/o Chol/HDL Ratio   Postmenopausal HRT (hormone replacement therapy)    Followed by GYN,  continue current regimen as prescribed by them.      Vitamin D deficiency    Ongoing, continue supplement and level on labs is up to date.  Plan on DEXA at age 32, discussed with patient.      Other Visit Diagnoses     Encounter for annual physical exam       Annual physical today with labs and health maintenance reviewed, discussed with patient.        Follow up plan: Return in about 6 months (around 02/23/2023) for MOOD, PREDIABETES, and THYROID -- also needs lab only visit in 6 weeks please.   LABORATORY TESTING:  - Pap smear: up to date  IMMUNIZATIONS:   - Tdap: Tetanus vaccination status reviewed: last tetanus booster within 10 years. - Influenza: Up to date - Pneumovax: Not applicable - Prevnar: Not applicable - COVID: Up to date - HPV: Not applicable - Shingrix vaccine: Up To Date  SCREENING: -Mammogram: Up to date  - Colonoscopy: Up to date  - Bone Density: Not applicable  -Hearing Test: Not applicable  -Spirometry: Not applicable   PATIENT COUNSELING:   Advised to take 1 mg of folate supplement per day if capable of pregnancy.   Sexuality: Discussed sexually transmitted diseases, partner selection, use of condoms, avoidance of unintended pregnancy  and contraceptive alternatives.   Advised to avoid cigarette smoking.  I discussed with the patient that most people either abstain from alcohol or drink within safe limits (<=14/week and <=4 drinks/occasion for males, <=7/weeks and <= 3 drinks/occasion for females) and that the risk for alcohol disorders and other health effects rises proportionally with the number of drinks per week and how often a drinker exceeds daily limits.  Discussed cessation/primary prevention of drug use and availability of treatment for abuse.   Diet: Encouraged to adjust caloric intake to maintain  or achieve ideal body weight, to reduce intake of dietary saturated fat and total fat, to limit sodium intake by avoiding high sodium foods  and not adding table salt, and to maintain adequate dietary potassium and calcium preferably from fresh fruits, vegetables, and low-fat dairy products.    Stressed the importance of regular exercise  Injury prevention: Discussed safety belts, safety helmets, smoke detector, smoking near bedding or upholstery.   Dental health: Discussed importance of regular tooth brushing, flossing, and dental visits.    NEXT PREVENTATIVE PHYSICAL DUE IN 1 YEAR. Return in about 6 months (around 02/23/2023) for MOOD, PREDIABETES, and THYROID -- also needs lab only visit in 6 weeks please.

## 2022-08-23 NOTE — Assessment & Plan Note (Signed)
Chronic, ongoing, tolerating Atorvastatin at 40 MG daily.  Continue regimen and adjust as needed. Lipid panel fasting today.

## 2022-08-23 NOTE — Assessment & Plan Note (Signed)
Ongoing, continue supplement and level on labs is up to date.  Plan on DEXA at age 62, discussed with patient.

## 2022-08-23 NOTE — Assessment & Plan Note (Signed)
Chronic, ongoing.  Recent labs more hyperthyroid, will reduce Levothyroxine to 125 MCG and adjust further as needed.  Plan on recheck of labs outpatient in 6 weeks.

## 2022-08-23 NOTE — Assessment & Plan Note (Signed)
Chronic, stable.  Denies SI/HI.  Continue current medication regimen and adjust as needed. 

## 2022-08-23 NOTE — Assessment & Plan Note (Signed)
Chronic, ongoing.  Continue Prilosec daily and recommend trial of a slow reduction off, if symptoms return then restart.  Check Mag level up to date. Risks of PPI use were discussed with patient including bone loss, C. Diff diarrhea, pneumonia, infections, CKD, electrolyte abnormalities.  Verbalizes understanding and chooses to continue the medication.

## 2022-08-23 NOTE — Assessment & Plan Note (Signed)
Recent A1c 6.2%, slight trend up.  Recommend heavy focus on diet and regular exercise. She has lost 8 pounds since last visit.

## 2022-08-23 NOTE — Assessment & Plan Note (Signed)
Followed by GYN, continue current regimen as prescribed by them. 

## 2022-08-23 NOTE — Assessment & Plan Note (Signed)
Chronic, ongoing.  Diagnosed with severe OSA in May 2023.  Continue to use BiPAP nightly, she uses 100% of the time per report.

## 2022-08-24 ENCOUNTER — Encounter: Payer: Self-pay | Admitting: Nurse Practitioner

## 2022-08-24 LAB — LIPID PANEL W/O CHOL/HDL RATIO
Cholesterol, Total: 167 mg/dL (ref 100–199)
HDL: 77 mg/dL (ref >39)
LDL Chol Calc (NIH): 76 mg/dL (ref 0–99)
Triglycerides: 75 mg/dL (ref 0–149)
VLDL Cholesterol Cal: 14 mg/dL (ref 5–40)

## 2022-08-24 NOTE — Progress Notes (Signed)
Contacted via MyChart   Good morning Dominique Kerr, your labs have returned and overall look great.  Levels much improved with current medication.  Great news!!!  No changes needed. Keep being amazing!!  Thank you for allowing me to participate in your care.  I appreciate you. Kindest regards, Desmon Hitchner

## 2022-10-04 ENCOUNTER — Other Ambulatory Visit: Payer: Managed Care, Other (non HMO)

## 2022-10-04 DIAGNOSIS — E039 Hypothyroidism, unspecified: Secondary | ICD-10-CM

## 2022-10-05 NOTE — Progress Notes (Signed)
Contacted via MyChart   Good morning Zamyrah, your thyroid labs are now normal.  Continue current medication dosing and we will recheck again next visit.  Any questions? Keep being marvelous!!  Thank you for allowing me to participate in your care.  I appreciate you. Kindest regards, Bayard More

## 2022-11-20 ENCOUNTER — Other Ambulatory Visit: Payer: Self-pay | Admitting: Nurse Practitioner

## 2022-11-22 NOTE — Telephone Encounter (Signed)
Requested Prescriptions  Pending Prescriptions Disp Refills   levothyroxine (SYNTHROID) 125 MCG tablet [Pharmacy Med Name: LEVOTHYROXINE 125 MCG TABLET] 90 tablet 2    Sig: TAKE 1 TABLET BY MOUTH EVERY DAY     Endocrinology:  Hypothyroid Agents Passed - 11/20/2022  3:44 PM      Passed - TSH in normal range and within 360 days    TSH  Date Value Ref Range Status  10/04/2022 1.710 0.450 - 4.500 uIU/mL Final         Passed - Valid encounter within last 12 months    Recent Outpatient Visits           3 months ago Pure hypercholesterolemia   [place] [facility] [place], [place] T, NP   9 months ago Anxiety and depression   [place] [facility] [place], Corrie Dandy T, NP   1 year ago Adult hypothyroidism   [place] [facility] [place], Corrie Dandy T, NP   1 year ago Rash   [place] [facility] [place], Corrie Dandy T, NP   1 year ago Adult hypothyroidism   [place] [facility] [place], Dorie Rank, NP       Future Appointments             In 3 months [place], Dorie Rank, NP [facility]

## 2023-02-04 ENCOUNTER — Ambulatory Visit
Admission: RE | Admit: 2023-02-04 | Discharge: 2023-02-04 | Disposition: A | Discharge status: Home / Self Care | Payer: Managed Care, Other (non HMO) | Source: Ambulatory Visit | Attending: Physician Assistant | Admitting: Physician Assistant

## 2023-02-04 ENCOUNTER — Other Ambulatory Visit: Payer: Self-pay | Admitting: Physician Assistant

## 2023-02-04 DIAGNOSIS — R053 Chronic cough: Secondary | ICD-10-CM

## 2023-02-04 DIAGNOSIS — G8929 Other chronic pain: Secondary | ICD-10-CM

## 2023-02-19 NOTE — Patient Instructions (Signed)
 Be Involved in Caring For Your Health:  Taking Medications When medications are taken as directed, they can greatly improve your health. But if they are not taken as prescribed, they may not work. In some cases, not taking them correctly can be harmful. To help ensure your treatment remains effective and safe, understand your medications and how to take them. Bring your medications to each visit for review by your provider.  Your lab results, notes, and after visit summary will be available on My Chart. We strongly encourage you to use this feature. If lab results are abnormal the clinic will contact you with the appropriate steps. If the clinic does not contact you assume the results are satisfactory. You can always view your results on My Chart. If you have questions regarding your health or results, please contact the clinic during office hours. You can also ask questions on My Chart.  We at [facility] are grateful that you chose us  to provide your care. We strive to provide evidence-based and compassionate care and are always looking for feedback. If you get a survey from the clinic please complete this so we can hear your opinions.  Healthy Eating, Adult Healthy eating may help you get and keep a healthy body weight, reduce the risk of chronic disease, and live a long and productive life. It is important to follow a healthy eating pattern. Your nutritional and calorie needs should be met mainly by different nutrient-rich foods. What are tips for following this plan? Reading food labels Read labels and choose the following: Reduced or low sodium products. Juices with 100% fruit juice. Foods with low saturated fats (<3 g per serving) and high polyunsaturated and monounsaturated fats. Foods with whole grains, such as whole wheat, cracked wheat, brown rice, and wild rice. Whole grains that are fortified with folic acid. This is recommended for females who are pregnant or who want to  become pregnant. Read labels and do not eat or drink the following: Foods or drinks with added sugars. These include foods that contain brown sugar, corn sweetener, corn syrup, dextrose , fructose, glucose, high-fructose corn syrup, honey, invert sugar, lactose, malt syrup, maltose, molasses, raw sugar, sucrose, trehalose, or turbinado sugar. Limit your intake of added sugars to less than 10% of your total daily calories. Do not eat more than the following amounts of added sugar per day: 6 teaspoons (25 g) for females. 9 teaspoons (38 g) for males. Foods that contain processed or refined starches and grains. Refined grain products, such as white flour, degermed cornmeal, white bread, and white rice. Shopping Choose nutrient-rich snacks, such as vegetables, whole fruits, and nuts. Avoid high-calorie and high-sugar snacks, such as potato chips, fruit snacks, and candy. Use oil-based dressings and spreads on foods instead of solid fats such as butter, margarine, sour cream, or cream cheese. Limit pre-made sauces, mixes, and instant products such as flavored rice, instant noodles, and ready-made pasta. Try more plant-protein sources, such as tofu, tempeh, black beans, edamame, lentils, nuts, and seeds. Explore eating plans such as the Mediterranean diet or vegetarian diet. Try heart-healthy dips made with beans and healthy fats like hummus and guacamole. Vegetables go great with these. Cooking Use oil to saut or stir-fry foods instead of solid fats such as butter, margarine, or lard. Try baking, boiling, grilling, or broiling instead of frying. Remove the fatty part of meats before cooking. Steam vegetables in water  or broth. Meal planning  At meals, imagine dividing your plate into fourths: One-half of  your plate is fruits and vegetables. One-fourth of your plate is whole grains. One-fourth of your plate is protein, especially lean meats, poultry, eggs, tofu, beans, or nuts. Include low-fat  dairy as part of your daily diet. Lifestyle Choose healthy options in all settings, including home, work, school, restaurants, or stores. Prepare your food safely: Wash your hands after handling raw meats. Where you prepare food, keep surfaces clean by regularly washing with hot, soapy water . Keep raw meats separate from ready-to-eat foods, such as fruits and vegetables. Cook seafood, meat, poultry, and eggs to the recommended temperature. Get a food thermometer. Store foods at safe temperatures. In general: Keep cold foods at 84F (4.4C) or below. Keep hot foods at 184F (60C) or above. Keep your freezer at [facility] (-17.8C) or below. Foods are not safe to eat if they have been between the temperatures of 40-184F (4.4-60C) for more than 2 hours. What foods should I eat? Fruits Aim to eat 1-2 cups of fresh, canned (in natural juice), or frozen fruits each day. One cup of fruit equals 1 small apple, 1 large banana, 8 large strawberries, 1 cup (237 g) canned fruit,  cup (82 g) dried fruit, or 1 cup (240 mL) 100% juice. Vegetables Aim to eat 2-4 cups of fresh and frozen vegetables each day, including different varieties and colors. One cup of vegetables equals 1 cup (91 g) broccoli or cauliflower florets, 2 medium carrots, 2 cups (150 g) raw, leafy greens, 1 large tomato, 1 large bell pepper, 1 large sweet potato, or 1 medium white potato. Grains Aim to eat 5-10 ounce-equivalents of whole grains each day. Examples of 1 ounce-equivalent of grains include 1 slice of bread, 1 cup (40 g) ready-to-eat cereal, 3 cups (24 g) popcorn, or  cup (93 g) cooked rice. Meats and other proteins Try to eat 5-7 ounce-equivalents of protein each day. Examples of 1 ounce-equivalent of protein include 1 egg,  oz nuts (12 almonds, 24 pistachios, or 7 walnut halves), 1/4 cup (90 g) cooked beans, 6 tablespoons (90 g) hummus or 1 tablespoon (16 g) peanut butter. A cut of meat or fish that is the size of a deck of  cards is about 3-4 ounce-equivalents (85 g). Of the protein you eat each week, try to have at least 8 sounce (227 g) of seafood. This is about 2 servings per week. This includes salmon, trout, herring, sardines, and anchovies. Dairy Aim to eat 3 cup-equivalents of fat-free or low-fat dairy each day. Examples of 1 cup-equivalent of dairy include 1 cup (240 mL) milk, 8 ounces (250 g) yogurt, 1 ounces (44 g) natural cheese, or 1 cup (240 mL) fortified soy milk. Fats and oils Aim for about 5 teaspoons (21 g) of fats and oils per day. Choose monounsaturated fats, such as canola and olive oils, mayonnaise made with olive oil or avocado oil, avocados, peanut butter, and most nuts, or polyunsaturated fats, such as sunflower, corn, and soybean oils, walnuts, pine nuts, sesame seeds, sunflower seeds, and flaxseed. Beverages Aim for 6 eight-ounce glasses of water  per day. Limit coffee to 3-5 eight-ounce cups per day. Limit caffeinated beverages that have added calories, such as soda and energy drinks. If you drink alcohol: Limit how much you have to: 0-1 drink a day if you are female. 0-2 drinks a day if you are female. Know how much alcohol is in your drink. In the [place] one drink is one 12 oz bottle of beer (355 mL), one 5 oz glass of wine (  148 mL), or one 1 oz glass of hard liquor (44 mL). Seasoning and other foods Try not to add too much salt to your food. Try using herbs and spices instead of salt. Try not to add sugar to food. This information is based on [place] nutrition guidelines. To learn more, visit DisposableNylon.be. Exact amounts may vary. You may need different amounts. This information is not intended to replace advice given to you by your health care provider. Make sure you discuss any questions you have with your health care provider. Document Revised: 11/09/2021 Document Reviewed: 11/09/2021 Elsevier Patient Education  2024 [employer].

## 2023-02-21 ENCOUNTER — Encounter: Payer: Self-pay | Admitting: Adult Health

## 2023-02-21 ENCOUNTER — Ambulatory Visit: Payer: Managed Care, Other (non HMO) | Admitting: Adult Health

## 2023-02-21 VITALS — BP 147/77 | HR 77 | Ht 66.0 in | Wt 231.0 lb

## 2023-02-21 DIAGNOSIS — Z9989 Dependence on other enabling machines and devices: Secondary | ICD-10-CM | POA: Diagnosis not present

## 2023-02-21 DIAGNOSIS — G4730 Sleep apnea, unspecified: Secondary | ICD-10-CM | POA: Diagnosis not present

## 2023-02-21 NOTE — Patient Instructions (Signed)
Your Plan:  Continue nightly use of BiPAP for adequate sleep apnea management  Continue to follow with your DME [facility] for any needs supplies or CPAP related concerns     Follow-up in 1 year or call earlier if needed     Thank you for coming to see [place] at [facility]. I hope we have been able to provide you high quality care today.  You may receive a patient satisfaction survey over the next few weeks. We would appreciate your feedback and comments so that we may continue to improve ourselves and the health of our patients.

## 2023-02-21 NOTE — Progress Notes (Signed)
[facility] [address] [place] [address] [phone]       OFFICE FOLLOW UP NOTE  Ms. Clent Demark Date of Birth:  21-Nov-1960 Medical Record Number:  [id number]   Reason for visit: Initial CPAP follow-up    SUBJECTIVE:   CHIEF COMPLAINT:  Chief Complaint  Patient presents with   Follow-up    Pt alone, rm 3. Here today for yearly CPAP. Overall doing well. No new complaints. DME [employer]     Follow-up visit:  Prior visit: 02/16/2022  Brief HPI:   Melinda Dube is a 62 y.o. female who is followed for OSA on BiPAP.  HST 11/19/2020 showed severe OSA with total AHI 63.3/h and O2 nadir of 61% for nearly 1 hour indicating nocturnal hypoxemia.  Started on AutoPap therapy but was not fully treating apnea therefore pursued titration study and responded well to BiPAP therapy of 20/16.  BiPAP initiated 07/2021.  At prior visit, BiPAP report showed excellent usage with residual AHI 5.3.  Continues to tolerate BiPAP well.  ESS 5/24.    Interval history:  Continues to do well with BiPAP therapy.  Tolerating well without any difficulty.  Denies any significant daytime fatigue and sleeps well at night. ESS 5/24. Follows closely with DME [facility] and up to date on supplies.  No questions or concerns today.           ROS:   14 system review of systems performed and negative with exception of those listed in HPI  PMH:  Past Medical History:  Diagnosis Date   Allergy    Anemia    YEARS AGO WHILE HAVING PERIODS   Anxiety    Arthritis    Dysplastic nevus 06/15/2006   epigastric area - moderate   GERD (gastroesophageal reflux disease)    HSV (herpes simplex virus) infection    Hyperlipidemia    Hypertension    H/O LOST WEIGHT AND NO MEDS X 6 YRS    Obesity    PMDD (premenstrual dysphoric disorder)    Pre-diabetes    PRIOR TO GASTRIC SLEEVE   Sleep apnea    DOES NOT USE CPAP   Sleep apnea    Thyroid disease      PSH:  Past Surgical History:  Procedure Laterality Date   BLADDER SURGERY     CHOLECYSTECTOMY     KNEE ARTHROSCOPY Left    KNEE ARTHROSCOPY Left 04/09/2020   Procedure: ARTHROSCOPY KNEE;  Surgeon: Donato Heinz, MD;  Location: [facility] ORS;  Service: Orthopedics;  Laterality: Left;   LAPAROSCOPIC GASTRIC SLEEVE RESECTION     parotid gland removal      Social History:  Social History   Socioeconomic History   Marital status: Single    Spouse name: Not on file   Number of children: Not on file   Years of education: Not on file   Highest education level: 12th grade  Occupational History   Not on file  Tobacco Use   Smoking status: Former    Current packs/day: 0.00    Average packs/day: 0.3 packs/day for 3.0 years (0.8 ttl pk-yrs)    Types: Cigarettes    Start date: 03/31/1997    Quit date: 03/31/2000    Years since quitting: 22.9   Smokeless tobacco: Never  Vaping Use   Vaping status: Never Used  Substance and Sexual Activity   Alcohol use: Yes    Alcohol/week: 0.0 standard drinks of alcohol    Comment: RARELY  Drug use: No   Sexual activity: Never  Other Topics Concern   Not on file  Social History Narrative   Not on file   Social Drivers of Health   Financial Resource Strain: Low Risk  (02/20/2023)   Overall Financial Resource Strain (CARDIA)    Difficulty of Paying Living Expenses: Not hard at all  Food Insecurity: No Food Insecurity (02/20/2023)   Hunger Vital Sign    Worried About Running Out of Food in the Last Year: Never true    Ran Out of Food in the Last Year: Never true  Transportation Needs: No Transportation Needs (02/20/2023)   PRAPARE - Administrator, Civil Service (Medical): No    Lack of Transportation (Non-Medical): No  Physical Activity: Insufficiently Active (02/20/2023)   Exercise Vital Sign    Days of Exercise per Week: 5 days    Minutes of Exercise per Session: 10 min  Stress: No Stress Concern Present (02/20/2023)    [employer] of Occupational Health - Occupational Stress Questionnaire    Feeling of Stress : Only a little  Social Connections: Moderately Isolated (02/20/2023)   Social Connection and Isolation Panel [NHANES]    Frequency of Communication with Friends and Family: More than three times a week    Frequency of Social Gatherings with Friends and Family: More than three times a week    Attends Religious Services: 1 to 4 times per year    Active Member of [employer] or Organizations: No    Attends Engineer, structural: Not on file    Marital Status: Never married  Catering manager Violence: Not on file    Family History:  Family History  Problem Relation Age of Onset   Diabetes Mother    Hepatitis C Mother    Sleep apnea Father    Arthritis Sister    Diabetes Maternal Grandmother    Breast cancer Neg Hx     Medications:   Current Outpatient Medications on File Prior to Visit  Medication Sig Dispense Refill   aspirin EC 81 MG tablet Take 81 mg by mouth daily.      atorvastatin (LIPITOR) 40 MG tablet Take 1 tablet (40 mg total) by mouth daily. 90 tablet 4   celecoxib (CELEBREX) 200 MG capsule Take 1 capsule (200 mg total) by mouth daily as needed for moderate pain. 30 capsule 12   Cholecalciferol (VITAMIN D) 50 MCG (2000 UT) tablet Take 2,000 Units by mouth daily.     diphenhydramine-acetaminophen (TYLENOL PM) 25-500 MG TABS tablet Take 2 tablets by mouth at bedtime as needed (sleep).     estradiol (CLIMARA - DOSED IN MG/24 HR) 0.05 mg/24hr patch Place 0.05 mg onto the skin every Friday.     levothyroxine (SYNTHROID) 125 MCG tablet TAKE 1 TABLET BY MOUTH EVERY DAY 90 tablet 2   Multiple Vitamin (MULTIVITAMIN WITH MINERALS) TABS tablet Take 1 tablet by mouth daily.     omeprazole (PRILOSEC) 20 MG capsule TAKE 1 CAPSULE BY MOUTH EVERY DAY 90 capsule 4   sertraline (ZOLOFT) 100 MG tablet Take 1 tablet (100 mg total) by mouth daily. 90 tablet 4   triamcinolone cream (KENALOG) 0.1  % Apply 1 application topically 2 (two) times daily. 30 g 0   No current facility-administered medications on file prior to visit.    Allergies:   Allergies  Allergen Reactions   Lisinopril Cough      OBJECTIVE:  Physical Exam  Vitals:   02/21/23 3664  BP: (!) 147/77  Pulse: 77  Weight: 231 lb (104.8 kg)  Height: 5\' 6"  (1.676 m)   Body mass index is 37.28 kg/m. No results found.  General: well developed, well nourished, very pleasant middle-age Caucasian female, seated, in no evident distress  Neurologic Exam Mental Status: Awake and fully alert. Oriented to place and time. Recent and remote memory intact. Attention span, concentration and fund of knowledge appropriate. Mood and affect appropriate.  Cranial Nerves: Pupils equal, briskly reactive to light. Extraocular movements full without nystagmus. Visual fields full to confrontation. Hearing intact. Facial sensation intact. Face, tongue, palate moves normally and symmetrically.  Motor: Normal bulk and tone. Normal strength in all tested extremity muscles Gait and Station: Arises from chair without difficulty. Stance is normal. Gait demonstrates normal stride length and balance without use of AD. Tandem walk and heel toe without difficulty.         ASSESSMENT/PLAN: Marilynn Merola is a 62 y.o. year old female    OSA on BiPAP : Compliance report shows satisfactory usage with optimal residual AHI.  Continue current pressure settings.  Discussed continued nightly usage with ensuring greater than 4 hours nightly for optimal benefit and per insurance purposes.  Continue to follow with DME company for any needed supplies or CPAP related concerns     Follow up in 1 year (prefers in office visit) or call earlier if needed   CC:  PCP: Marjie Skiff, NP    I spent 15 minutes of face-to-face and non-face-to-face time with patient.  This included previsit chart review, lab review, study review, order entry,  electronic health record documentation, patient education and discussion regarding above diagnoses and treatment plan and answered all other questions to patient's satisfaction  Ihor Austin, [facility]  [facility] [address] [place], [place] [address]  Phone [phone] Fax [phone] Note: This document was prepared with digital dictation and possible smart phrase technology. Any transcriptional errors that result from this process are unintentional.

## 2023-02-23 ENCOUNTER — Ambulatory Visit: Payer: Managed Care, Other (non HMO) | Admitting: Nurse Practitioner

## 2023-02-24 ENCOUNTER — Ambulatory Visit: Payer: Managed Care, Other (non HMO) | Admitting: Nurse Practitioner

## 2023-02-24 ENCOUNTER — Telehealth: Payer: Self-pay

## 2023-02-24 ENCOUNTER — Other Ambulatory Visit: Payer: Self-pay | Admitting: Physician Assistant

## 2023-02-24 ENCOUNTER — Encounter: Payer: Self-pay | Admitting: Nurse Practitioner

## 2023-02-24 VITALS — BP 127/79 | HR 74 | Temp 98.5°F | Ht 66.0 in | Wt 226.0 lb

## 2023-02-24 DIAGNOSIS — F419 Anxiety disorder, unspecified: Secondary | ICD-10-CM

## 2023-02-24 DIAGNOSIS — E039 Hypothyroidism, unspecified: Secondary | ICD-10-CM

## 2023-02-24 DIAGNOSIS — E7800 Pure hypercholesterolemia, unspecified: Secondary | ICD-10-CM

## 2023-02-24 DIAGNOSIS — R7301 Impaired fasting glucose: Secondary | ICD-10-CM | POA: Diagnosis not present

## 2023-02-24 DIAGNOSIS — Z79890 Hormone replacement therapy (postmenopausal): Secondary | ICD-10-CM

## 2023-02-24 DIAGNOSIS — R053 Chronic cough: Secondary | ICD-10-CM

## 2023-02-24 DIAGNOSIS — Z23 Encounter for immunization: Secondary | ICD-10-CM

## 2023-02-24 DIAGNOSIS — G4733 Obstructive sleep apnea (adult) (pediatric): Secondary | ICD-10-CM

## 2023-02-24 DIAGNOSIS — R9389 Abnormal findings on diagnostic imaging of other specified body structures: Secondary | ICD-10-CM

## 2023-02-24 DIAGNOSIS — F32A Depression, unspecified: Secondary | ICD-10-CM

## 2023-02-24 LAB — BAYER DCA HB A1C WAIVED: HB A1C (BAYER DCA - WAIVED): 6.4 % — ABNORMAL HIGH (ref 4.8–5.6)

## 2023-02-24 NOTE — Assessment & Plan Note (Signed)
Followed by GYN, continue current regimen as prescribed by them. 

## 2023-02-24 NOTE — Assessment & Plan Note (Signed)
 Chronic, stable.  Denies SI/HI.  Continue current medication regimen and adjust as needed.

## 2023-02-24 NOTE — Assessment & Plan Note (Addendum)
 Ongoing.  Recent A1c 6.2%, similar to prior check.  Recommend heavy focus on diet and regular exercise. She has lost 5 pounds since last visit. Check A1c today.  Start medications as needed.

## 2023-02-24 NOTE — Assessment & Plan Note (Signed)
 Chronic, ongoing, tolerating Atorvastatin at 40 MG daily.  Continue regimen and adjust as needed. Lipid panel today.

## 2023-02-24 NOTE — Progress Notes (Signed)
 BP 127/79 (BP Location: Left Arm, Cuff Size: Large)   Pulse 74   Temp 98.5 F (36.9 C) (Oral)   Ht 5' 6 (1.676 m)   Wt 226 lb (102.5 kg)   LMP  (LMP Unknown)   SpO2 98%   BMI 36.48 kg/m    Subjective:    Patient ID: Dominique Kerr, female    DOB: [birth year], 63 y.o.   MRN: [id number]  HPI: Dominique Kerr is a 63 y.o. female  Chief Complaint  Patient presents with   Depression   Hypothyroidism   Prediabetes   HYPOTHYROIDISM Currently taking Levothyroxine  125 MCG daily.   Thyroid  control status:stable Satisfied with current treatment? yes Medication side effects: no Medication compliance: good compliance Etiology of hypothyroidism:  Recent dose adjustment:no Fatigue: sometimes Cold intolerance: no Heat intolerance: no Weight gain: no Weight loss: no Constipation: yes Diarrhea/loose stools: no Palpitations: rare Lower extremity edema: no Anxiety/depressed mood: sometimes   Impaired Fasting Glucose Ongoing elevation on labs.  Diet focused.  Has severe OSA and uses CPAP nightly. HbA1C:  Lab Results  Component Value Date   HGBA1C 6.2 08/18/2022  Duration of elevated blood sugar:  Polydipsia: no Polyuria: no Weight change: no Visual disturbance: no Glucose Monitoring: no    Accucheck frequency: Not Checking    Fasting glucose:     Post prandial:  Diabetic Education: Not Completed Family history of diabetes: yes - mom and grandmother The 10-year ASCVD risk score (Arnett DK, et al., 2019) is: 2.2%   Values used to calculate the score:     Age: 89 years     Sex: Female     Is Non-Hispanic African American: No     Diabetic: No     Tobacco smoker: No     Systolic Blood Pressure: 127 mmHg     Is BP treated: No     HDL Cholesterol: 77 mg/dL     Total Cholesterol: 167 mg/dL   DEPRESSION Continues on Zoloft  100 MG daily. Goes to gynecology on 03/24/22 -- continues on Climara and progesterone.  Mood status: stable Satisfied with current  treatment?: yes Symptom severity: moderate  Duration of current treatment : chronic Side effects: no Medication compliance: good compliance Psychotherapy/counseling: yes in the past Depressed mood:  occasional Anxious mood: occasional Anhedonia: no Significant weight loss or gain: no Insomnia: none Fatigue: occasional Feelings of worthlessness or guilt: no Impaired concentration/indecisiveness: no Suicidal ideations: no Hopelessness: no Crying spells: no    02/24/2023    8:10 AM 08/23/2022    8:14 AM 02/19/2022    8:24 AM 08/20/2021    8:32 AM 02/19/2021    9:32 AM  Depression screen PHQ 2/9  Decreased Interest 0 0 0 0 0  Down, Depressed, Hopeless 0 0 0 1 0  PHQ - 2 Score 0 0 0 1 0  Altered sleeping 1 1 0 0 0  Tired, decreased energy 1 0 0 1 0  Change in appetite 1 1 1 1  0  Feeling bad or failure about yourself  0 0 0 0 0  Trouble concentrating 0 0 0 0 0  Moving slowly or fidgety/restless 0 0 0 0 0  Suicidal thoughts 0 0 0 0 0  PHQ-9 Score 3 2 1 3  0  Difficult doing work/chores Not difficult at all  Not difficult at all Not difficult at all Not difficult at all       02/24/2023    8:10 AM 08/23/2022    8:14  AM 02/19/2022    8:24 AM 08/20/2021    8:32 AM  GAD 7 : Generalized Anxiety Score  Nervous, Anxious, on Edge 0 0 0 0  Control/stop worrying 1 0 1 0  Worry too much - different things 1 0 1 1  Trouble relaxing 0 0 0 0  Restless 0 0 0 0  Easily annoyed or irritable 1 0 0 1  Afraid - awful might happen 0 0 0 0  Total GAD 7 Score 3 0 2 2  Anxiety Difficulty Not difficult at all Not difficult at all Not difficult at all Not difficult at all      Relevant past medical, surgical, family and social history reviewed and updated as indicated. Interim medical history since our last visit reviewed. Allergies and medications reviewed and updated.  Review of Systems  Constitutional:  Negative for activity change, appetite change, diaphoresis, fatigue and fever.  Respiratory:   Negative for cough, chest tightness and shortness of breath.   Cardiovascular:  Negative for chest pain, palpitations and leg swelling.  Gastrointestinal: Negative.   Endocrine: Negative for cold intolerance, heat intolerance, polydipsia, polyphagia and polyuria.  Neurological: Negative.   Psychiatric/Behavioral: Negative.      Per HPI unless specifically indicated above     Objective:    BP 127/79 (BP Location: Left Arm, Cuff Size: Large)   Pulse 74   Temp 98.5 F (36.9 C) (Oral)   Ht 5' 6 (1.676 m)   Wt 226 lb (102.5 kg)   LMP  (LMP Unknown)   SpO2 98%   BMI 36.48 kg/m   Wt Readings from Last 3 Encounters:  02/24/23 226 lb (102.5 kg)  02/21/23 231 lb (104.8 kg)  08/23/22 212 lb 3.2 oz (96.3 kg)    Physical Exam Vitals and nursing note reviewed.  Constitutional:      General: She is awake. She is not in acute distress.    Appearance: She is well-developed and well-groomed. She is obese. She is not ill-appearing or toxic-appearing.  HENT:     Head: Normocephalic.     Right Ear: Hearing and external ear normal.     Left Ear: Hearing and external ear normal.  Eyes:     General: Lids are normal.        Right eye: No discharge.        Left eye: No discharge.     Conjunctiva/sclera: Conjunctivae normal.     Pupils: Pupils are equal, round, and reactive to light.  Neck:     Thyroid : No thyromegaly.     Vascular: No carotid bruit.  Cardiovascular:     Rate and Rhythm: Normal rate and regular rhythm.     Heart sounds: Normal heart sounds. No murmur heard.    No gallop.  Pulmonary:     Effort: Pulmonary effort is normal. No accessory muscle usage or respiratory distress.     Breath sounds: Normal breath sounds.  Abdominal:     General: Bowel sounds are normal. There is no distension.     Palpations: Abdomen is soft.     Tenderness: There is no abdominal tenderness.  Musculoskeletal:     Cervical back: Normal range of motion and neck supple.     Right lower leg: No  edema.     Left lower leg: No edema.  Lymphadenopathy:     Cervical: No cervical adenopathy.  Skin:    General: Skin is warm and dry.  Neurological:     Mental Status: She  is alert and oriented to person, place, and time.     Deep Tendon Reflexes: Reflexes are normal and symmetric.     Reflex Scores:      Brachioradialis reflexes are 2+ on the right side and 2+ on the left side.      Patellar reflexes are 2+ on the right side and 2+ on the left side. Psychiatric:        Attention and Perception: Attention normal.        Mood and Affect: Mood normal.        Speech: Speech normal.        Behavior: Behavior normal. Behavior is cooperative.        Thought Content: Thought content normal.    Results for orders placed or performed in visit on 10/04/22  TSH   Collection Time: 10/04/22  8:22 AM  Result Value Ref Range   TSH 1.710 0.450 - 4.500 uIU/mL  T4, free   Collection Time: 10/04/22  8:22 AM  Result Value Ref Range   Free T4 1.20 0.82 - 1.77 ng/dL      Assessment & Plan:   Problem List Items Addressed This Visit       Respiratory   Obstructive sleep apnea of adult   Chronic, ongoing.  Diagnosed with severe OSA in May 2023.  Continue to use BiPAP nightly, she uses 100% of the time per report.        Endocrine   Adult hypothyroidism   Chronic, ongoing.  Will continue Levothyroxine  125 MCG and adjust further as needed.  Plan on recheck of labs at physical, recent were stable.      Relevant Orders   CBC with Differential/Platelet   IFG (impaired fasting glucose)   Ongoing.  Recent A1c 6.2%, similar to prior check.  Recommend heavy focus on diet and regular exercise. She has lost 5 pounds since last visit. Check A1c today.  Start medications as needed.      Relevant Orders   Bayer DCA Hb A1c Waived     Other   Anxiety and depression - Primary   Chronic, stable.  Denies SI/HI.  Continue current medication regimen and adjust as needed.       HLD (hyperlipidemia)    Chronic, ongoing, tolerating Atorvastatin  at 40 MG daily.  Continue regimen and adjust as needed. Lipid panel today.      Relevant Orders   Comprehensive metabolic panel   Lipid Panel w/o Chol/HDL Ratio   Postmenopausal HRT (hormone replacement therapy)   Followed by GYN, continue current regimen as prescribed by them.        Follow up plan: Return in about 6 months (around 08/24/2023) for Annual Physical -- due after 08/23/23.

## 2023-02-24 NOTE — Assessment & Plan Note (Signed)
 Chronic, ongoing.  Will continue Levothyroxine 125 MCG and adjust further as needed.  Plan on recheck of labs at physical, recent were stable.

## 2023-02-24 NOTE — Assessment & Plan Note (Signed)
Chronic, ongoing.  Diagnosed with severe OSA in May 2023.  Continue to use BiPAP nightly, she uses 100% of the time per report.

## 2023-02-25 LAB — CBC WITH DIFFERENTIAL/PLATELET
Basophils Absolute: 0 10*3/uL (ref 0.0–0.2)
Basos: 1 %
EOS (ABSOLUTE): 0.1 10*3/uL (ref 0.0–0.4)
Eos: 2 %
Hematocrit: 37.1 % (ref 34.0–46.6)
Hemoglobin: 11.7 g/dL (ref 11.1–15.9)
Immature Grans (Abs): 0 10*3/uL (ref 0.0–0.1)
Immature Granulocytes: 0 %
Lymphocytes Absolute: 1.3 10*3/uL (ref 0.7–3.1)
Lymphs: 24 %
MCH: 26.8 pg (ref 26.6–33.0)
MCHC: 31.5 g/dL (ref 31.5–35.7)
MCV: 85 fL (ref 79–97)
Monocytes Absolute: 0.4 10*3/uL (ref 0.1–0.9)
Monocytes: 7 %
Neutrophils Absolute: 3.5 10*3/uL (ref 1.4–7.0)
Neutrophils: 66 %
Platelets: 295 10*3/uL (ref 150–450)
RBC: 4.37 x10E6/uL (ref 3.77–5.28)
RDW: 13.1 % (ref 11.7–15.4)
WBC: 5.3 10*3/uL (ref 3.4–10.8)

## 2023-02-25 LAB — COMPREHENSIVE METABOLIC PANEL
ALT: 43 [IU]/L — ABNORMAL HIGH (ref 0–32)
AST: 34 [IU]/L (ref 0–40)
Albumin: 4.4 g/dL (ref 3.8–4.9)
Alkaline Phosphatase: 103 [IU]/L (ref 44–121)
BUN/Creatinine Ratio: 16 (ref 12–28)
BUN: 12 mg/dL (ref 8–27)
Bilirubin Total: 0.3 mg/dL (ref 0.0–1.2)
CO2: 24 mmol/L (ref 20–29)
Calcium: 9.5 mg/dL (ref 8.7–10.3)
Chloride: 103 mmol/L (ref 96–106)
Creatinine, Ser: 0.76 mg/dL (ref 0.57–1.00)
Globulin, Total: 2.7 g/dL (ref 1.5–4.5)
Glucose: 104 mg/dL — ABNORMAL HIGH (ref 70–99)
Potassium: 4.2 mmol/L (ref 3.5–5.2)
Sodium: 142 mmol/L (ref 134–144)
Total Protein: 7.1 g/dL (ref 6.0–8.5)
eGFR: 90 mL/min/{1.73_m2} (ref >59)

## 2023-02-25 LAB — LIPID PANEL W/O CHOL/HDL RATIO
Cholesterol, Total: 210 mg/dL — ABNORMAL HIGH (ref 100–199)
HDL: 92 mg/dL (ref >39)
LDL Chol Calc (NIH): 101 mg/dL — ABNORMAL HIGH (ref 0–99)
Triglycerides: 100 mg/dL (ref 0–149)
VLDL Cholesterol Cal: 17 mg/dL (ref 5–40)

## 2023-02-25 NOTE — Progress Notes (Signed)
 Contacted via MyChart   Good morning Dominique Kerr, your labs have returned: - Kidney function, creatinine and eGFR, remains normal.  Liver function, AST and ALT, shows mild elevation in ALT (very mild). We will continue to monitor this. - Lipid panel is showing elevations this check.  I am guessing you were not fasting, correct?  We will recheck at your next visit, your physical, with you fasting and see if we need to adjust Atorvastatin .  Any questions? Keep being amazing!!  Thank you for allowing me to participate in your care.  I appreciate you. Kindest regards, Treysen Sudbeck

## 2023-03-01 ENCOUNTER — Encounter: Payer: Self-pay | Admitting: Physician Assistant

## 2023-03-08 ENCOUNTER — Ambulatory Visit
Admission: RE | Admit: 2023-03-08 | Discharge: 2023-03-08 | Disposition: A | Discharge status: Home / Self Care | Payer: Managed Care, Other (non HMO) | Source: Ambulatory Visit | Attending: Physician Assistant | Admitting: Physician Assistant

## 2023-03-08 DIAGNOSIS — R053 Chronic cough: Secondary | ICD-10-CM

## 2023-03-08 DIAGNOSIS — R9389 Abnormal findings on diagnostic imaging of other specified body structures: Secondary | ICD-10-CM

## 2023-03-08 MED ORDER — IOPAMIDOL (ISOVUE-300) INJECTION 61%
75.0000 mL | Freq: Once | INTRAVENOUS | Status: AC | PRN
  Administered 2023-03-08: 75 mL via INTRAVENOUS

## 2023-03-30 ENCOUNTER — Other Ambulatory Visit: Payer: Self-pay

## 2023-03-30 DIAGNOSIS — Z1231 Encounter for screening mammogram for malignant neoplasm of breast: Secondary | ICD-10-CM

## 2023-04-19 ENCOUNTER — Ambulatory Visit
Admission: RE | Admit: 2023-04-19 | Discharge: 2023-04-19 | Disposition: A | Discharge status: Home / Self Care | Payer: Managed Care, Other (non HMO) | Source: Ambulatory Visit

## 2023-04-19 DIAGNOSIS — Z1231 Encounter for screening mammogram for malignant neoplasm of breast: Secondary | ICD-10-CM | POA: Diagnosis present

## 2023-04-21 ENCOUNTER — Other Ambulatory Visit: Payer: Self-pay

## 2023-04-21 DIAGNOSIS — R921 Mammographic calcification found on diagnostic imaging of breast: Secondary | ICD-10-CM

## 2023-04-21 DIAGNOSIS — R928 Other abnormal and inconclusive findings on diagnostic imaging of breast: Secondary | ICD-10-CM

## 2023-04-21 DIAGNOSIS — N6489 Other specified disorders of breast: Secondary | ICD-10-CM

## 2023-04-25 ENCOUNTER — Ambulatory Visit
Admission: RE | Admit: 2023-04-25 | Discharge: 2023-04-25 | Disposition: A | Discharge status: Home / Self Care | Payer: Managed Care, Other (non HMO) | Source: Ambulatory Visit

## 2023-04-25 DIAGNOSIS — N6489 Other specified disorders of breast: Secondary | ICD-10-CM | POA: Diagnosis present

## 2023-04-25 DIAGNOSIS — R928 Other abnormal and inconclusive findings on diagnostic imaging of breast: Secondary | ICD-10-CM | POA: Diagnosis present

## 2023-04-25 DIAGNOSIS — R921 Mammographic calcification found on diagnostic imaging of breast: Secondary | ICD-10-CM | POA: Diagnosis present

## 2023-04-28 ENCOUNTER — Other Ambulatory Visit: Payer: Self-pay

## 2023-04-28 DIAGNOSIS — N6489 Other specified disorders of breast: Secondary | ICD-10-CM

## 2023-04-28 DIAGNOSIS — R928 Other abnormal and inconclusive findings on diagnostic imaging of breast: Secondary | ICD-10-CM

## 2023-05-09 ENCOUNTER — Ambulatory Visit
Admission: RE | Admit: 2023-05-09 | Discharge: 2023-05-09 | Disposition: A | Discharge status: Home / Self Care | Source: Ambulatory Visit

## 2023-05-09 DIAGNOSIS — N6489 Other specified disorders of breast: Secondary | ICD-10-CM | POA: Diagnosis present

## 2023-05-09 DIAGNOSIS — R928 Other abnormal and inconclusive findings on diagnostic imaging of breast: Secondary | ICD-10-CM | POA: Insufficient documentation

## 2023-05-09 DIAGNOSIS — D0511 Intraductal carcinoma in situ of right breast: Secondary | ICD-10-CM | POA: Diagnosis present

## 2023-05-09 HISTORY — PX: BREAST BIOPSY: SHX20

## 2023-05-09 HISTORY — PX: BLADDER SURGERY: SHX569

## 2023-05-09 MED ORDER — LIDOCAINE HCL 1 % IJ SOLN
10.0000 mL | Freq: Once | INTRAMUSCULAR | Status: AC
  Administered 2023-05-09: 10 mL
  Filled 2023-05-09: qty 10

## 2023-05-09 MED ORDER — LIDOCAINE-EPINEPHRINE 1 %-1:100000 IJ SOLN
10.0000 mL | Freq: Once | INTRAMUSCULAR | Status: AC
  Administered 2023-05-09: 10 mL
  Filled 2023-05-09: qty 10

## 2023-05-09 MED ORDER — LIDOCAINE 1 % OPTIME INJ - NO CHARGE
5.0000 mL | Freq: Once | INTRAMUSCULAR | Status: AC
  Administered 2023-05-09: 5 mL
  Filled 2023-05-09: qty 6

## 2023-05-10 ENCOUNTER — Encounter: Payer: Self-pay | Admitting: *Deleted

## 2023-05-10 DIAGNOSIS — D0511 Intraductal carcinoma in situ of right breast: Secondary | ICD-10-CM

## 2023-05-10 LAB — SURGICAL PATHOLOGY

## 2023-05-10 NOTE — Progress Notes (Signed)
 Received referral for newly diagnosed breast cancer from [facility].  Navigation initiated.  She will see Dr. Smith Robert on Friday 3/21 and Dr. Claudine Mouton on 3/25.

## 2023-05-13 ENCOUNTER — Inpatient Hospital Stay

## 2023-05-13 ENCOUNTER — Inpatient Hospital Stay: Admitting: Oncology

## 2023-05-16 NOTE — H&P (View-Only) (Signed)
 Patient ID: Dominique Kerr, female   DOB: 1961-02-20, 63 y.o.   MRN: [id number]  Chief Complaint: DCIS right breast  History of Present Illness Dominique Kerr is a 63 y.o. female with an abnormal screening mammogram, showing an area of microcalcifications in the upper outer quadrant of the right breast.  She underwent stereotactic breast biopsy, and the marker was left at the posterior most calcifications.  Diagnosis of DCIS with ER positive.  She utilized birth control earlier in life and has now taking estrogen transdermally.  She is postmenopausal, and the estrogen has been helpful at controlling her symptoms.  She has no family history of breast cancer.  She began menstruating at the age of 37.  She has been pregnant once with a likely miscarriage.  She was 63 years old when she was pregnant she never breast-fed, never felt a breast lump, never had any discharge from her nipple, never noted any skin changes nor had any breast pain.  She presents today with her sister.   She sees oncology tomorrow.  Past Medical History Past Medical History:  Diagnosis Date   Allergy    Anemia    YEARS AGO WHILE HAVING PERIODS   Anxiety    Arthritis    Dysplastic nevus 06/15/2006   epigastric area - moderate   GERD (gastroesophageal reflux disease)    HSV (herpes simplex virus) infection    Hyperlipidemia    Hypertension    H/O LOST WEIGHT AND NO MEDS X 6 YRS    Obesity    PMDD (premenstrual dysphoric disorder)    Pre-diabetes    PRIOR TO GASTRIC SLEEVE   Sleep apnea    DOES NOT USE CPAP   Sleep apnea    Thyroid disease       Past Surgical History:  Procedure Laterality Date   BLADDER SURGERY Right 05/09/2023   rt breast stereo calcs asy x clip path pending   BREAST BIOPSY     BREAST BIOPSY Right 05/09/2023   MM RT BREAST BX W LOC DEV 1ST LESION IMAGE BX SPEC STEREO GUIDE 05/09/2023 [facility]   CHOLECYSTECTOMY     KNEE ARTHROSCOPY Left    KNEE ARTHROSCOPY Left 04/09/2020    Procedure: ARTHROSCOPY KNEE;  Surgeon: Donato Heinz, MD;  Location: [facility];  Service: Orthopedics;  Laterality: Left;   LAPAROSCOPIC GASTRIC SLEEVE RESECTION     parotid gland removal      Allergies  Allergen Reactions   Lisinopril Cough    Current Outpatient Medications  Medication Sig Dispense Refill   aspirin EC 81 MG tablet Take 81 mg by mouth daily.      atorvastatin (LIPITOR) 40 MG tablet Take 1 tablet (40 mg total) by mouth daily. 90 tablet 4   Azelastine HCl 137 MCG/SPRAY SOLN Place 2 sprays into both nostrils daily.     celecoxib (CELEBREX) 200 MG capsule Take 1 capsule (200 mg total) by mouth daily as needed for moderate pain. 30 capsule 12   Cholecalciferol (VITAMIN D) 50 MCG (2000 UT) tablet Take 2,000 Units by mouth daily.     diphenhydramine-acetaminophen (TYLENOL PM) 25-500 MG TABS tablet Take 2 tablets by mouth at bedtime as needed (sleep).     estradiol (CLIMARA - DOSED IN MG/24 HR) 0.05 mg/24hr patch Place 0.05 mg onto the skin every Friday.     levothyroxine (SYNTHROID) 125 MCG tablet TAKE 1 TABLET BY MOUTH EVERY DAY 90 tablet 2   Multiple Vitamin (MULTIVITAMIN WITH MINERALS) TABS tablet Take  1 tablet by mouth daily.     omeprazole (PRILOSEC) 20 MG capsule TAKE 1 CAPSULE BY MOUTH EVERY DAY 90 capsule 4   progesterone (PROMETRIUM) 200 MG capsule Take 200 mg by mouth daily.     sertraline (ZOLOFT) 100 MG tablet Take 1 tablet (100 mg total) by mouth daily. 90 tablet 4   triamcinolone cream (KENALOG) 0.1 % Apply 1 application topically 2 (two) times daily. 30 g 0   No current facility-administered medications for this visit.    Family History Family History  Problem Relation Age of Onset   Diabetes Mother    Hepatitis C Mother    Sleep apnea Father    Arthritis Sister    Diabetes Maternal Grandmother    Breast cancer Neg Hx       Social History Social History   Tobacco Use   Smoking status: Former    Current packs/day: 0.00    Average packs/day:  0.3 packs/day for 3.0 years (0.8 ttl pk-yrs)    Types: Cigarettes    Start date: 03/31/1997    Quit date: 03/31/2000    Years since quitting: 23.1   Smokeless tobacco: Never  Vaping Use   Vaping status: Never Used  Substance Use Topics   Alcohol use: Yes    Alcohol/week: 0.0 standard drinks of alcohol    Comment: RARELY   Drug use: No        Review of Systems  Constitutional: Negative.   HENT: Negative.    Eyes: Negative.   Respiratory:  Positive for cough.   Cardiovascular: Negative.   Gastrointestinal:  Positive for heartburn.  Genitourinary: Negative.   Skin: Negative.   Neurological: Negative.   Psychiatric/Behavioral: Negative.       Physical Exam Blood pressure (!) 166/89, pulse 80, temperature 98.6 F (37 C), temperature source Oral, height 5\' 6"  (1.676 m), weight 231 lb 9.6 oz (105.1 kg), SpO2 95%. Last Weight  Most recent update: 05/17/2023  2:55 PM    Weight  105.1 kg (231 lb 9.6 oz)             CONSTITUTIONAL: Well developed, and nourished, appropriately responsive and aware without distress.   EYES: Sclera non-icteric.   EARS, NOSE, MOUTH AND THROAT:  The oropharynx is clear. Oral mucosa is pink and moist.    Hearing is intact to voice.  NECK: Trachea is midline, and there is no jugular venous distension.  LYMPH NODES:  Lymph nodes in the neck are not appreciated. RESPIRATORY:  Lungs are clear, and breath sounds are equal bilaterally.  Normal respiratory effort without pathologic use of accessory muscles. CARDIOVASCULAR: Heart is regular in rate and rhythm.   Well perfused.  GI: The abdomen is well rounded,  soft, nontender, and nondistended. There were no palpable masses.  I did not appreciate hepatosplenomegaly.  MUSCULOSKELETAL:  Symmetrical muscle tone appreciated in all four extremities.    SKIN: Skin turgor is normal. No pathologic skin lesions appreciated.  NEUROLOGIC:  Motor and sensation appear grossly normal.  Cranial nerves are grossly without  defect. PSYCH:  Alert and oriented to person, place and time. Affect is appropriate for situation.  Data Reviewed I have personally reviewed what is currently available of the patient's imaging, recent labs and medical records.   Labs:     Latest Ref Rng & Units 02/24/2023    8:53 AM 08/18/2022   12:00 AM 08/20/2021    9:13 AM  CBC  WBC 3.4 - 10.8 x10E3/uL 5.3  4.8  4.4   Hemoglobin 11.1 - 15.9 g/dL 42.5  95.6     38.7   Hematocrit 34.0 - 46.6 % 37.1  37     37.0   Platelets 150 - 450 x10E3/uL 295  270     274      This result is from an external source.      Latest Ref Rng & Units 02/24/2023    8:53 AM 08/18/2022   12:00 AM 08/17/2022   12:00 AM  CMP  Glucose 70 - 99 mg/dL 564     BUN 8 - 27 mg/dL 12   15      Creatinine 0.57 - 1.00 mg/dL 3.32   0.7      Sodium 134 - 144 mmol/L 142   140      Potassium 3.5 - 5.2 mmol/L 4.2   4.4      Chloride 96 - 106 mmol/L 103   102      CO2 20 - 29 mmol/L 24   23      Calcium 8.7 - 10.3 mg/dL 9.5  8.9       Total Protein 6.0 - 8.5 g/dL 7.1     Total Bilirubin 0.0 - 1.2 mg/dL 0.3     Alkaline Phos 44 - 121 IU/L 103   85      AST 0 - 40 IU/L 34   38      ALT 0 - 32 IU/L 43   39         This result is from an external source.   FINAL DIAGNOSIS        1. Breast, right, needle core biopsy, upper outer, x clip :       -  DUCTAL CARCINOMA IN SITU, INTERMEDIATE GRADE, WITH APOCRINE FEATURES AND       CALCIFICATIONS.     Imaging: Radiological images reviewed:  CLINICAL DATA:  RIGHT breast callback for calcifications with associated asymmetry   EXAM: DIGITAL DIAGNOSTIC UNILATERAL RIGHT MAMMOGRAM WITH TOMOSYNTHESIS AND CAD; ULTRASOUND RIGHT BREAST LIMITED   TECHNIQUE: Right digital diagnostic mammography and breast tomosynthesis was performed. The images were evaluated with computer-aided detection. ; Targeted ultrasound examination of the right breast was performed   COMPARISON:  Previous exam(s).   ACR Breast Density Category b:  There are scattered areas of fibroglandular density.   FINDINGS: Spot magnification views of the RIGHT breast demonstrate a 2.8 x 1.8 by 1.7 cm group of fine linear branching calcifications within an associated developing asymmetry. No additional findings are noted.   Targeted ultrasound was performed of the RIGHT breast. A discrete mass is not identified at the site of asymmetry/calcification concern.   Targeted ultrasound was performed of the RIGHT axilla. No suspicious axillary lymph nodes are visualized.   IMPRESSION: 1. There is a developing asymmetry with 2.8 cm of associated suspicious calcifications. Recommend stereotactic guided biopsy of the calcifications for definitive characterization. Complete surgical excision of the calcifications would be recommended with malignant results. 2. No suspicious RIGHT axillary adenopathy.   RECOMMENDATION: RIGHT breast stereotactic guided biopsy x1   I have discussed the findings and recommendations with the patient. The biopsy procedure was discussed with the patient and questions were answered. Patient expressed their understanding of the biopsy recommendation. Patient will be scheduled for biopsy at her earliest convenience by the schedulers. Ordering provider will be notified. If applicable, a reminder letter will be sent to the patient regarding the next appointment.   BI-RADS CATEGORY  4:  Suspicious.     Electronically Signed   By: Meda Klinefelter M.D.   On: 04/25/2023 15:13 Within last 24 hrs: No results found.  Assessment     Patient Active Problem List   Diagnosis Date Noted   Ductal carcinoma in situ (DCIS) of right breast 05/17/2023   Pap smear abnormality of cervix/human papillomavirus (HPV) positive 02/14/2021   Gastric polyp 03/22/2020   GERD (gastroesophageal reflux disease) 07/13/2017   Obstructive sleep apnea of adult 07/13/2017   IFG (impaired fasting glucose) 11/25/2014   Postmenopausal HRT  (hormone replacement therapy) 11/25/2014   HSV (herpes simplex virus) infection 11/05/2014   Allergic rhinitis 11/05/2014   Anxiety and depression 11/05/2014   Vitamin D deficiency 11/05/2014   HLD (hyperlipidemia) 07/23/2013   Adult hypothyroidism 07/23/2013    Plan    We will need to scout tags for to adequately bracket this area of microcalcifications. We will proceed with bracketed Scout tag localized right breast lumpectomy.  I discussed the available options with the patient. The risk of recurrence is similar between mastectomy and lumpectomy with radiation.  I also discussed that given the small size of the cancer would recommend localization lumpectomy with radiation to follow.   Explained to the patient that after her surgical treatment additional treatment or surgery will depend on her diagnosis, prognostic indicators and stage.    I discussed risks of bleeding, infection, damage to surrounding tissues, having positive margins, needing further resection, damage to nerves causing arm numbness or difficulty raising arm, causing lymphedema in the arm; as well as anesthesia risks of MI, stroke, prolonged ventilation, pulmonary embolism, thrombosis and even death.   Patient was given the opportunity to ask questions and have them answered.  They would like to proceed with right breast Scout tag localized lumpectomy.   Face-to-face time spent with the patient and accompanying care providers(if present) was 40 minutes, spent counseling, educating, and coordinating care of the patient.    These notes generated with voice recognition software. I apologize for typographical errors.  Campbell Lerner M.D., FACS 05/17/2023, 3:26 PM

## 2023-05-16 NOTE — Progress Notes (Unsigned)
 Patient ID: Dominique Kerr, female   DOB: [birth year], 63 y.o.   MRN: [id number]  Chief Complaint: DCIS right breast  History of Present Illness Dominique Kerr is a 63 y.o. female with an abnormal screening mammogram, showing an area of microcalcifications in the upper outer quadrant of the right breast.  She underwent stereotactic breast biopsy, and the marker was left at the posterior most calcifications.  Diagnosis of DCIS with ER positive.  She utilized birth control earlier in life and has now taking estrogen transdermally.  She is postmenopausal, and the estrogen has been helpful at controlling her symptoms.  She has no family history of breast cancer.  She began menstruating at the age of 89.  She has been pregnant once with a likely miscarriage.  She was 63 years old when she was pregnant she never breast-fed, never felt a breast lump, never had any discharge from her nipple, never noted any skin changes nor had any breast pain.  She presents today with her sister.   She sees oncology tomorrow.  Past Medical History Past Medical History:  Diagnosis Date   Allergy    Anemia    YEARS AGO WHILE HAVING PERIODS   Anxiety    Arthritis    Dysplastic nevus 06/15/2006   epigastric area - moderate   GERD (gastroesophageal reflux disease)    HSV (herpes simplex virus) infection    Hyperlipidemia    Hypertension    H/O LOST WEIGHT AND NO MEDS X 6 YRS    Obesity    PMDD (premenstrual dysphoric disorder)    Pre-diabetes    PRIOR TO GASTRIC SLEEVE   Sleep apnea    DOES NOT USE CPAP   Sleep apnea    Thyroid disease       Past Surgical History:  Procedure Laterality Date   BLADDER SURGERY Right 05/09/2023   rt breast stereo calcs asy x clip path pending   BREAST BIOPSY     BREAST BIOPSY Right 05/09/2023   MM RT BREAST BX W LOC DEV 1ST LESION IMAGE BX SPEC STEREO GUIDE 05/09/2023 [facility]   CHOLECYSTECTOMY     KNEE ARTHROSCOPY Left    KNEE ARTHROSCOPY Left 04/09/2020    Procedure: ARTHROSCOPY KNEE;  Surgeon: Donato Heinz, MD;  Location: [facility];  Service: Orthopedics;  Laterality: Left;   LAPAROSCOPIC GASTRIC SLEEVE RESECTION     parotid gland removal      Allergies  Allergen Reactions   Lisinopril Cough    Current Outpatient Medications  Medication Sig Dispense Refill   aspirin EC 81 MG tablet Take 81 mg by mouth daily.      atorvastatin (LIPITOR) 40 MG tablet Take 1 tablet (40 mg total) by mouth daily. 90 tablet 4   Azelastine HCl 137 MCG/SPRAY SOLN Place 2 sprays into both nostrils daily.     celecoxib (CELEBREX) 200 MG capsule Take 1 capsule (200 mg total) by mouth daily as needed for moderate pain. 30 capsule 12   Cholecalciferol (VITAMIN D) 50 MCG (2000 UT) tablet Take 2,000 Units by mouth daily.     diphenhydramine-acetaminophen (TYLENOL PM) 25-500 MG TABS tablet Take 2 tablets by mouth at bedtime as needed (sleep).     estradiol (CLIMARA - DOSED IN MG/24 HR) 0.05 mg/24hr patch Place 0.05 mg onto the skin every Friday.     levothyroxine (SYNTHROID) 125 MCG tablet TAKE 1 TABLET BY MOUTH EVERY DAY 90 tablet 2   Multiple Vitamin (MULTIVITAMIN WITH MINERALS) TABS tablet Take  1 tablet by mouth daily.     omeprazole (PRILOSEC) 20 MG capsule TAKE 1 CAPSULE BY MOUTH EVERY DAY 90 capsule 4   progesterone (PROMETRIUM) 200 MG capsule Take 200 mg by mouth daily.     sertraline (ZOLOFT) 100 MG tablet Take 1 tablet (100 mg total) by mouth daily. 90 tablet 4   triamcinolone cream (KENALOG) 0.1 % Apply 1 application topically 2 (two) times daily. 30 g 0   No current facility-administered medications for this visit.    Family History Family History  Problem Relation Age of Onset   Diabetes Mother    Hepatitis C Mother    Sleep apnea Father    Arthritis Sister    Diabetes Maternal Grandmother    Breast cancer Neg Hx       Social History Social History   Tobacco Use   Smoking status: Former    Current packs/day: 0.00    Average packs/day:  0.3 packs/day for 3.0 years (0.8 ttl pk-yrs)    Types: Cigarettes    Start date: 03/31/1997    Quit date: 03/31/2000    Years since quitting: 23.1   Smokeless tobacco: Never  Vaping Use   Vaping status: Never Used  Substance Use Topics   Alcohol use: Yes    Alcohol/week: 0.0 standard drinks of alcohol    Comment: RARELY   Drug use: No        Review of Systems  Constitutional: Negative.   HENT: Negative.    Eyes: Negative.   Respiratory:  Positive for cough.   Cardiovascular: Negative.   Gastrointestinal:  Positive for heartburn.  Genitourinary: Negative.   Skin: Negative.   Neurological: Negative.   Psychiatric/Behavioral: Negative.       Physical Exam Blood pressure (!) 166/89, pulse 80, temperature 98.6 F (37 C), temperature source Oral, height 5\' 6"  (1.676 m), weight 231 lb 9.6 oz (105.1 kg), SpO2 95%. Last Weight  Most recent update: 05/17/2023  2:55 PM    Weight  105.1 kg (231 lb 9.6 oz)             CONSTITUTIONAL: Well developed, and nourished, appropriately responsive and aware without distress.   EYES: Sclera non-icteric.   EARS, NOSE, MOUTH AND THROAT:  The oropharynx is clear. Oral mucosa is pink and moist.    Hearing is intact to voice.  NECK: Trachea is midline, and there is no jugular venous distension.  LYMPH NODES:  Lymph nodes in the neck are not appreciated. RESPIRATORY:  Lungs are clear, and breath sounds are equal bilaterally.  Normal respiratory effort without pathologic use of accessory muscles. CARDIOVASCULAR: Heart is regular in rate and rhythm.   Well perfused.  GI: The abdomen is well rounded,  soft, nontender, and nondistended. There were no palpable masses.  I did not appreciate hepatosplenomegaly.  MUSCULOSKELETAL:  Symmetrical muscle tone appreciated in all four extremities.    SKIN: Skin turgor is normal. No pathologic skin lesions appreciated.  NEUROLOGIC:  Motor and sensation appear grossly normal.  Cranial nerves are grossly without  defect. PSYCH:  Alert and oriented to person, place and time. Affect is appropriate for situation.  Data Reviewed I have personally reviewed what is currently available of the patient's imaging, recent labs and medical records.   Labs:     Latest Ref Rng & Units 02/24/2023    8:53 AM 08/18/2022   12:00 AM 08/20/2021    9:13 AM  CBC  WBC 3.4 - 10.8 x10E3/uL 5.3  4.8  4.4   Hemoglobin 11.1 - 15.9 g/dL 40.9  81.1     91.4   Hematocrit 34.0 - 46.6 % 37.1  37     37.0   Platelets 150 - 450 x10E3/uL 295  270     274      This result is from an external source.      Latest Ref Rng & Units 02/24/2023    8:53 AM 08/18/2022   12:00 AM 08/17/2022   12:00 AM  CMP  Glucose 70 - 99 mg/dL 782     BUN 8 - 27 mg/dL 12   15      Creatinine 0.57 - 1.00 mg/dL 9.56   0.7      Sodium 134 - 144 mmol/L 142   140      Potassium 3.5 - 5.2 mmol/L 4.2   4.4      Chloride 96 - 106 mmol/L 103   102      CO2 20 - 29 mmol/L 24   23      Calcium 8.7 - 10.3 mg/dL 9.5  8.9       Total Protein 6.0 - 8.5 g/dL 7.1     Total Bilirubin 0.0 - 1.2 mg/dL 0.3     Alkaline Phos 44 - 121 IU/L 103   85      AST 0 - 40 IU/L 34   38      ALT 0 - 32 IU/L 43   39         This result is from an external source.   FINAL DIAGNOSIS        1. Breast, right, needle core biopsy, upper outer, x clip :       -  DUCTAL CARCINOMA IN SITU, INTERMEDIATE GRADE, WITH APOCRINE FEATURES AND       CALCIFICATIONS.     Imaging: Radiological images reviewed:  CLINICAL DATA:  RIGHT breast callback for calcifications with associated asymmetry   EXAM: DIGITAL DIAGNOSTIC UNILATERAL RIGHT MAMMOGRAM WITH TOMOSYNTHESIS AND CAD; ULTRASOUND RIGHT BREAST LIMITED   TECHNIQUE: Right digital diagnostic mammography and breast tomosynthesis was performed. The images were evaluated with computer-aided detection. ; Targeted ultrasound examination of the right breast was performed   COMPARISON:  Previous exam(s).   ACR Breast Density Category b:  There are scattered areas of fibroglandular density.   FINDINGS: Spot magnification views of the RIGHT breast demonstrate a 2.8 x 1.8 by 1.7 cm group of fine linear branching calcifications within an associated developing asymmetry. No additional findings are noted.   Targeted ultrasound was performed of the RIGHT breast. A discrete mass is not identified at the site of asymmetry/calcification concern.   Targeted ultrasound was performed of the RIGHT axilla. No suspicious axillary lymph nodes are visualized.   IMPRESSION: 1. There is a developing asymmetry with 2.8 cm of associated suspicious calcifications. Recommend stereotactic guided biopsy of the calcifications for definitive characterization. Complete surgical excision of the calcifications would be recommended with malignant results. 2. No suspicious RIGHT axillary adenopathy.   RECOMMENDATION: RIGHT breast stereotactic guided biopsy x1   I have discussed the findings and recommendations with the patient. The biopsy procedure was discussed with the patient and questions were answered. Patient expressed their understanding of the biopsy recommendation. Patient will be scheduled for biopsy at her earliest convenience by the schedulers. Ordering provider will be notified. If applicable, a reminder letter will be sent to the patient regarding the next appointment.   BI-RADS CATEGORY  4:  Suspicious.     Electronically Signed   By: Meda Klinefelter M.D.   On: 04/25/2023 15:13 Within last 24 hrs: No results found.  Assessment     Patient Active Problem List   Diagnosis Date Noted   Ductal carcinoma in situ (DCIS) of right breast 05/17/2023   Pap smear abnormality of cervix/human papillomavirus (HPV) positive 02/14/2021   Gastric polyp 03/22/2020   GERD (gastroesophageal reflux disease) 07/13/2017   Obstructive sleep apnea of adult 07/13/2017   IFG (impaired fasting glucose) 11/25/2014   Postmenopausal HRT  (hormone replacement therapy) 11/25/2014   HSV (herpes simplex virus) infection 11/05/2014   Allergic rhinitis 11/05/2014   Anxiety and depression 11/05/2014   Vitamin D deficiency 11/05/2014   HLD (hyperlipidemia) 07/23/2013   Adult hypothyroidism 07/23/2013    Plan    We will need to scout tags for to adequately bracket this area of microcalcifications. We will proceed with bracketed Scout tag localized right breast lumpectomy.  I discussed the available options with the patient. The risk of recurrence is similar between mastectomy and lumpectomy with radiation.  I also discussed that given the small size of the cancer would recommend localization lumpectomy with radiation to follow.   Explained to the patient that after her surgical treatment additional treatment or surgery will depend on her diagnosis, prognostic indicators and stage.    I discussed risks of bleeding, infection, damage to surrounding tissues, having positive margins, needing further resection, damage to nerves causing arm numbness or difficulty raising arm, causing lymphedema in the arm; as well as anesthesia risks of MI, stroke, prolonged ventilation, pulmonary embolism, thrombosis and even death.   Patient was given the opportunity to ask questions and have them answered.  They would like to proceed with right breast Scout tag localized lumpectomy.   Face-to-face time spent with the patient and accompanying care providers(if present) was 40 minutes, spent counseling, educating, and coordinating care of the patient.    These notes generated with voice recognition software. I apologize for typographical errors.  Campbell Lerner M.D., FACS 05/17/2023, 3:26 PM

## 2023-05-17 ENCOUNTER — Ambulatory Visit: Payer: Self-pay | Admitting: Surgery

## 2023-05-17 ENCOUNTER — Ambulatory Visit: Admitting: Surgery

## 2023-05-17 ENCOUNTER — Encounter: Payer: Self-pay | Admitting: Surgery

## 2023-05-17 VITALS — BP 166/89 | HR 80 | Temp 98.6°F | Ht 66.0 in | Wt 231.6 lb

## 2023-05-17 DIAGNOSIS — D0511 Intraductal carcinoma in situ of right breast: Secondary | ICD-10-CM

## 2023-05-17 NOTE — Patient Instructions (Addendum)
 We have spoken today about removing a lump in your breast. This will be done by Dr. Claudine Mouton at [facility].  You will most likely be able to leave the hospital several hours after your surgery. Rarely, a patient needs to stay over night but this is a possibility.  Plan to tentatively be off work for 1-2 weeks following the surgery and may return with approximately 2 more weeks of a lifting restriction, no greater than 15 lbs.  Please see your Blue surgery sheet for more information. Our surgery scheduler will call you to look at surgery dates and to go over information.   If you have FMLA or Disability paperwork that needs to be filled out, please have your company fax your paperwork to [phone] or you may drop this by either office. This paperwork will be filled out within 3 days after your surgery has been completed.    Lumpectomy A lumpectomy is a form of "breast conserving" or "breast preservation" surgery. It may also be referred to as a partial mastectomy. During a lumpectomy, the portion of the breast that contains the cancerous tumor or breast mass (the lump) is removed. Some normal tissue around the lump may also be removed to make sure all of the tumor has been removed.  LET [facility] CARE PROVIDER KNOW ABOUT: Any allergies you have. All medicines you are taking, including vitamins, herbs, eye drops, creams, and over-the-counter medicines. Previous problems you or members of your family have had with the use of anesthetics. Any blood disorders you have. Previous surgeries you have had. Medical conditions you have. RISKS AND COMPLICATIONS Generally, this is a safe procedure. However, problems can occur and include: Bleeding. Infection. Pain. Temporary swelling. Change in the shape of the breast, particularly if a large portion is removed. BEFORE THE PROCEDURE Ask your health care provider about changing or stopping your regular medicines. This is especially important if you  are taking diabetes medicines or blood thinners. Do not eat or drink anything after midnight on the night before the procedure or as directed by your health care provider. Ask your health care provider if you can take a sip of water with any approved medicines. On the day of surgery, your health care provider will use a mammogram or ultrasound to locate and mark the tumor in your breast. These markings on your breast will show where the cut (incision) will be made.   PROCEDURE  An IV tube will be put into one of your veins. You may be given medicine to help you relax before the surgery (sedative). You will be given one of the following: A medicine that numbs the area (local anesthetic). A medicine that makes you fall asleep (general anesthetic). Your health care provider will use a kind of electric scalpel that uses heat to minimize bleeding (electrocautery knife). A curved incision (like a smile or frown) that follows the natural curve of your breast is made, to allow for minimal scarring and better healing. The tumor will be removed with some of the surrounding tissue. This will be sent to the lab for analysis. Your health care provider may also remove your lymph nodes at this time if needed. Sometimes, but not always, a rubber tube called a drain will be surgically inserted into your breast area or armpit to collect excess fluid that may accumulate in the space where the tumor was. This drain is connected to a plastic bulb on the outside of your body. This drain creates suction to  help remove the fluid. The incisions will be closed with stitches (sutures). A bandage may be placed over the incisions. AFTER THE PROCEDURE You will be taken to the recovery area. You will be given medicine for pain. A small rubber drain may be placed in the breast for 2-3 days to prevent a collection of blood (hematoma) from developing in the breast. You will be given instructions on caring for the drain before you  go home. A pressure bandage (dressing) will be applied for 1-2 days to prevent bleeding. Ask your health care provider how to care for your bandage at home.   This information is not intended to replace advice given to you by your health care provider. Make sure you discuss any questions you have with your health care provider.   Document Released: 03/22/2006 Document Revised: 03/01/2014 Document Reviewed: 07/14/2012 Elsevier Interactive Patient Education 2016 [employer] Inc[employer]    Ductal Carcinoma In Situ  Ductal carcinoma in situ is the presence of abnormal cells in the breast. It is the earliest form of breast cancer. The abnormal cells are located only in the tubes that carry milk to the nipple (milk ducts) and have not spread to other areas. What are the causes? The exact cause of ductal carcinoma in situ is unknown. What increases the risk? The following factors increase the risk of developing ductal carcinoma in situ: Being older than 61 years of age. Being female. Having a family history of breast cancer. Starting menopause after age 74. Starting your menstrual periods before age 2. Having never been pregnant or having your first child after age 79. Having never breastfed. A personal history of: Breast cancer. Dense breast tissue. Radiation treatments to the breasts or chest area. Having the BRCA1 and BRCA2 genes. Having certain types of noncancerous (benign) breast conditions, such as non-proliferative lesions, proliferative lesions with or without atypia (cell abnormalities), or lobular carcinoma in situ. Exposure to the drug DES, which was given to pregnant women from the 1940s to the 1970s. Other risks include: Current or past hormone use, such as: Using birth control. Taking hormone therapy after menopause. Being overweight or obese after menopause. Having an inactive (sedentary) lifestyle. Drinking more than one alcoholic beverage a day. What are the signs or  symptoms? Ductal carcinoma in situ does not cause any symptoms. How is this diagnosed? Ductal carcinoma in situ is usually discovered during a routine X-ray of the breasts to check for abnormal changes (mammogram). To diagnose the condition, your health care provider may do an ultrasound and remove a tissue sample from your breast so it can be examined under a microscope (breast biopsy). Your health care provider may also remove one or more lymph nodes from under your arm to check if the abnormal cells have spread to your lymph nodes (sentinel lymph node biopsy). Lymph nodes are part of the body's disease-fighting system (immune system). They are located throughout the body. The lymph nodes under the arms are usually the first place where abnormal cells spread. How is this treated? Treatment for this condition may include: A lumpectomy. This is surgery to remove the area of abnormal cells, along with a ring of normal tissue. This may also be called breast-conserving surgery. Simple mastectomy. This is surgery to remove breast tissue, the nipple, and the circle of colored tissue around the nipple (areola). Sometimes, one or more lymph nodes from under the arm are also removed and tested for cancer cells. Preventive mastectomy. This is the removal of both breasts. This  is usually done only if you have a very high risk of developing breast cancer. Radiation. This is the use of high-energy rays to kill cancer cells. Hormone therapy, which are medicines to keep the abnormal cells from spreading. Follow these instructions at home: Lifestyle Eat a healthy diet. A healthy diet includes lots of fruits and vegetables, low-fat dairy products, lean meats, and fiber. Make sure half your plate is filled with fruits or vegetables. Choose high-fiber foods such as whole-grain breads and cereals. Do not use any products that contain nicotine or tobacco. These products include cigarettes, chewing tobacco, and vaping  devices, such as e-cigarettes. If you need help quitting, ask your health care provider. Alcohol use Do not drink alcohol if: Your health care provider tells you not to drink. You are pregnant, may be pregnant, or are planning to become pregnant. If you drink alcohol: Limit how much you have to 0-1 drink a day. Know how much alcohol is in your drink. In the [place] one drink equals one 12 oz bottle of beer (355 mL), one 5 oz glass of wine (148 mL), or one 1 oz glass of hard liquor (44 mL). General instructions Take over-the-counter and prescription medicines only as told by your health care provider. Keep all follow-up visits. This is important. Where to find more information American Cancer Society: www.cancer.org National Cancer Institute: www.cancer.gov Contact a health care provider if: You have a fever. You notice a new lump in either breast or under your arm. You have any symptoms or changes that concern you. You notice new fatigue or weakness. Get help right away if: You have chest pain or trouble breathing. These symptoms may be an emergency. Get help right away. Call 911. Do not wait to see if the symptoms will go away. Do not drive yourself to the hospital. Summary Ductal carcinoma in situ is the presence of abnormal cells in the breast. It is the earliest form of breast cancer. The exact cause of ductal carcinoma in situ is unknown. Among other factors, the risk increases with age and with current or past hormone use. To diagnose the condition, your health care provider will remove a tissue sample from your breast so it can be examined under a microscope (breast biopsy). This information is not intended to replace advice given to you by your health care provider. Make sure you discuss any questions you have with your health care provider. Document Revised: 11/13/2020 Document Reviewed: 11/13/2020 Elsevier Patient Education  2024 [employer].

## 2023-05-18 ENCOUNTER — Inpatient Hospital Stay: Attending: Oncology | Admitting: Oncology

## 2023-05-18 ENCOUNTER — Encounter: Payer: Self-pay | Admitting: *Deleted

## 2023-05-18 ENCOUNTER — Encounter: Payer: Self-pay | Admitting: Oncology

## 2023-05-18 ENCOUNTER — Other Ambulatory Visit: Payer: Self-pay | Admitting: Surgery

## 2023-05-18 ENCOUNTER — Inpatient Hospital Stay

## 2023-05-18 VITALS — BP 134/79 | HR 85 | Temp 97.1°F | Resp 18 | Ht 66.0 in | Wt 231.5 lb

## 2023-05-18 DIAGNOSIS — I2510 Atherosclerotic heart disease of native coronary artery without angina pectoris: Secondary | ICD-10-CM | POA: Diagnosis not present

## 2023-05-18 DIAGNOSIS — Z9049 Acquired absence of other specified parts of digestive tract: Secondary | ICD-10-CM | POA: Insufficient documentation

## 2023-05-18 DIAGNOSIS — E785 Hyperlipidemia, unspecified: Secondary | ICD-10-CM | POA: Diagnosis not present

## 2023-05-18 DIAGNOSIS — Z8261 Family history of arthritis: Secondary | ICD-10-CM | POA: Insufficient documentation

## 2023-05-18 DIAGNOSIS — Z86018 Personal history of other benign neoplasm: Secondary | ICD-10-CM | POA: Diagnosis not present

## 2023-05-18 DIAGNOSIS — Z808 Family history of malignant neoplasm of other organs or systems: Secondary | ICD-10-CM | POA: Insufficient documentation

## 2023-05-18 DIAGNOSIS — G4733 Obstructive sleep apnea (adult) (pediatric): Secondary | ICD-10-CM | POA: Insufficient documentation

## 2023-05-18 DIAGNOSIS — Z809 Family history of malignant neoplasm, unspecified: Secondary | ICD-10-CM | POA: Insufficient documentation

## 2023-05-18 DIAGNOSIS — E039 Hypothyroidism, unspecified: Secondary | ICD-10-CM | POA: Diagnosis not present

## 2023-05-18 DIAGNOSIS — Z888 Allergy status to other drugs, medicaments and biological substances status: Secondary | ICD-10-CM | POA: Diagnosis not present

## 2023-05-18 DIAGNOSIS — Z8489 Family history of other specified conditions: Secondary | ICD-10-CM | POA: Insufficient documentation

## 2023-05-18 DIAGNOSIS — Z833 Family history of diabetes mellitus: Secondary | ICD-10-CM | POA: Diagnosis not present

## 2023-05-18 DIAGNOSIS — Z825 Family history of asthma and other chronic lower respiratory diseases: Secondary | ICD-10-CM | POA: Insufficient documentation

## 2023-05-18 DIAGNOSIS — Z79890 Hormone replacement therapy (postmenopausal): Secondary | ICD-10-CM | POA: Diagnosis not present

## 2023-05-18 DIAGNOSIS — F419 Anxiety disorder, unspecified: Secondary | ICD-10-CM | POA: Insufficient documentation

## 2023-05-18 DIAGNOSIS — D0511 Intraductal carcinoma in situ of right breast: Secondary | ICD-10-CM | POA: Diagnosis present

## 2023-05-18 DIAGNOSIS — I10 Essential (primary) hypertension: Secondary | ICD-10-CM | POA: Diagnosis not present

## 2023-05-18 DIAGNOSIS — Z87891 Personal history of nicotine dependence: Secondary | ICD-10-CM | POA: Diagnosis not present

## 2023-05-18 DIAGNOSIS — N6489 Other specified disorders of breast: Secondary | ICD-10-CM | POA: Diagnosis not present

## 2023-05-18 DIAGNOSIS — F32A Depression, unspecified: Secondary | ICD-10-CM | POA: Diagnosis not present

## 2023-05-18 DIAGNOSIS — Z79899 Other long term (current) drug therapy: Secondary | ICD-10-CM | POA: Insufficient documentation

## 2023-05-18 NOTE — Progress Notes (Signed)
 Accompanied patient and family to initial medical oncology appointment.   Reviewed Breast Cancer treatment handbook.   Care plan summary given to patient.   Reviewed outreach programs and cancer center services. She is going to decide if she would like genetic testing or not.

## 2023-05-19 ENCOUNTER — Encounter: Payer: Self-pay | Admitting: *Deleted

## 2023-05-19 ENCOUNTER — Telehealth: Payer: Self-pay | Admitting: Surgery

## 2023-05-19 DIAGNOSIS — D0511 Intraductal carcinoma in situ of right breast: Secondary | ICD-10-CM

## 2023-05-19 NOTE — Telephone Encounter (Signed)
 Patient has been advised of Pre-Admission date/time, and Surgery date at [facility].  Surgery Date: 06/01/23 Preadmission Testing Date: 05/23/23 (phone 8a-1p)  Patient has been made aware to call [phone], between 1-3:00pm the day before surgery, to find out what time to arrive for surgery.

## 2023-05-19 NOTE — Progress Notes (Signed)
 Lumpectomy is scheduled for 4/9.  She will see Dr. Smith Robert and Dr. Rushie Chestnut on 4/23.   Appt. Details given to her.

## 2023-05-21 NOTE — Progress Notes (Signed)
 Hematology/Oncology Consult note [facility] Telephone:([phone] [phone]  Patient Care Team: Marjie Skiff, NP as PCP - General (Nurse Practitioner) Hulen Luster, RN as Oncology Nurse Navigator   Name of the patient: Dominique Kerr  [id number]  [birth year]    Reason for referral-new diagnosis of DCIS   Referring physician-Jolene Harvest Dark, NP  Date of visit: 05/21/23   History of presenting illness- patient is a 63 year old female with a past medical history significant for hypothyroidism hypertension hyperlipidemia GERD among other medical Problems.  She underwent a screening mammogram in February 2025 which showed possible asymmetry with calcifications in the right breast.  This was followed by diagnostic mammogram and ultrasound which showed 2.8 x 1.8 x 1.7 cm fine linear branching calcifications in the right breast.  These were biopsied and was consistent with DCIS intermediate grade with apocrine features.  Tumor was 90% ER/PR positive.  Patient has met with Dr. Leonor Liv be planning the lumpectomy soon.  Menarche at the age of 30.  1 miscarriage.  She is G1 P1.  No family history of breast cancer.  She was using transdermal estrogen patch for hormone replacement therapy which she stopped after the diagnosis of DCIS.  ECOG PS- 0  Pain scale- 0   Review of systems- Review of Systems  Constitutional:  Negative for chills, fever, malaise/fatigue and weight loss.  HENT:  Negative for congestion, ear discharge and nosebleeds.   Eyes:  Negative for blurred vision.  Respiratory:  Negative for cough, hemoptysis, sputum production, shortness of breath and wheezing.   Cardiovascular:  Negative for chest pain, palpitations, orthopnea and claudication.  Gastrointestinal:  Negative for abdominal pain, blood in stool, constipation, diarrhea, heartburn, melena, nausea and vomiting.  Genitourinary:  Negative for dysuria, flank pain, frequency,  hematuria and urgency.  Musculoskeletal:  Negative for back pain, joint pain and myalgias.  Skin:  Negative for rash.  Neurological:  Negative for dizziness, tingling, focal weakness, seizures, weakness and headaches.  Endo/Heme/Allergies:  Does not bruise/bleed easily.  Psychiatric/Behavioral:  Negative for depression and suicidal ideas. The patient does not have insomnia.     Allergies  Allergen Reactions   Lisinopril Cough    Patient Active Problem List   Diagnosis Date Noted   Ductal carcinoma in situ (DCIS) of right breast 05/17/2023   Pap smear abnormality of cervix/human papillomavirus (HPV) positive 02/14/2021   Gastric polyp 03/22/2020   GERD (gastroesophageal reflux disease) 07/13/2017   Obstructive sleep apnea of adult 07/13/2017   IFG (impaired fasting glucose) 11/25/2014   Postmenopausal HRT (hormone replacement therapy) 11/25/2014   HSV (herpes simplex virus) infection 11/05/2014   Allergic rhinitis 11/05/2014   Anxiety and depression 11/05/2014   Vitamin D deficiency 11/05/2014   HLD (hyperlipidemia) 07/23/2013   Adult hypothyroidism 07/23/2013     Past Medical History:  Diagnosis Date   Allergy    Anemia    YEARS AGO WHILE HAVING PERIODS   Anxiety    Arthritis    Breast cancer (HCC)    Dysplastic nevus 06/15/2006   epigastric area - moderate   GERD (gastroesophageal reflux disease)    HSV (herpes simplex virus) infection    Hyperlipidemia    Hypertension    H/O LOST WEIGHT AND NO MEDS X 6 YRS    Obesity    PMDD (premenstrual dysphoric disorder)    Pre-diabetes    PRIOR TO GASTRIC SLEEVE   Sleep apnea    DOES NOT USE CPAP   Sleep apnea  Thyroid disease      Past Surgical History:  Procedure Laterality Date   BLADDER SURGERY Right 05/09/2023   rt breast stereo calcs asy x clip path pending   BREAST BIOPSY     BREAST BIOPSY Right 05/09/2023   MM RT BREAST BX W LOC DEV 1ST LESION IMAGE BX SPEC STEREO GUIDE 05/09/2023 [facility]    CHOLECYSTECTOMY     KNEE ARTHROSCOPY Left    KNEE ARTHROSCOPY Left 04/09/2020   Procedure: ARTHROSCOPY KNEE;  Surgeon: Donato Heinz, MD;  Location: [facility];  Service: Orthopedics;  Laterality: Left;   LAPAROSCOPIC GASTRIC SLEEVE RESECTION     parotid gland removal      Social History   Socioeconomic History   Marital status: Single    Spouse name: Not on file   Number of children: Not on file   Years of education: Not on file   Highest education level: 12th grade  Occupational History   Occupation: [employer]  Tobacco Use   Smoking status: Former    Current packs/day: 0.00    Average packs/day: 0.3 packs/day for 3.0 years (0.8 ttl pk-yrs)    Types: Cigarettes    Start date: 03/31/1997    Quit date: 03/31/2000    Years since quitting: 23.1   Smokeless tobacco: Former  Building services engineer status: Never Used  Substance and Sexual Activity   Alcohol use: Yes    Alcohol/week: 0.0 standard drinks of alcohol    Comment: RARELY   Drug use: No   Sexual activity: Not Currently  Other Topics Concern   Not on file  Social History Narrative   Not on file   Social Drivers of Health   Financial Resource Strain: Low Risk  (03/30/2023)   Received from [facility]   Overall Financial Resource Strain (CARDIA)    Difficulty of Paying Living Expenses: Not hard at all  Food Insecurity: No Food Insecurity (05/18/2023)   Hunger Vital Sign    Worried About Running Out of Food in the Last Year: Never true    Ran Out of Food in the Last Year: Never true  Transportation Needs: No Transportation Needs (05/18/2023)   PRAPARE - Administrator, Civil Service (Medical): No    Lack of Transportation (Non-Medical): No  Physical Activity: Insufficiently Active (02/20/2023)   Exercise Vital Sign    Days of Exercise per Week: 5 days    Minutes of Exercise per Session: 10 min  Stress: No Stress Concern Present (02/20/2023)   [employer] of  Occupational Health - Occupational Stress Questionnaire    Feeling of Stress : Only a little  Social Connections: Moderately Isolated (02/20/2023)   Social Connection and Isolation Panel [NHANES]    Frequency of Communication with Friends and Family: More than three times a week    Frequency of Social Gatherings with Friends and Family: More than three times a week    Attends Religious Services: 1 to 4 times per year    Active Member of [employer] or Organizations: No    Attends Banker Meetings: Not on file    Marital Status: Never married  Intimate Partner Violence: Not At Risk (05/18/2023)   Humiliation, Afraid, Rape, and Kick questionnaire    Fear of Current or Ex-Partner: No    Emotionally Abused: No    Physically Abused: No    Sexually Abused: No     Family History  Problem Relation Age of Onset  Diabetes Mother    Hepatitis C Mother    Sleep apnea Father    Arthritis Sister    Bone cancer Paternal Uncle    Throat cancer Paternal Uncle    Diabetes Maternal Grandmother    Cancer Maternal Grandfather    Breast cancer Neg Hx      Current Outpatient Medications:    aspirin EC 81 MG tablet, Take 81 mg by mouth daily. , Disp: , Rfl:    atorvastatin (LIPITOR) 40 MG tablet, Take 1 tablet (40 mg total) by mouth daily., Disp: 90 tablet, Rfl: 4   Azelastine HCl 137 MCG/SPRAY SOLN, Place 2 sprays into both nostrils daily., Disp: , Rfl:    celecoxib (CELEBREX) 200 MG capsule, Take 1 capsule (200 mg total) by mouth daily as needed for moderate pain., Disp: 30 capsule, Rfl: 12   Cholecalciferol (VITAMIN D) 50 MCG (2000 UT) tablet, Take 2,000 Units by mouth daily., Disp: , Rfl:    diphenhydramine-acetaminophen (TYLENOL PM) 25-500 MG TABS tablet, Take 2 tablets by mouth at bedtime as needed (sleep)., Disp: , Rfl:    fluticasone (FLONASE) 50 MCG/ACT nasal spray, Place 2 sprays into both nostrils daily as needed for allergies or rhinitis., Disp: , Rfl:    levothyroxine (SYNTHROID)  125 MCG tablet, TAKE 1 TABLET BY MOUTH EVERY DAY, Disp: 90 tablet, Rfl: 2   Multiple Vitamin (MULTIVITAMIN WITH MINERALS) TABS tablet, Take 1 tablet by mouth daily., Disp: , Rfl:    NON FORMULARY, Pt uses a cpap nightly, Disp: , Rfl:    omeprazole (PRILOSEC) 20 MG capsule, TAKE 1 CAPSULE BY MOUTH EVERY DAY, Disp: 90 capsule, Rfl: 4   sertraline (ZOLOFT) 100 MG tablet, Take 1 tablet (100 mg total) by mouth daily., Disp: 90 tablet, Rfl: 4   triamcinolone cream (KENALOG) 0.1 %, Apply 1 application topically 2 (two) times daily. (Patient not taking: Reported on 05/19/2023), Disp: 30 g, Rfl: 0   Physical exam:  Vitals:   05/18/23 1440  BP: 134/79  Pulse: 85  Resp: 18  Temp: (!) 97.1 F (36.2 C)  TempSrc: Tympanic  Weight: 231 lb 8 oz (105 kg)  Height: 5\' 6"  (1.676 m)   Physical Exam Cardiovascular:     Rate and Rhythm: Normal rate and regular rhythm.     Heart sounds: Normal heart sounds.  Pulmonary:     Effort: Pulmonary effort is normal.     Breath sounds: Normal breath sounds.  Abdominal:     General: Bowel sounds are normal.     Palpations: Abdomen is soft.  Skin:    General: Skin is warm and dry.  Neurological:     Mental Status: She is alert and oriented to person, place, and time.   Breast exam: Performed in sitting and lying down position.  No palpable masses in either breast.  No palpable bilateral axillary adenopathy.       Latest Ref Rng & Units 02/24/2023    8:53 AM  CMP  Glucose 70 - 99 mg/dL 147   BUN 8 - 27 mg/dL 12   Creatinine 8.29 - 1.00 mg/dL 5.62   Sodium 130 - 865 mmol/L 142   Potassium 3.5 - 5.2 mmol/L 4.2   Chloride 96 - 106 mmol/L 103   CO2 20 - 29 mmol/L 24   Calcium 8.7 - 10.3 mg/dL 9.5   Total Protein 6.0 - 8.5 g/dL 7.1   Total Bilirubin 0.0 - 1.2 mg/dL 0.3   Alkaline Phos 44 - 121 IU/L 103  AST 0 - 40 IU/L 34   ALT 0 - 32 IU/L 43       Latest Ref Rng & Units 02/24/2023    8:53 AM  CBC  WBC 3.4 - 10.8 x10E3/uL 5.3   Hemoglobin 11.1 -  15.9 g/dL 19.1   Hematocrit 47.8 - 46.6 % 37.1   Platelets 150 - 450 x10E3/uL 295       MM RT BREAST BX W LOC DEV 1ST LESION IMAGE BX SPEC STEREO GUIDE Addendum Date: 05/16/2023 ADDENDUM REPORT: 05/16/2023 14:44 ADDENDUM: PATHOLOGY revealed: 1. Breast, right, needle core biopsy, upper outer, x clip : - DUCTAL CARCINOMA IN SITU, INTERMEDIATE GRADE, WITH APOCRINE FEATURES AND CALCIFICATIONS. Pathology results are CONCORDANT with imaging findings, per Dr. Emmaline Kluver. Pathology results and recommendations were discussed with patient via telephone on 05/10/2023 by Randa Lynn RN. Patient reported biopsy site doing well with no adverse symptoms, and only slight tenderness at the site. Post biopsy care instructions were reviewed, questions were answered and my direct phone number was provided. Patient was instructed to call [facility] for any additional questions or concerns related to biopsy site. RECOMMENDATIONS: 1. Surgical and oncological consultation. Request for surgical and oncological consultation relayed to Irving Shows RN at [facility] by Randa Lynn RN on 05/10/2023. Pathology results reported by Randa Lynn RN on 05/11/2023. Electronically Signed   By: Emmaline Kluver M.D.   On: 05/16/2023 14:44   Result Date: 05/16/2023 CLINICAL DATA:  61-year-old female presenting for biopsy of calcifications with asymmetry in the upper outer right breast. EXAM: RIGHT BREAST STEREOTACTIC CORE NEEDLE BIOPSY COMPARISON:  Previous exam(s). FINDINGS: The patient and I discussed the procedure of stereotactic-guided biopsy including benefits and alternatives. We discussed the high likelihood of a successful procedure. We discussed the risks of the procedure including infection, bleeding, tissue injury, clip migration, and inadequate sampling. Informed written consent was given. The usual time out protocol was performed immediately prior to the procedure. Using sterile technique and 1%  Lidocaine as local anesthetic, under stereotactic guidance, a 9 gauge vacuum assisted device was used to perform core needle biopsy of calcifications with asymmetry in the upper outer right breast using a superior approach. Specimen radiograph was performed showing several specimens with calcifications. Specimens with calcifications are identified for pathology. Lesion quadrant: Upper outer quadrant At the conclusion of the procedure, an X shaped tissue marker clip was deployed into the biopsy cavity. Follow-up 2-view mammogram was performed and dictated separately. IMPRESSION: Stereotactic-guided biopsy of calcifications with asymmetry in the upper outer right breast. No apparent complications. Electronically Signed: By: Emmaline Kluver M.D. On: 05/09/2023 08:42   MM CLIP PLACEMENT RIGHT Result Date: 05/09/2023 CLINICAL DATA:  Post procedure mammogram for clip placement EXAM: 3D DIAGNOSTIC RIGHT MAMMOGRAM POST STEREOTACTIC BIOPSY COMPARISON:  Previous exam(s). ACR Breast Density Category b: There are scattered areas of fibroglandular density. FINDINGS: 3D Mammographic images were obtained following stereotactic guided biopsy of calcifications with asymmetry in the upper outer right breast. The X biopsy marking clip is in expected position at the site of biopsy, at the posterior aspect of the calcifications. IMPRESSION: Appropriate positioning of the X marking clip at the site of biopsy in the upper outer right breast. Please note the biopsy marking clip is located at the posterior aspect of the calcifications. If the biopsy demonstrates atypia or malignancy recommend bracketed localization. Final Assessment: Post Procedure Mammograms for Marker Placement Electronically Signed   By: Emmaline Kluver M.D.   On: 05/09/2023  08:42   MM 3D DIAGNOSTIC MAMMOGRAM UNILATERAL RIGHT BREAST Result Date: 04/25/2023 CLINICAL DATA:  RIGHT breast callback for calcifications with associated asymmetry EXAM: DIGITAL DIAGNOSTIC  UNILATERAL RIGHT MAMMOGRAM WITH TOMOSYNTHESIS AND CAD; ULTRASOUND RIGHT BREAST LIMITED TECHNIQUE: Right digital diagnostic mammography and breast tomosynthesis was performed. The images were evaluated with computer-aided detection. ; Targeted ultrasound examination of the right breast was performed COMPARISON:  Previous exam(s). ACR Breast Density Category b: There are scattered areas of fibroglandular density. FINDINGS: Spot magnification views of the RIGHT breast demonstrate a 2.8 x 1.8 by 1.7 cm group of fine linear branching calcifications within an associated developing asymmetry. No additional findings are noted. Targeted ultrasound was performed of the RIGHT breast. A discrete mass is not identified at the site of asymmetry/calcification concern. Targeted ultrasound was performed of the RIGHT axilla. No suspicious axillary lymph nodes are visualized. IMPRESSION: 1. There is a developing asymmetry with 2.8 cm of associated suspicious calcifications. Recommend stereotactic guided biopsy of the calcifications for definitive characterization. Complete surgical excision of the calcifications would be recommended with malignant results. 2. No suspicious RIGHT axillary adenopathy. RECOMMENDATION: RIGHT breast stereotactic guided biopsy x1 I have discussed the findings and recommendations with the patient. The biopsy procedure was discussed with the patient and questions were answered. Patient expressed their understanding of the biopsy recommendation. Patient will be scheduled for biopsy at her earliest convenience by the schedulers. Ordering provider will be notified. If applicable, a reminder letter will be sent to the patient regarding the next appointment. BI-RADS CATEGORY  4: Suspicious. Electronically Signed   By: Meda Klinefelter M.D.   On: 04/25/2023 15:13   [place] LIMITED ULTRASOUND INCLUDING AXILLA RIGHT BREAST Result Date: 04/25/2023 CLINICAL DATA:  RIGHT breast callback for calcifications with associated  asymmetry EXAM: DIGITAL DIAGNOSTIC UNILATERAL RIGHT MAMMOGRAM WITH TOMOSYNTHESIS AND CAD; ULTRASOUND RIGHT BREAST LIMITED TECHNIQUE: Right digital diagnostic mammography and breast tomosynthesis was performed. The images were evaluated with computer-aided detection. ; Targeted ultrasound examination of the right breast was performed COMPARISON:  Previous exam(s). ACR Breast Density Category b: There are scattered areas of fibroglandular density. FINDINGS: Spot magnification views of the RIGHT breast demonstrate a 2.8 x 1.8 by 1.7 cm group of fine linear branching calcifications within an associated developing asymmetry. No additional findings are noted. Targeted ultrasound was performed of the RIGHT breast. A discrete mass is not identified at the site of asymmetry/calcification concern. Targeted ultrasound was performed of the RIGHT axilla. No suspicious axillary lymph nodes are visualized. IMPRESSION: 1. There is a developing asymmetry with 2.8 cm of associated suspicious calcifications. Recommend stereotactic guided biopsy of the calcifications for definitive characterization. Complete surgical excision of the calcifications would be recommended with malignant results. 2. No suspicious RIGHT axillary adenopathy. RECOMMENDATION: RIGHT breast stereotactic guided biopsy x1 I have discussed the findings and recommendations with the patient. The biopsy procedure was discussed with the patient and questions were answered. Patient expressed their understanding of the biopsy recommendation. Patient will be scheduled for biopsy at her earliest convenience by the schedulers. Ordering provider will be notified. If applicable, a reminder letter will be sent to the patient regarding the next appointment. BI-RADS CATEGORY  4: Suspicious. Electronically Signed   By: Meda Klinefelter M.D.   On: 04/25/2023 15:13    Assessment and plan- Patient is a 63 y.o. female referred for right breast DCIS ER positive  Discussed that  standard of care for DCIS that is ER positive would be upfront lumpectomy followed by consideration for adjuvant  radiation.  Ideally we are looking for 2 mm negative margins at the time of DCIS.  If margins are close or positive consideration will need to be given for repeat surgery versus radiation boost.  In rare situations invasive cancer may be found at the time of DCIS.  Purely for DCIS there is no role for adjuvant chemotherapy.  Given that she has ER positive DCIS there would be role for adjuvant endocrine therapy which I will discuss with her in greater detail down the line.  Treatment will be given with a curative intent.  I will see her back after final pathology results are back along with radiation oncology.    Thank you for this kind referral and the opportunity to participate in the care of this patient   Visit Diagnosis 1. Ductal carcinoma in situ (DCIS) of right breast     Dr. Owens Shark, MD, MPH [facility] at [facility] [phone] 05/21/2023

## 2023-05-23 ENCOUNTER — Encounter
Admission: RE | Admit: 2023-05-23 | Discharge: 2023-05-23 | Disposition: A | Discharge status: Home / Self Care | Source: Ambulatory Visit | Attending: Surgery | Admitting: Surgery

## 2023-05-23 ENCOUNTER — Other Ambulatory Visit: Payer: Self-pay

## 2023-05-23 HISTORY — DX: Intraductal carcinoma in situ of unspecified breast: D05.10

## 2023-05-23 HISTORY — DX: Hypothyroidism, unspecified: E03.9

## 2023-05-23 HISTORY — DX: Polyp of stomach and duodenum: K31.7

## 2023-05-23 HISTORY — DX: Vitamin D deficiency, unspecified: E55.9

## 2023-05-23 HISTORY — DX: Obstructive sleep apnea (adult) (pediatric): G47.33

## 2023-05-23 NOTE — Patient Instructions (Addendum)
 Your procedure is scheduled on:06-01-23 Wednesday Report to the Registration Desk on the 1st floor of the [facility].Then proceed to the 2nd floor Surgery Desk To find out your arrival time, please call [phone] between 1PM - 3PM on:05-31-23 Tuesday If your arrival time is 6:00 am, do not arrive before that time as the [facility] entrance doors do not open until 6:00 am.  REMEMBER: Instructions that are not followed completely may result in serious medical risk, up to and including death; or upon the discretion of your surgeon and anesthesiologist your surgery may need to be rescheduled.  Do not eat food OR drink any liquids after midnight the night before surgery.  No gum chewing or hard candies.  One week prior to surgery:Last dose will be on 05-24-23 Stop Anti-inflammatories (NSAIDS) such as Advil, Aleve, Ibuprofen, Motrin, Naproxen, Naprosyn and Aspirin based products such as Excedrin, Goody's Powder, BC Powder.You may continue your celecoxib (CELEBREX) up until the day prior to surgery if needed Stop ANY OVER THE COUNTER supplements until after surgery (Vitamin D, Multivitamin)  You may however, continue to take Tylenol if needed for pain up until the day of surgery.  Stop your 81 mg Aspirin 7 days prior to surgery-Last dose will be on 05-24-23 Tuesday  Continue taking all of your other prescription medications up until the day of surgery.  ON THE DAY OF SURGERY ONLY TAKE THESE MEDICATIONS WITH SIPS OF WATER: -levothyroxine (SYNTHROID)  -omeprazole (PRILOSEC)  -sertraline (ZOLOFT)   No Alcohol for 24 hours before or after surgery.  No Smoking including e-cigarettes for 24 hours before surgery.  No chewable tobacco products for at least 6 hours before surgery.  No nicotine patches on the day of surgery.  Do not use any "recreational" drugs for at least a week (preferably 2 weeks) before your surgery.  Please be advised that the combination of cocaine and anesthesia may have  negative outcomes, up to and including death. If you test positive for cocaine, your surgery will be cancelled.  On the morning of surgery brush your teeth with toothpaste and water, you may rinse your mouth with mouthwash if you wish. Do not swallow any toothpaste or mouthwash.  Use CHG Soap as directed on instruction sheet.  Do not wear jewelry, make-up, hairpins, clips or nail polish.  For welded (permanent) jewelry: bracelets, anklets, waist bands, etc.  Please have this removed prior to surgery.  If it is not removed, there is a chance that hospital personnel will need to cut it off on the day of surgery.  Do not wear lotions, powders, or perfumes.   Do not shave body hair from the neck down 48 hours before surgery.  Contact lenses, hearing aids and dentures may not be worn into surgery.  Do not bring valuables to the hospital. [facility] is not responsible for any missing/lost belongings or valuables.   Bring your C-PAP to the hospital   Notify your doctor if there is any change in your medical condition (cold, fever, infection).  Wear comfortable clothing (specific to your surgery type) to the hospital.  After surgery, you can help prevent lung complications by doing breathing exercises.  Take deep breaths and cough every 1-2 hours. Your doctor may order a device called an Incentive Spirometer to help you take deep breaths. When coughing or sneezing, hold a pillow firmly against your incision with both hands. This is called "splinting." Doing this helps protect your incision. It also decreases belly discomfort.  If  you are being admitted to the hospital overnight, leave your suitcase in the car. After surgery it may be brought to your room.  In case of increased patient census, it may be necessary for you, the patient, to continue your postoperative care in the Same Day Surgery department.  If you are being discharged the day of surgery, you will not be allowed to drive  home. You will need a responsible individual to drive you home and stay with you for 24 hours after surgery.   If you are taking public transportation, you will need to have a responsible individual with you.  Please call the Pre-admissions Testing Dept. at [phone] if you have any questions about these instructions.  Surgery Visitation Policy:  Patients having surgery or a procedure may have two visitors.  Children under the age of 27 must have an adult with them who is not the patient.     Preparing for Surgery with CHLORHEXIDINE GLUCONATE (CHG) Soap  Chlorhexidine Gluconate (CHG) Soap  o An antiseptic cleaner that kills germs and bonds with the skin to continue killing germs even after washing  o Used for showering the night before surgery and morning of surgery  Before surgery, you can play an important role by reducing the number of germs on your skin.  CHG (Chlorhexidine gluconate) soap is an antiseptic cleanser which kills germs and bonds with the skin to continue killing germs even after washing.  Please do not use if you have an allergy to CHG or antibacterial soaps. If your skin becomes reddened/irritated stop using the CHG.  1. Shower the NIGHT BEFORE SURGERY and the MORNING OF SURGERY with CHG soap.  2. If you choose to wash your hair, wash your hair first as usual with your normal shampoo.  3. After shampooing, rinse your hair and body thoroughly to remove the shampoo.  4. Use CHG as you would any other liquid soap. You can apply CHG directly to the skin and wash gently with a scrungie or a clean washcloth.  5. Apply the CHG soap to your body only from the neck down. Do not use on open wounds or open sores. Avoid contact with your eyes, ears, mouth, and genitals (private parts). Wash face and genitals (private parts) with your normal soap.  6. Wash thoroughly, paying special attention to the area where your surgery will be performed.  7. Thoroughly rinse  your body with warm water.  8. Do not shower/wash with your normal soap after using and rinsing off the CHG soap.  9. Pat yourself dry with a clean towel.  10. Wear clean pajamas to bed the night before surgery.  12. Place clean sheets on your bed the night of your first shower and do not sleep with pets.  13. Shower again with the CHG soap on the day of surgery prior to arriving at the hospital.  14. Do not apply any deodorants/lotions/powders.  15. Please wear clean clothes to the hospital.

## 2023-05-25 ENCOUNTER — Ambulatory Visit
Admission: RE | Admit: 2023-05-25 | Discharge: 2023-05-25 | Disposition: A | Discharge status: Home / Self Care | Source: Ambulatory Visit | Attending: Surgery | Admitting: Surgery

## 2023-05-25 DIAGNOSIS — D0511 Intraductal carcinoma in situ of right breast: Secondary | ICD-10-CM | POA: Diagnosis present

## 2023-05-25 HISTORY — PX: BREAST BIOPSY: SHX20

## 2023-05-25 MED ORDER — LIDOCAINE HCL 1 % IJ SOLN
10.0000 mL | Freq: Once | INTRAMUSCULAR | Status: AC
  Administered 2023-05-25: 10 mL

## 2023-05-25 MED ORDER — LIDOCAINE HCL 1 % IJ SOLN
10.0000 mL | Freq: Once | INTRAMUSCULAR | Status: AC
  Administered 2023-05-25: 10 mL
  Filled 2023-05-25: qty 10

## 2023-06-01 ENCOUNTER — Encounter
Admission: RE | Disposition: A | Discharge status: Home / Self Care | Payer: Self-pay | Source: Home / Self Care | Attending: Surgery

## 2023-06-01 ENCOUNTER — Ambulatory Visit: Admitting: Anesthesiology

## 2023-06-01 ENCOUNTER — Encounter: Payer: Self-pay | Admitting: *Deleted

## 2023-06-01 ENCOUNTER — Other Ambulatory Visit: Payer: Self-pay

## 2023-06-01 ENCOUNTER — Encounter: Payer: Self-pay | Admitting: Surgery

## 2023-06-01 ENCOUNTER — Ambulatory Visit
Admission: RE | Admit: 2023-06-01 | Discharge: 2023-06-01 | Disposition: A | Discharge status: Home / Self Care | Attending: Surgery | Admitting: Surgery

## 2023-06-01 ENCOUNTER — Ambulatory Visit
Admission: RE | Admit: 2023-06-01 | Discharge: 2023-06-01 | Disposition: A | Discharge status: Home / Self Care | Source: Ambulatory Visit | Attending: Surgery | Admitting: Surgery

## 2023-06-01 DIAGNOSIS — Z6837 Body mass index (BMI) 37.0-37.9, adult: Secondary | ICD-10-CM | POA: Insufficient documentation

## 2023-06-01 DIAGNOSIS — G4733 Obstructive sleep apnea (adult) (pediatric): Secondary | ICD-10-CM | POA: Diagnosis not present

## 2023-06-01 DIAGNOSIS — I10 Essential (primary) hypertension: Secondary | ICD-10-CM | POA: Diagnosis not present

## 2023-06-01 DIAGNOSIS — K219 Gastro-esophageal reflux disease without esophagitis: Secondary | ICD-10-CM | POA: Insufficient documentation

## 2023-06-01 DIAGNOSIS — D0511 Intraductal carcinoma in situ of right breast: Secondary | ICD-10-CM | POA: Diagnosis present

## 2023-06-01 DIAGNOSIS — Z170 Estrogen receptor positive status [ER+]: Secondary | ICD-10-CM | POA: Insufficient documentation

## 2023-06-01 DIAGNOSIS — E669 Obesity, unspecified: Secondary | ICD-10-CM | POA: Diagnosis not present

## 2023-06-01 DIAGNOSIS — Z87891 Personal history of nicotine dependence: Secondary | ICD-10-CM | POA: Diagnosis not present

## 2023-06-01 HISTORY — PX: BREAST LUMPECTOMY WITH RADIO FREQUENCY LOCALIZER: SHX6897

## 2023-06-01 SURGERY — BREAST LUMPECTOMY WITH RADIO FREQUENCY LOCALIZER
Anesthesia: General | Site: Breast | Laterality: Right

## 2023-06-01 MED ORDER — MIDAZOLAM HCL 2 MG/2ML IJ SOLN
INTRAMUSCULAR | Status: AC
  Filled 2023-06-01: qty 2

## 2023-06-01 MED ORDER — MIDAZOLAM HCL 2 MG/2ML IJ SOLN
INTRAMUSCULAR | Status: DC | PRN
  Administered 2023-06-01: 2 mg via INTRAVENOUS

## 2023-06-01 MED ORDER — STERILE WATER FOR IRRIGATION IR SOLN
Status: DC | PRN
  Administered 2023-06-01: 500 mL

## 2023-06-01 MED ORDER — OXYCODONE HCL 5 MG PO TABS: 5.0000 mg | ORAL_TABLET | Freq: Once | ORAL | Status: DC | PRN

## 2023-06-01 MED ORDER — CHLORHEXIDINE GLUCONATE 0.12 % MT SOLN
OROMUCOSAL | Status: AC
  Filled 2023-06-01: qty 15

## 2023-06-01 MED ORDER — PROPOFOL 10 MG/ML IV BOLUS
INTRAVENOUS | Status: DC | PRN
  Administered 2023-06-01: 150 mg via INTRAVENOUS

## 2023-06-01 MED ORDER — ONDANSETRON HCL 4 MG/2ML IJ SOLN: 4.0000 mg | Freq: Once | INTRAMUSCULAR | Status: DC | PRN

## 2023-06-01 MED ORDER — LIDOCAINE HCL (PF) 2 % IJ SOLN
INTRAMUSCULAR | Status: AC
  Filled 2023-06-01: qty 5

## 2023-06-01 MED ORDER — PROPOFOL 10 MG/ML IV BOLUS
INTRAVENOUS | Status: AC
  Filled 2023-06-01: qty 20

## 2023-06-01 MED ORDER — LACTATED RINGERS IV SOLN: INTRAVENOUS | Status: DC

## 2023-06-01 MED ORDER — FENTANYL CITRATE (PF) 100 MCG/2ML IJ SOLN
INTRAMUSCULAR | Status: DC | PRN
  Administered 2023-06-01: 50 ug via INTRAVENOUS

## 2023-06-01 MED ORDER — PHENYLEPHRINE HCL-NACL 20-0.9 MG/250ML-% IV SOLN
INTRAVENOUS | Status: AC
  Filled 2023-06-01: qty 250

## 2023-06-01 MED ORDER — CHLORHEXIDINE GLUCONATE 0.12 % MT SOLN
15.0000 mL | Freq: Once | OROMUCOSAL | Status: AC
  Administered 2023-06-01: 15 mL via OROMUCOSAL

## 2023-06-01 MED ORDER — CELECOXIB 200 MG PO CAPS
ORAL_CAPSULE | ORAL | Status: AC
  Filled 2023-06-01: qty 1

## 2023-06-01 MED ORDER — DEXAMETHASONE SODIUM PHOSPHATE 10 MG/ML IJ SOLN
INTRAMUSCULAR | Status: DC | PRN
  Administered 2023-06-01: 10 mg via INTRAVENOUS

## 2023-06-01 MED ORDER — CELECOXIB 200 MG PO CAPS
200.0000 mg | ORAL_CAPSULE | ORAL | Status: AC
  Administered 2023-06-01: 200 mg via ORAL

## 2023-06-01 MED ORDER — PHENYLEPHRINE 80 MCG/ML (10ML) SYRINGE FOR IV PUSH (FOR BLOOD PRESSURE SUPPORT)
PREFILLED_SYRINGE | INTRAVENOUS | Status: AC
  Filled 2023-06-01: qty 10

## 2023-06-01 MED ORDER — CHLORHEXIDINE GLUCONATE CLOTH 2 % EX PADS
6.0000 | MEDICATED_PAD | Freq: Once | CUTANEOUS | Status: AC
  Administered 2023-06-01: 6 via TOPICAL

## 2023-06-01 MED ORDER — OXYCODONE HCL 5 MG/5ML PO SOLN: 5.0000 mg | Freq: Once | ORAL | Status: DC | PRN

## 2023-06-01 MED ORDER — DEXAMETHASONE SODIUM PHOSPHATE 10 MG/ML IJ SOLN
INTRAMUSCULAR | Status: AC
  Filled 2023-06-01: qty 1

## 2023-06-01 MED ORDER — EPHEDRINE SULFATE-NACL 50-0.9 MG/10ML-% IV SOSY
PREFILLED_SYRINGE | INTRAVENOUS | Status: DC | PRN
  Administered 2023-06-01: 10 mg via INTRAVENOUS
  Administered 2023-06-01: 5 mg via INTRAVENOUS

## 2023-06-01 MED ORDER — CEFAZOLIN SODIUM-DEXTROSE 2-4 GM/100ML-% IV SOLN
2.0000 g | INTRAVENOUS | Status: AC
  Administered 2023-06-01: 2 g via INTRAVENOUS

## 2023-06-01 MED ORDER — BUPIVACAINE LIPOSOME 1.3 % IJ SUSP
INTRAMUSCULAR | Status: AC
  Filled 2023-06-01: qty 20

## 2023-06-01 MED ORDER — CEFAZOLIN SODIUM-DEXTROSE 2-4 GM/100ML-% IV SOLN
INTRAVENOUS | Status: AC
  Filled 2023-06-01: qty 100

## 2023-06-01 MED ORDER — BUPIVACAINE LIPOSOME 1.3 % IJ SUSP: 20.0000 mL | Freq: Once | INTRAMUSCULAR | Status: DC

## 2023-06-01 MED ORDER — ORAL CARE MOUTH RINSE: 15.0000 mL | Freq: Once | OROMUCOSAL | Status: AC

## 2023-06-01 MED ORDER — GABAPENTIN 300 MG PO CAPS
300.0000 mg | ORAL_CAPSULE | ORAL | Status: AC
  Administered 2023-06-01: 300 mg via ORAL

## 2023-06-01 MED ORDER — PHENYLEPHRINE 80 MCG/ML (10ML) SYRINGE FOR IV PUSH (FOR BLOOD PRESSURE SUPPORT)
PREFILLED_SYRINGE | INTRAVENOUS | Status: DC | PRN
  Administered 2023-06-01 (×2): 80 ug via INTRAVENOUS

## 2023-06-01 MED ORDER — FENTANYL CITRATE (PF) 100 MCG/2ML IJ SOLN
25.0000 ug | INTRAMUSCULAR | Status: DC | PRN
Start: 2023-06-01 — End: 2023-06-01

## 2023-06-01 MED ORDER — BUPIVACAINE-EPINEPHRINE (PF) 0.25% -1:200000 IJ SOLN
INTRAMUSCULAR | Status: AC
  Filled 2023-06-01: qty 30

## 2023-06-01 MED ORDER — BUPIVACAINE-EPINEPHRINE (PF) 0.25% -1:200000 IJ SOLN
INTRAMUSCULAR | Status: DC | PRN
  Administered 2023-06-01: 30 mL

## 2023-06-01 MED ORDER — EPHEDRINE 5 MG/ML INJ
INTRAVENOUS | Status: AC
  Filled 2023-06-01: qty 5

## 2023-06-01 MED ORDER — GABAPENTIN 300 MG PO CAPS
ORAL_CAPSULE | ORAL | Status: AC
  Filled 2023-06-01: qty 1

## 2023-06-01 MED ORDER — ACETAMINOPHEN 500 MG PO TABS
ORAL_TABLET | ORAL | Status: AC
  Filled 2023-06-01: qty 2

## 2023-06-01 MED ORDER — FENTANYL CITRATE (PF) 100 MCG/2ML IJ SOLN
INTRAMUSCULAR | Status: AC
  Filled 2023-06-01: qty 2

## 2023-06-01 MED ORDER — LIDOCAINE HCL (CARDIAC) PF 100 MG/5ML IV SOSY
PREFILLED_SYRINGE | INTRAVENOUS | Status: DC | PRN
  Administered 2023-06-01: 100 mg via INTRAVENOUS

## 2023-06-01 MED ORDER — HYDROCODONE-ACETAMINOPHEN 5-325 MG PO TABS: 1.0000 | ORAL_TABLET | Freq: Four times a day (QID) | ORAL | 0 refills | Status: DC | PRN

## 2023-06-01 MED ORDER — ACETAMINOPHEN 500 MG PO TABS
1000.0000 mg | ORAL_TABLET | ORAL | Status: AC
  Administered 2023-06-01: 1000 mg via ORAL

## 2023-06-01 MED ORDER — PHENYLEPHRINE HCL-NACL 20-0.9 MG/250ML-% IV SOLN
INTRAVENOUS | Status: DC | PRN
  Administered 2023-06-01: 20 ug/min via INTRAVENOUS

## 2023-06-01 SURGICAL SUPPLY — 32 items
APPLIER CLIP 9.375 SM OPEN (CLIP) ×1 IMPLANT
BLADE SURG 15 STRL LF DISP TIS (BLADE) ×1 IMPLANT
CHLORAPREP W/TINT 26 (MISCELLANEOUS) IMPLANT
CLIP APPLIE 9.375 SM OPEN (CLIP) IMPLANT
CNTNR URN SCR LID CUP LEK RST (MISCELLANEOUS) IMPLANT
DERMABOND ADVANCED .7 DNX12 (GAUZE/BANDAGES/DRESSINGS) ×1 IMPLANT
DEVICE DUBIN SPECIMEN MAMMOGRA (MISCELLANEOUS) ×1 IMPLANT
DRAPE LAPAROTOMY TRNSV 106X77 (MISCELLANEOUS) ×1 IMPLANT
ELECT CAUTERY BLADE TIP 2.5 (TIP) ×1 IMPLANT
ELECT REM PT RETURN 9FT ADLT (ELECTROSURGICAL) ×1 IMPLANT
ELECTRODE CAUTERY BLDE TIP 2.5 (TIP) ×1 IMPLANT
ELECTRODE REM PT RTRN 9FT ADLT (ELECTROSURGICAL) ×1 IMPLANT
GLOVE ORTHO TXT STRL SZ7.5 (GLOVE) ×1 IMPLANT
GOWN STRL REUS W/ TWL LRG LVL3 (GOWN DISPOSABLE) ×1 IMPLANT
GOWN STRL REUS W/ TWL XL LVL3 (GOWN DISPOSABLE) ×1 IMPLANT
KIT MARKER MARGIN INK (KITS) IMPLANT
KIT TURNOVER KIT A (KITS) ×1 IMPLANT
MANIFOLD NEPTUNE II (INSTRUMENTS) ×1 IMPLANT
MARKER MARGIN CORRECT CLIP (MARKER) IMPLANT
NDL HYPO 22X1.5 SAFETY MO (MISCELLANEOUS) ×1 IMPLANT
NEEDLE HYPO 22X1.5 SAFETY MO (MISCELLANEOUS) ×1 IMPLANT
PACK BASIN MINOR ARMC (MISCELLANEOUS) ×1 IMPLANT
SHEATH BREAST BIOPSY SKIN MKR (SHEATH) ×1 IMPLANT
SUT MNCRL 4-0 27 PS-2 XMFL (SUTURE) ×1 IMPLANT
SUT SILK 2 0 SH (SUTURE) IMPLANT
SUT VIC AB 3-0 SH 27X BRD (SUTURE) ×1 IMPLANT
SUTURE MNCRL 4-0 27XMF (SUTURE) ×1 IMPLANT
SYR 20ML LL LF (SYRINGE) ×1 IMPLANT
TRAP FLUID SMOKE EVACUATOR (MISCELLANEOUS) ×1 IMPLANT
TRAP NEPTUNE SPECIMEN COLLECT (MISCELLANEOUS) ×1 IMPLANT
WATER STERILE IRR 1000ML POUR (IV SOLUTION) ×1 IMPLANT
WATER STERILE IRR 500ML POUR (IV SOLUTION) ×1 IMPLANT

## 2023-06-01 NOTE — Anesthesia Preprocedure Evaluation (Signed)
 Anesthesia Evaluation  Patient identified by MRN, date of birth, ID band Patient awake    Reviewed: Allergy & Precautions, NPO status , Patient's Chart, lab work & pertinent test results  History of Anesthesia Complications Negative for: history of anesthetic complications  Airway Mallampati: II  TM Distance: >3 FB Neck ROM: Full    Dental no notable dental hx. (+) Teeth Intact   Pulmonary sleep apnea and Continuous Positive Airway Pressure Ventilation , neg COPD, Patient abstained from smoking.Not current smoker, former smoker   Pulmonary exam normal breath sounds clear to auscultation       Cardiovascular Exercise Tolerance: Good METShypertension, Pt. 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: Dysplastic nevus     Comment:  epigastric area - moderate No date: Gastric polyp No date: GERD (gastroesophageal reflux disease) No date: HSV (herpes simplex virus) infection No date: Hyperlipidemia No date: Hypertension     Comment:  H/O LOST WEIGHT AND NO MEDS X 6 YRS  No date: Hypothyroidism No date: Obesity No date: OSA on CPAP No date: PMDD (premenstrual dysphoric disorder) No date: Pre-diabetes     Comment:  PRIOR TO GASTRIC SLEEVE No date: Thyroid disease No date: Vitamin D deficiency  Reproductive/Obstetrics                              Anesthesia Physical Anesthesia Plan  ASA: 2  Anesthesia Plan: General   Post-op Pain Management: Tylenol PO (pre-op)*, Celebrex PO (pre-op)* and Gabapentin PO (pre-op)*   Induction: Intravenous  PONV Risk Score and Plan: 3 and Ondansetron, Dexamethasone and Midazolam  Airway Management Planned: LMA  Additional Equipment: None  Intra-op Plan:   Post-operative Plan: Extubation in OR  Informed Consent: I have reviewed the patients History and Physical, chart, labs and discussed the procedure including the risks, benefits and alternatives for the proposed anesthesia with the patient or authorized representative who has indicated his/her understanding and acceptance.     Dental advisory given  Plan Discussed with: CRNA and Surgeon  Anesthesia Plan Comments: (Discussed risks of anesthesia with patient, including PONV, sore throat, lip/dental/eye damage. Rare risks discussed as well, such as cardiorespiratory and neurological sequelae, and allergic reactions. Discussed the role of CRNA in patient's perioperative care. Patient understands.)       Anesthesia Quick Evaluation

## 2023-06-01 NOTE — Anesthesia Postprocedure Evaluation (Signed)
 Anesthesia Post Note  Patient: Dominique Kerr  Procedure(s) Performed: BREAST LUMPECTOMY WITH RADIO FREQUENCY LOCALIZER (Right: Breast)  Patient location during evaluation: PACU Anesthesia Type: General Level of consciousness: awake and alert Pain management: pain level controlled Vital Signs Assessment: post-procedure vital signs reviewed and stable Respiratory status: spontaneous breathing, nonlabored ventilation, respiratory function stable and patient connected to nasal cannula oxygen Cardiovascular status: blood pressure returned to baseline and stable Postop Assessment: no apparent nausea or vomiting Anesthetic complications: no   No notable events documented.   Last Vitals:  Vitals:   06/01/23 0940 06/01/23 0948  BP: 138/73 (!) 150/84  Pulse: 73 86  Resp: 15 16  Temp: (!) 36.2 C 36.4 C  SpO2: 98% 98%    Last Pain:  Vitals:   06/01/23 0948  TempSrc: Temporal  PainSc: 2                  Cleda Mccreedy Ennis Delpozo

## 2023-06-01 NOTE — Anesthesia Procedure Notes (Signed)
 Procedure Name: LMA Insertion Date/Time: 06/01/2023 7:39 AM  Performed by: Morene Crocker, CRNAPre-anesthesia Checklist: Patient identified, Patient being monitored, Timeout performed, Emergency Drugs available and Suction available Patient Re-evaluated:Patient Re-evaluated prior to induction Oxygen Delivery Method: Circle system utilized Preoxygenation: Pre-oxygenation with 100% oxygen Induction Type: IV induction Ventilation: Mask ventilation without difficulty LMA: LMA inserted LMA Size: 4.0 Tube type: Oral Number of attempts: 1 Placement Confirmation: positive ETCO2 and breath sounds checked- equal and bilateral Tube secured with: Tape Dental Injury: Teeth and Oropharynx as per pre-operative assessment  Comments: Igel LMA used. LMA placed smoothly, atraumatic, no complications noted.

## 2023-06-01 NOTE — Transfer of Care (Signed)
 Immediate Anesthesia Transfer of Care Note  Patient: Dominique Kerr  Procedure(s) Performed: BREAST LUMPECTOMY WITH RADIO FREQUENCY LOCALIZER (Right: Breast)  Patient Location: PACU  Anesthesia Type:General  Level of Consciousness: awake, alert , and oriented  Airway & Oxygen Therapy: Patient Spontanous Breathing and Patient connected to face mask oxygen  Post-op Assessment: Report given to RN and Post -op Vital signs reviewed and stable  Post vital signs: stable  Last Vitals:  Vitals Value Taken Time  BP 143/80 06/01/23 0905  Temp 36.4 C 06/01/23 0905  Pulse 93 06/01/23 0907  Resp 22 06/01/23 0907  SpO2 100 % 06/01/23 0907  Vitals shown include unfiled device data.  Last Pain:  Vitals:   06/01/23 0616  TempSrc: Temporal  PainSc: 0-No pain         Complications: No notable events documented.

## 2023-06-01 NOTE — Interval H&P Note (Signed)
 History and Physical Interval Note:  06/01/2023 7:22 AM  Dominique Kerr  has presented today for surgery, with the diagnosis of Intraductal carcinoma in situ of right breast.  The various methods of treatment have been discussed with the patient and family. After consideration of risks, benefits and other options for treatment, the patient has consented to  Procedure(s): BREAST LUMPECTOMY WITH RADIO FREQUENCY LOCALIZER (Right) as a surgical intervention.  The patient's history has been reviewed, patient examined, no change in status, stable for surgery.  I have reviewed the patient's chart and labs.  Questions were answered to the patient's satisfaction.     Dominique Kerr

## 2023-06-01 NOTE — Op Note (Addendum)
 Right breast lumpectomy, [place] tag directed.  Pre-operative Diagnosis: DCIS right breast    Post-operative Diagnosis: Same  Surgeon: Campbell Lerner, M.D., FACS  Anesthesia: General LMA  Procedure: Right breast lumpectomy, [facility] tag directed.  Procedure Details  The patient was seen again in the Holding Room. The benefits, complications, treatment options, and expected outcomes were discussed with the patient. The risks of bleeding, infection, recurrence of symptoms, failure to resolve symptoms, hematoma, seroma, open wound, cosmetic deformity, and the need for further surgery were discussed.  The patient was taken to Operating Room, identified as Dominique Kerr Dominique Kerr and the procedure verified.  A Time Out was held and the above information confirmed.  Prior to the induction of general anesthesia, antibiotic prophylaxis was administered. VTE prophylaxis was in place. The patient was positioned in the supine position. Appropriate anesthesia was then administered and tolerated well.  The [facility] probe confirmed the site of shortest distance and highest cadence and those sites is marked for incision.   Attention was turned to the [facility] localization sites where an incision was made. Dissection using the Scout to perform a lumpectomy to include 2 separate Savi scout tags, en bloc, with adequate margins was performed. This was done with sharp dissection with scissors. Hemostasis is controlled where the risk of damaging the tag is diminished, and the cavity packed.  The specimen was taken to the back table and painted to demarcate the 6 surfaces of potential margin.  Orientation clips applied as well.  I returned to the cavity to remove the packing, and additional hemostasis was confirmed with electrocautery.   Marking clips to highlight the tissue surrounding the biopsy cavity placed.  Once assuring that hemostasis was adequate and checked multiple times being irrigated with water the  wound was closed with interrupted 3-0 Vicryl followed by 4-0 subcuticular Monocryl sutures.   Dermabond is utilized to seal the incision.  Local anesthesia of 0.25% Marcaine with epinephrine is infiltrated into the cavity/perimeter.   Findings: Faxitron imaging: Both Savi scout tags noted, centrally within the specimen.  Estimated Blood Loss: Minimal, 5 mL         Drains: None         Specimens: Right lateral breast tissue       Complications: None         Condition: Stable    Campbell Lerner, M.D., [facility]  06/01/2023 ; 9:07 AM

## 2023-06-02 ENCOUNTER — Encounter: Payer: Self-pay | Admitting: Surgery

## 2023-06-03 ENCOUNTER — Other Ambulatory Visit: Payer: Self-pay | Admitting: Pathology

## 2023-06-03 LAB — SURGICAL PATHOLOGY

## 2023-06-08 ENCOUNTER — Encounter

## 2023-06-08 ENCOUNTER — Telehealth: Payer: Self-pay | Admitting: Surgery

## 2023-06-08 ENCOUNTER — Encounter: Payer: Self-pay | Admitting: *Deleted

## 2023-06-08 NOTE — Telephone Encounter (Signed)
 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 Patient # is [id number]

## 2023-06-09 NOTE — Telephone Encounter (Signed)
 Called patient to make sure she seen her note in the My Chart

## 2023-06-15 ENCOUNTER — Inpatient Hospital Stay: Admitting: Occupational Therapy

## 2023-06-15 ENCOUNTER — Encounter: Payer: Self-pay | Admitting: Oncology

## 2023-06-15 ENCOUNTER — Inpatient Hospital Stay: Attending: Oncology | Admitting: Oncology

## 2023-06-15 ENCOUNTER — Ambulatory Visit
Admission: RE | Admit: 2023-06-15 | Discharge: 2023-06-15 | Disposition: A | Discharge status: Home / Self Care | Source: Ambulatory Visit | Attending: Radiation Oncology | Admitting: Radiation Oncology

## 2023-06-15 VITALS — BP 142/83 | HR 72 | Temp 98.6°F | Resp 19 | Ht 66.0 in | Wt 231.1 lb

## 2023-06-15 DIAGNOSIS — Z87891 Personal history of nicotine dependence: Secondary | ICD-10-CM | POA: Diagnosis not present

## 2023-06-15 DIAGNOSIS — D0511 Intraductal carcinoma in situ of right breast: Secondary | ICD-10-CM | POA: Insufficient documentation

## 2023-06-15 DIAGNOSIS — Z79811 Long term (current) use of aromatase inhibitors: Secondary | ICD-10-CM | POA: Diagnosis not present

## 2023-06-15 DIAGNOSIS — Z9889 Other specified postprocedural states: Secondary | ICD-10-CM

## 2023-06-15 DIAGNOSIS — Z170 Estrogen receptor positive status [ER+]: Secondary | ICD-10-CM | POA: Insufficient documentation

## 2023-06-15 DIAGNOSIS — Z7189 Other specified counseling: Secondary | ICD-10-CM | POA: Diagnosis not present

## 2023-06-15 MED ORDER — LETROZOLE 2.5 MG PO TABS: 2.5000 mg | ORAL_TABLET | Freq: Every day | ORAL | 3 refills | Status: DC

## 2023-06-15 NOTE — Progress Notes (Signed)
 Hematology/Oncology Consult note [facility]  [phone] [phone]  Patient Care Team: Cannady, Jolene T, NP as PCP - General (Nurse Practitioner) Waverly Hageman, RN as Oncology Nurse Navigator Glenis Langdon, MD as Consulting Physician (Radiation Oncology)   Name of the patient: Dominique Kerr  [id number]  25-Mar-1960   Date of visit: 06/15/23  Diagnosis-right breast DCIS ER positive  Chief complaint/ Reason for visit-discuss final pathology results and further management  Heme/Onc history: Patient is a 63 year old female diagnosed with 2.8 cm DCIS based on mammogram and biopsy in February 2025.  She underwentLumpectomy in April 2025 which showed 2.2 cm grade 2 DCIS with comedonecrosis.  Margins were close at 0.5 mm.  ER +90%.  Patient was using transdermal estrogen patch for hormone replacement therapy which she stopped after the diagnosis of DCIS  Interval history-she is recovering well from her lumpectomy and denies any specific complaints at this time  ECOG PS- 0 Pain scale- 0   Review of systems- Review of Systems  Constitutional:  Negative for chills, fever, malaise/fatigue and weight loss.  HENT:  Negative for congestion, ear discharge and nosebleeds.   Eyes:  Negative for blurred vision.  Respiratory:  Negative for cough, hemoptysis, sputum production, shortness of breath and wheezing.   Cardiovascular:  Negative for chest pain, palpitations, orthopnea and claudication.  Gastrointestinal:  Negative for abdominal pain, blood in stool, constipation, diarrhea, heartburn, melena, nausea and vomiting.  Genitourinary:  Negative for dysuria, flank pain, frequency, hematuria and urgency.  Musculoskeletal:  Negative for back pain, joint pain and myalgias.  Skin:  Negative for rash.  Neurological:  Negative for dizziness, tingling, focal weakness, seizures, weakness and headaches.  Endo/Heme/Allergies:  Does not bruise/bleed easily.   Psychiatric/Behavioral:  Negative for depression and suicidal ideas. The patient does not have insomnia.       Allergies  Allergen Reactions   Lisinopril Cough     Past Medical History:  Diagnosis Date   Allergy    Anemia    YEARS AGO WHILE HAVING PERIODS   Anxiety    Arthritis    DCIS (ductal carcinoma in situ) of breast    Dysplastic nevus 06/15/2006   epigastric area - moderate   Gastric polyp    GERD (gastroesophageal reflux disease)    HSV (herpes simplex virus) infection    Hyperlipidemia    Hypertension    H/O LOST WEIGHT AND NO MEDS X 6 YRS    Hypothyroidism    Obesity    OSA on CPAP    PMDD (premenstrual dysphoric disorder)    Pre-diabetes    PRIOR TO GASTRIC SLEEVE   Thyroid  disease    Vitamin D  deficiency      Past Surgical History:  Procedure Laterality Date   BLADDER SURGERY Right 05/09/2023   rt breast stereo calcs asy x clip path pending   BREAST BIOPSY     BREAST BIOPSY Right 05/09/2023   MM RT BREAST BX W LOC DEV 1ST LESION IMAGE BX SPEC STEREO GUIDE 05/09/2023 [facility]   BREAST BIOPSY Right 05/25/2023   MM RT RADIO FREQUENCY TAG LOC MAMMO GUIDE 05/25/2023 [facility]   BREAST BIOPSY Right 05/25/2023   MM RT RADIO FREQUENCY TAG EA ADD LESION LOC MAMMO GUIDE 05/25/2023 [facility]   BREAST LUMPECTOMY WITH RADIO FREQUENCY LOCALIZER Right 06/01/2023   Procedure: BREAST LUMPECTOMY WITH RADIO FREQUENCY LOCALIZER;  Surgeon: Flynn Hylan, MD;  Location: [facility];  Service: General;  Laterality: Right;   CHOLECYSTECTOMY  KNEE ARTHROSCOPY Left    KNEE ARTHROSCOPY Left 04/09/2020   Procedure: ARTHROSCOPY KNEE;  Surgeon: Arlyne Lame, MD;  Location: [facility];  Service: Orthopedics;  Laterality: Left;   LAPAROSCOPIC GASTRIC SLEEVE RESECTION     parotid gland removal      Social History   Socioeconomic History   Marital status: Single    Spouse name: Not on file   Number of children: Not on file   Years of education: Not on file    Highest education level: 12th grade  Occupational History   Occupation: [employer]  Tobacco Use   Smoking status: Former    Current packs/day: 0.00    Average packs/day: 0.3 packs/day for 3.0 years (0.8 ttl pk-yrs)    Types: Cigarettes    Start date: 03/31/1997    Quit date: 03/31/2000    Years since quitting: 23.2   Smokeless tobacco: Former  Building services engineer status: Never Used  Substance and Sexual Activity   Alcohol use: Yes    Alcohol/week: 0.0 standard drinks of alcohol    Comment: RARELY   Drug use: No   Sexual activity: Not Currently  Other Topics Concern   Not on file  Social History Narrative   Not on file   Social Drivers of Health   Financial Resource Strain: Low Risk  (03/30/2023)   Received from [facility]   Overall Financial Resource Strain (CARDIA)    Difficulty of Paying Living Expenses: Not hard at all  Food Insecurity: No Food Insecurity (05/18/2023)   Hunger Vital Sign    Worried About Running Out of Food in the Last Year: Never true    Ran Out of Food in the Last Year: Never true  Transportation Needs: No Transportation Needs (05/18/2023)   PRAPARE - Administrator, Civil Service (Medical): No    Lack of Transportation (Non-Medical): No  Physical Activity: Insufficiently Active (02/20/2023)   Exercise Vital Sign    Days of Exercise per Week: 5 days    Minutes of Exercise per Session: 10 min  Stress: No Stress Concern Present (02/20/2023)   [employer] of Occupational Health - Occupational Stress Questionnaire    Feeling of Stress : Only a little  Social Connections: Moderately Isolated (02/20/2023)   Social Connection and Isolation Panel [NHANES]    Frequency of Communication with Friends and Family: More than three times a week    Frequency of Social Gatherings with Friends and Family: More than three times a week    Attends Religious Services: 1 to 4 times per year    Active Member of [employer] or  Organizations: No    Attends Engineer, structural: Not on file    Marital Status: Never married  Intimate Partner Violence: Not At Risk (05/18/2023)   Humiliation, Afraid, Rape, and Kick questionnaire    Fear of Current or Ex-Partner: No    Emotionally Abused: No    Physically Abused: No    Sexually Abused: No    Family History  Problem Relation Age of Onset   Diabetes Mother    Hepatitis C Mother    Sleep apnea Father    Arthritis Sister    Bone cancer Paternal Uncle    Throat cancer Paternal Uncle    Diabetes Maternal Grandmother    Cancer Maternal Grandfather    Breast cancer Neg Hx      Current Outpatient Medications:    letrozole  (FEMARA ) 2.5 MG  tablet, Take 1 tablet (2.5 mg total) by mouth daily., Disp: 30 tablet, Rfl: 3   aspirin EC 81 MG tablet, Take 81 mg by mouth daily. , Disp: , Rfl:    atorvastatin  (LIPITOR) 40 MG tablet, Take 1 tablet (40 mg total) by mouth daily. (Patient taking differently: Take 40 mg by mouth at bedtime.), Disp: 90 tablet, Rfl: 4   Azelastine HCl 137 MCG/SPRAY SOLN, Place 2 sprays into both nostrils daily., Disp: , Rfl:    celecoxib  (CELEBREX ) 200 MG capsule, Take 1 capsule (200 mg total) by mouth daily as needed for moderate pain., Disp: 30 capsule, Rfl: 12   Cholecalciferol (VITAMIN D ) 50 MCG (2000 UT) tablet, Take 2,000 Units by mouth daily., Disp: , Rfl:    diphenhydramine-acetaminophen  (TYLENOL  PM) 25-500 MG TABS tablet, Take 2 tablets by mouth at bedtime as needed (sleep)., Disp: , Rfl:    fluticasone (FLONASE) 50 MCG/ACT nasal spray, Place 2 sprays into both nostrils daily as needed for allergies or rhinitis., Disp: , Rfl:    HYDROcodone -acetaminophen  (NORCO/VICODIN) 5-325 MG tablet, Take 1 tablet by mouth every 6 (six) hours as needed for moderate pain (pain score 4-6)., Disp: 15 tablet, Rfl: 0   levothyroxine  (SYNTHROID ) 125 MCG tablet, TAKE 1 TABLET BY MOUTH EVERY DAY (Patient taking differently: Take 125 mcg by mouth daily before  breakfast.), Disp: 90 tablet, Rfl: 2   Multiple Vitamin (MULTIVITAMIN WITH MINERALS) TABS tablet, Take 1 tablet by mouth daily., Disp: , Rfl:    NON FORMULARY, Pt uses a cpap nightly, Disp: , Rfl:    omeprazole  (PRILOSEC) 20 MG capsule, TAKE 1 CAPSULE BY MOUTH EVERY DAY (Patient taking differently: 20 mg every morning. TAKE 1 CAPSULE BY MOUTH EVERY DAY), Disp: 90 capsule, Rfl: 4   sertraline  (ZOLOFT ) 100 MG tablet, Take 1 tablet (100 mg total) by mouth daily. (Patient taking differently: Take 100 mg by mouth every morning.), Disp: 90 tablet, Rfl: 4  Physical exam:  Vitals:   06/15/23 0936  BP: (!) 142/83  Pulse: 72  Resp: 19  Temp: 98.6 F (37 C)  TempSrc: Tympanic  SpO2: 96%  Weight: 231 lb 1.6 oz (104.8 kg)  Height: 5\' 6"  (1.676 m)   Physical Exam Cardiovascular:     Rate and Rhythm: Normal rate and regular rhythm.     Heart sounds: Normal heart sounds.  Pulmonary:     Effort: Pulmonary effort is normal.  Skin:    General: Skin is warm and dry.  Neurological:     Mental Status: She is alert and oriented to person, place, and time.      I have personally reviewed labs listed below:    Latest Ref Rng & Units 02/24/2023    8:53 AM  CMP  Glucose 70 - 99 mg/dL 161   BUN 8 - 27 mg/dL 12   Creatinine 0.96 - 1.00 mg/dL 0.45   Sodium 409 - 811 mmol/L 142   Potassium 3.5 - 5.2 mmol/L 4.2   Chloride 96 - 106 mmol/L 103   CO2 20 - 29 mmol/L 24   Calcium  8.7 - 10.3 mg/dL 9.5   Total Protein 6.0 - 8.5 g/dL 7.1   Total Bilirubin 0.0 - 1.2 mg/dL 0.3   Alkaline Phos 44 - 121 IU/L 103   AST 0 - 40 IU/L 34   ALT 0 - 32 IU/L 43       Latest Ref Rng & Units 02/24/2023    8:53 AM  CBC  WBC 3.4 -  10.8 x10E3/uL 5.3   Hemoglobin 11.1 - 15.9 g/dL 16.1   Hematocrit 09.6 - 46.6 % 37.1   Platelets 150 - 450 x10E3/uL 295    I have personally reviewed Radiology images listed below: No images are attached to the encounter.  MM Breast Surgical Specimen Result Date: 06/01/2023 CLINICAL  DATA:  Status post [facility] bracket localized RIGHT breast lumpectomy for DCIS. EXAM: SPECIMEN RADIOGRAPH OF THE RIGHT BREAST COMPARISON:  Previous exam(s). FINDINGS: Status post excision of the RIGHT breast. TWO Savi Scout reflectors and the X shaped clip are present within the specimen. IMPRESSION: Specimen radiograph of the RIGHT breast. Electronically Signed   By: Sande Cromer M.D.   On: 06/01/2023 09:23   MM RT RADIO FREQUENCY TAG LOC MAMMO GUIDE Result Date: 05/25/2023 CLINICAL DATA:  61-year-old female with recently diagnosed DCIS at site of X shaped biopsy marking clip in the upper-outer right breast presents for bracketed localization prior to planned lumpectomy. EXAM: NEEDLE LOCALIZATION OF THE RIGHT BREAST WITH MAMMO GUIDANCE COMPARISON:  Previous exam(s). FINDINGS: Patient presents for needle localization prior to right lumpectomy. I met with the patient and we discussed the procedure of needle localization including benefits and alternatives. We discussed the high likelihood of a successful procedure. We discussed the risks of the procedure, including infection, bleeding, tissue injury, and further surgery. Informed, written consent was given. The usual time-out protocol was performed immediately prior to the procedure. SITE 1: RIGHT BREAST CALCIFICATIONS/X SHAPED BIOPSY MARKING CLIP UPPER OUTER POSTERIOR: Using mammographic guidance, sterile technique, 1% lidocaine  and a 10 cm SAVI SCOUT needle, the calcifications with associated exudate biopsy marking clip was localized using a lateral to medial approach. Reflector function was confirmed with an auditory signal from the SAVI SCOUT guide. The images were marked for Dr. Ofilia Benton. SITE 2: RIGHT BREAST CALCIFICATIONS UPPER OUTER ANTERIOR: Using mammographic guidance, sterile technique, 1% lidocaine  and a 10 cm SAVI SCOUT needle, the calcifications in the upper outer anterior right breast was localized using a lateral to medial approach. Reflector  function was confirmed with an auditory signal from the SAVI SCOUT guide. The images were marked for Dr. Ofilia Benton. IMPRESSION: Journalist, newspaper localization of the right breast. No apparent complications. Electronically Signed   By: Alger Infield M.D.   On: 05/25/2023 08:25   MM RT RADIO FREQUENCY TAG EA ADD LESION LOC MAMMO GUIDE Result Date: 05/25/2023 CLINICAL DATA:  65-year-old female with recently diagnosed DCIS at site of X shaped biopsy marking clip in the upper-outer right breast presents for bracketed localization prior to planned lumpectomy. EXAM: NEEDLE LOCALIZATION OF THE RIGHT BREAST WITH MAMMO GUIDANCE COMPARISON:  Previous exam(s). FINDINGS: Patient presents for needle localization prior to right lumpectomy. I met with the patient and we discussed the procedure of needle localization including benefits and alternatives. We discussed the high likelihood of a successful procedure. We discussed the risks of the procedure, including infection, bleeding, tissue injury, and further surgery. Informed, written consent was given. The usual time-out protocol was performed immediately prior to the procedure. SITE 1: RIGHT BREAST CALCIFICATIONS/X SHAPED BIOPSY MARKING CLIP UPPER OUTER POSTERIOR: Using mammographic guidance, sterile technique, 1% lidocaine  and a 10 cm SAVI SCOUT needle, the calcifications with associated exudate biopsy marking clip was localized using a lateral to medial approach. Reflector function was confirmed with an auditory signal from the SAVI SCOUT guide. The images were marked for Dr. Ofilia Benton. SITE 2: RIGHT BREAST CALCIFICATIONS UPPER OUTER ANTERIOR: Using mammographic guidance, sterile technique, 1% lidocaine  and a  10 cm SAVI SCOUT needle, the calcifications in the upper outer anterior right breast was localized using a lateral to medial approach. Reflector function was confirmed with an auditory signal from the SAVI SCOUT guide. The images were marked for Dr. Ofilia Benton.  IMPRESSION: Journalist, newspaper localization of the right breast. No apparent complications. Electronically Signed   By: Alger Infield M.D.   On: 05/25/2023 08:25     Assessment and plan- Patient is a 63 y.o. female with history of right breast DCIS ER positive s/p lumpectomy here to discuss further management  Discussed the results of final pathology with the patient which showed 2.2 cm DCIS with close margins of 0.5 mm.  Sentinel lymph node biopsy was not done.  Tumor is ER +90%.  I would recommend proceeding with adjuvant radiation therapy at this time.  As per [facility] DCIS algorithm, her 5 and 10-year risk of recurrence of DCIS without any adjuvant therapies 16 and 24% respectively.  Adjuvant radiation therapy will bring down this risk to 6 and 10% respectively and adjuvant endocrine therapy further brings this risk down to 3 and 5% respectively.    Given that her tumor was ER PR positive hormone therapy is indicated at this time.  Idiscussed the role for hormone therapy. Given that she is postmenopausal I would favor 5 years of adjuvant hormone therapy with aromatase inhibitor. I discussed the risks and benefits of letrozole   including all but not limited to fatigue, hypercholesterolemia, hot flashes, arthralgias and worsening bone health.  Patient will also need to be on calcium  1200 mg along with vitamin D  800 international units.  We will obtain a baseline bone density scan. I would like her to finish radiation therapy and start hormone therapy thereafter. Patient verbalized understanding and agrees to proceed.  Treatment will be given with a curative intent   Cancer Staging  Ductal carcinoma in situ (DCIS) of right breast Staging form: Breast, AJCC 8th Edition - Pathologic stage from 06/15/2023: Stage Unknown (pTis (DCIS), pNX, cM0, G2, ER+, PR: Not Assessed, HER2: Not Assessed) - Signed by Avonne Boettcher, MD on 06/15/2023 Stage prefix: Initial diagnosis Nuclear grade: G2 Multigene  prognostic tests performed: None Histologic grading system: 3 grade system       Visit Diagnosis 1. Ductal carcinoma in situ (DCIS) of right breast      Dr. Seretha Dance, MD, MPH [facility] [phone] 06/15/2023 12:24 PM

## 2023-06-15 NOTE — Therapy (Signed)
 [facility] - A Dept Of [place] [address], [address] [place], [place], [address] Phone: [phone]   Fax:  [phone]  Occupational Therapy Screen  Patient Details  Name: Farryn Linares MRN: [id number] Date of Birth: 1960-09-12 No data recorded  Encounter Date: 06/15/2023   OT End of Session - 06/15/23 0924     Visit Number 0             Past Medical History:  Diagnosis Date   Allergy    Anemia    YEARS AGO WHILE HAVING PERIODS   Anxiety    Arthritis    DCIS (ductal carcinoma in situ) of breast    Dysplastic nevus 06/15/2006   epigastric area - moderate   Gastric polyp    GERD (gastroesophageal reflux disease)    HSV (herpes simplex virus) infection    Hyperlipidemia    Hypertension    H/O LOST WEIGHT AND NO MEDS X 6 YRS    Hypothyroidism    Obesity    OSA on CPAP    PMDD (premenstrual dysphoric disorder)    Pre-diabetes    PRIOR TO GASTRIC SLEEVE   Thyroid  disease    Vitamin D  deficiency     Past Surgical History:  Procedure Laterality Date   BLADDER SURGERY Right 05/09/2023   rt breast stereo calcs asy x clip path pending   BREAST BIOPSY     BREAST BIOPSY Right 05/09/2023   MM RT BREAST BX W LOC DEV 1ST LESION IMAGE BX SPEC STEREO GUIDE 05/09/2023 [facility]   BREAST BIOPSY Right 05/25/2023   MM RT RADIO FREQUENCY TAG LOC MAMMO GUIDE 05/25/2023 [facility]   BREAST BIOPSY Right 05/25/2023   MM RT RADIO FREQUENCY TAG EA ADD LESION LOC MAMMO GUIDE 05/25/2023 [facility]   BREAST LUMPECTOMY WITH RADIO FREQUENCY LOCALIZER Right 06/01/2023   Procedure: BREAST LUMPECTOMY WITH RADIO FREQUENCY LOCALIZER;  Surgeon: Flynn Hylan, MD;  Location: [facility];  Service: General;  Laterality: Right;   CHOLECYSTECTOMY     KNEE ARTHROSCOPY Left    KNEE ARTHROSCOPY Left 04/09/2020   Procedure: ARTHROSCOPY KNEE;  Surgeon: Arlyne Lame, MD;  Location: [facility];  Service: Orthopedics;  Laterality:  Left;   LAPAROSCOPIC GASTRIC SLEEVE RESECTION     parotid gland removal       Patient seen by OT for screening.  Patient had right lumpectomy 06/01/2023 by Dr. Ofilia Benton.  Patient returned to work on 06/07/2023.  She works at the [employer]ologist, sport and exercise at [employer].  Patient reports no issues. No lymph nodes was removed. Bilateral shoulder active range of motion within normal limits.  Pain-free. Assess radiation position.  Patient able to get comfortably in it. Patient was educated in home program to do during radiation in the mornings for active assisted range of motion shoulder flexion and abduction in the wall 10 reps.  Pain-free As well as scapular squeezes 10 reps 3 times a day. Patient show understanding and demonstrate home exercises. Circumference in bilateral upper extremities Right elbow 28.5 cm and left 29 cm 15 cm proximal to elbow right 34 left 34 cm Lymphedema education was not done because patient had no lymph nodes removed.                                          Visit Diagnosis: S/P lumpectomy, right breast  Problem List Patient Active Problem List   Diagnosis Date Noted   Ductal carcinoma in situ (DCIS) of right breast 05/17/2023   Pap smear abnormality of cervix/human papillomavirus (HPV) positive 02/14/2021   Gastric polyp 03/22/2020   GERD (gastroesophageal reflux disease) 07/13/2017   Obstructive sleep apnea of adult 07/13/2017   IFG (impaired fasting glucose) 11/25/2014   Postmenopausal HRT (hormone replacement therapy) 11/25/2014   HSV (herpes simplex virus) infection 11/05/2014   Allergic rhinitis 11/05/2014   Anxiety and depression 11/05/2014   Vitamin D  deficiency 11/05/2014   HLD (hyperlipidemia) 07/23/2013   Adult hypothyroidism 07/23/2013    Heloise Lobo, OTR/L,CLT 06/15/2023, 9:25 AM  [facility] - A Dept Of [place] [address] [place], [place],  [address] Phone: [phone]   Fax:  [phone]  Name: Valery Amedee MRN: [id number] Date of Birth: 1961-01-12

## 2023-06-15 NOTE — Progress Notes (Unsigned)
 [facility] POST-OP OFFICE VISIT  06/16/2023  HPI: Dominique Kerr is a 63 y.o. female had surgery on June 01, 2023, now s/p Scout tag bracketed right breast lumpectomy for DCIS.  Path as noted below.  She has no complaints or issues, has seen both oncology and radiation oncology yesterday in preparation for her ongoing treatment.   Pathology: FINAL DIAGNOSIS       1. Breast, lumpectomy, right lateral tissue :      - DUCTAL CARCINOMA IN SITU (DCIS).      - SEE CANCER SUMMARY BELOW.      - BIOPSY SITE CHANGE WITH ASSOCIATED CLIP.      - TWO BRACKETING SCOUT TAGS PRESENT.  DATE SIGNED OUT: 06/03/2023 ELECTRONIC SIGNATURE : Brunetta Capes Md, Alexandria Ida , Pathologist, Electronic Signature  MICROSCOPIC DESCRIPTION 1. CANCER CASE SUMMARY: DUCTAL CARCINOMA IN SITU OF THE BREAST Standard(s): AJCC-UICC 8  SPECIMEN Procedure: Lumpectomy Specimen Laterality: Right  TUMOR Histologic Type: Ductal carcinoma in situ Size (Extent) of DCIS:  Estimated size (extent) of DCIS is at least 22 mm Nuclear Grade: Grade 2 (intermediate) Necrosis: Present, central (expansive "comedo" necrosis)  MARGINS Margin Status: All margins negative for DCIS Distance from DCIS to closest margin: 0.5 mm Specify closest margin: Anterior  REGIONAL LYMPH NODES Regional Lymph node Status: Not applicable (no regional lymph nodes submitted or found)  DISTANT METASTASIS Distant Site(s) Involved, if applicable (select all that apply): Not applicable    Vital signs: BP 135/82   Pulse 76   Temp 98.5 F (36.9 C) (Oral)   Ht 5\' 6"  (1.676 m)   Wt 228 lb (103.4 kg)   LMP  (LMP Unknown)   SpO2 96%   BMI 36.80 kg/m    Physical Exam: Constitutional: She appears well, comfortable.  Skin: Right lateral breast without ecchymosis, induration or edema.  Incision appears to be intact, much of the Dermabond has flaked off.  No erythema, no ecchymosis, no marked retraction.    Assessment/Plan: This is a 63  y.o. female status post Scout tagged/bracketed lumpectomy right breast for DCIS.  Currently no postoperative issues or complaints.  Patient Active Problem List   Diagnosis Date Noted   Ductal carcinoma in situ (DCIS) of right breast 05/17/2023   Pap smear abnormality of cervix/human papillomavirus (HPV) positive 02/14/2021   Gastric polyp 03/22/2020   GERD (gastroesophageal reflux disease) 07/13/2017   Obstructive sleep apnea of adult 07/13/2017   IFG (impaired fasting glucose) 11/25/2014   Postmenopausal HRT (hormone replacement therapy) 11/25/2014   HSV (herpes simplex virus) infection 11/05/2014   Allergic rhinitis 11/05/2014   Anxiety and depression 11/05/2014   Vitamin D  deficiency 11/05/2014   HLD (hyperlipidemia) 07/23/2013   Adult hypothyroidism 07/23/2013    - We discussed the role of right breast diagnostic mammography in 6 months, to establish new baseline.  I will be glad to see her back at that time for clinical examination.  I will turn over most of her immediate care to our associates at oncology and radiation oncology.    Flynn Hylan M.D., FACS 06/16/2023, 9:21 AM

## 2023-06-15 NOTE — Consult Note (Signed)
 NEW PATIENT EVALUATION  Name: Dominique Kerr  MRN: [id number]  Date:   06/15/2023     DOB: 1960-09-15   This 63 y.o. 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  REFERRING PHYSICIAN: Lemar Pyles, NP  CHIEF COMPLAINT: No chief complaint on file.   DIAGNOSIS: The encounter diagnosis was Ductal carcinoma in situ (DCIS) of right breast.   PREVIOUS INVESTIGATIONS:  Mammogram ultrasound reviewed Clinical notes reviewed Pathology reports reviewed  HPI: Patient is a 63 year old female who presented with abnormal mammogram of her right breast.  There was asymmetry measuring 2.8 cm associated with suspicious calcifications.  Recommendation for stereotactic biopsy was made and was positive for ductal carcinoma in situ ER positive.  Right axillary ultrasound showed no suspicious adenopathy.  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  All margins were clear at 0.5 mm.  No regional lymph nodes were submitted.  She has done well postoperatively.  She specifically denies breast tenderness cough bone pain.  She has been seen by medical oncology with recommendation for endocrine therapy after completion of radiation.  PLANNED TREATMENT REGIMEN: Hypofractionated right whole breast radiation  PAST MEDICAL HISTORY:  has a past medical history of Allergy, Anemia, Anxiety, Arthritis, DCIS (ductal carcinoma in situ) of breast, Dysplastic nevus (06/15/2006), Gastric polyp, GERD (gastroesophageal reflux disease), HSV (herpes simplex virus) infection, Hyperlipidemia, Hypertension, Hypothyroidism, Obesity, OSA on CPAP, PMDD (premenstrual dysphoric disorder), Pre-diabetes, Thyroid  disease, and Vitamin D  deficiency.    PAST SURGICAL HISTORY:  Past Surgical History:  Procedure Laterality Date    BLADDER SURGERY Right 05/09/2023   rt breast stereo calcs asy x clip path pending   BREAST BIOPSY     BREAST BIOPSY Right 05/09/2023   MM RT BREAST BX W LOC DEV 1ST LESION IMAGE BX SPEC STEREO GUIDE 05/09/2023 [facility]   BREAST BIOPSY Right 05/25/2023   MM RT RADIO FREQUENCY TAG LOC MAMMO GUIDE 05/25/2023 [facility]   BREAST BIOPSY Right 05/25/2023   MM RT RADIO FREQUENCY TAG EA ADD LESION LOC MAMMO GUIDE 05/25/2023 [facility]   BREAST LUMPECTOMY WITH RADIO FREQUENCY LOCALIZER Right 06/01/2023   Procedure: BREAST LUMPECTOMY WITH RADIO FREQUENCY LOCALIZER;  Surgeon: Flynn Hylan, MD;  Location: [facility];  Service: General;  Laterality: Right;   CHOLECYSTECTOMY     KNEE ARTHROSCOPY Left    KNEE ARTHROSCOPY Left 04/09/2020   Procedure: ARTHROSCOPY KNEE;  Surgeon: Arlyne Lame, MD;  Location: [facility];  Service: Orthopedics;  Laterality: Left;   LAPAROSCOPIC GASTRIC SLEEVE RESECTION     parotid gland removal      FAMILY HISTORY: family history includes Arthritis in her sister; Bone cancer in her paternal uncle; Cancer in her maternal grandfather; Diabetes in her maternal grandmother and mother; Hepatitis C in her mother; Sleep apnea in her father; Throat cancer in her paternal uncle.  SOCIAL HISTORY:  reports that she quit smoking about 23 years ago. Her smoking use included cigarettes. She started smoking about 26 years ago. She has a 0.8 pack-year smoking history. She has quit using smokeless tobacco. She reports current alcohol use. She reports that she does not use drugs.  ALLERGIES: Lisinopril  MEDICATIONS:  Current Outpatient Medications  Medication Sig Dispense Refill   aspirin EC 81 MG tablet Take 81 mg by mouth daily.  atorvastatin  (LIPITOR) 40 MG tablet Take 1 tablet (40 mg total) by mouth daily. (Patient taking differently: Take 40 mg by mouth at bedtime.) 90 tablet 4   Azelastine HCl 137 MCG/SPRAY SOLN Place 2 sprays into both nostrils daily.     celecoxib   (CELEBREX ) 200 MG capsule Take 1 capsule (200 mg total) by mouth daily as needed for moderate pain. 30 capsule 12   Cholecalciferol (VITAMIN D ) 50 MCG (2000 UT) tablet Take 2,000 Units by mouth daily.     diphenhydramine-acetaminophen  (TYLENOL  PM) 25-500 MG TABS tablet Take 2 tablets by mouth at bedtime as needed (sleep).     fluticasone (FLONASE) 50 MCG/ACT nasal spray Place 2 sprays into both nostrils daily as needed for allergies or rhinitis.     HYDROcodone -acetaminophen  (NORCO/VICODIN) 5-325 MG tablet Take 1 tablet by mouth every 6 (six) hours as needed for moderate pain (pain score 4-6). 15 tablet 0   letrozole  (FEMARA ) 2.5 MG tablet Take 1 tablet (2.5 mg total) by mouth daily. 30 tablet 3   levothyroxine  (SYNTHROID ) 125 MCG tablet TAKE 1 TABLET BY MOUTH EVERY DAY (Patient taking differently: Take 125 mcg by mouth daily before breakfast.) 90 tablet 2   Multiple Vitamin (MULTIVITAMIN WITH MINERALS) TABS tablet Take 1 tablet by mouth daily.     NON FORMULARY Pt uses a cpap nightly     omeprazole  (PRILOSEC) 20 MG capsule TAKE 1 CAPSULE BY MOUTH EVERY DAY (Patient taking differently: 20 mg every morning. TAKE 1 CAPSULE BY MOUTH EVERY DAY) 90 capsule 4   sertraline  (ZOLOFT ) 100 MG tablet Take 1 tablet (100 mg total) by mouth daily. (Patient taking differently: Take 100 mg by mouth every morning.) 90 tablet 4   No current facility-administered medications for this encounter.    ECOG PERFORMANCE STATUS:  0 - Asymptomatic  REVIEW OF SYSTEMS: Patient denies any weight loss, fatigue, weakness, fever, chills or night sweats. Patient denies any loss of vision, blurred vision. Patient denies any ringing  of the ears or hearing loss. No irregular heartbeat. Patient denies heart murmur or history of fainting. Patient denies any chest pain or pain radiating to her upper extremities. Patient denies any shortness of breath, difficulty breathing at night, cough or hemoptysis. Patient denies any swelling in the  lower legs. Patient denies any nausea vomiting, vomiting of blood, or coffee ground material in the vomitus. Patient denies any stomach pain. Patient states has had normal bowel movements no significant constipation or diarrhea. Patient denies any dysuria, hematuria or significant nocturia. Patient denies any problems walking, swelling in the joints or loss of balance. Patient denies any skin changes, loss of hair or loss of weight. Patient denies any excessive worrying or anxiety or significant depression. Patient denies any problems with insomnia. Patient denies excessive thirst, polyuria, polydipsia. Patient denies any swollen glands, patient denies easy bruising or easy bleeding. Patient denies any recent infections, allergies or URI. Patient "s visual fields have not changed significantly in recent time.   PHYSICAL EXAM: LMP  (LMP Unknown)  She status post wide local excision of the right breast incision is well-healed.  No dominant masses noted in either breast no axillary or supraclavicular adenopathy is appreciated.  Well-developed well-nourished patient in NAD. HEENT reveals PERLA, EOMI, discs not visualized.  Oral cavity is clear. No oral mucosal lesions are identified. Neck is clear without evidence of cervical or supraclavicular adenopathy. Lungs are clear to A&P. Cardiac examination is essentially unremarkable with regular rate and rhythm without murmur rub or thrill.  Abdomen is benign with no organomegaly or masses noted. Motor sensory and DTR levels are equal and symmetric in the upper and lower extremities. Cranial nerves II through XII are grossly intact. Proprioception is intact. No peripheral adenopathy or edema is identified. No motor or sensory levels are noted. Crude visual fields are within normal range.  LABORATORY DATA: Pathology reports reviewed    RADIOLOGY RESULTS: Mammogram and ultrasound reviewed compatible with above-stated findings   IMPRESSION: ER positive ductal  carcinoma in situ of the right breast status post wide local excision in 62 year old female  PLAN: At this time I have recommended a hypofractionated course of whole breast radiation over 3 weeks.  Would also boost her scar another 1000 cGy in photon beam therapy.  Risks and benefits of treatment including skin reaction fatigue alteration blood counts possible inclusion of superficial lung all were reviewed in detail with the patient.  I have personally set up and ordered CT simulation for first thing next week.  Patient also benefit from endocrine therapy after completion of radiation.  Patient comprehends my recommendations well.  I would like to take this opportunity to thank you for allowing me to participate in the care of your patient.Glenis Langdon, MD

## 2023-06-16 ENCOUNTER — Ambulatory Visit (INDEPENDENT_AMBULATORY_CARE_PROVIDER_SITE_OTHER): Admitting: Surgery

## 2023-06-16 ENCOUNTER — Encounter: Payer: Self-pay | Admitting: Surgery

## 2023-06-16 VITALS — BP 135/82 | HR 76 | Temp 98.5°F | Ht 66.0 in | Wt 228.0 lb

## 2023-06-16 DIAGNOSIS — D0511 Intraductal carcinoma in situ of right breast: Secondary | ICD-10-CM

## 2023-06-16 DIAGNOSIS — Z09 Encounter for follow-up examination after completed treatment for conditions other than malignant neoplasm: Secondary | ICD-10-CM

## 2023-06-16 NOTE — Patient Instructions (Signed)

## 2023-06-20 ENCOUNTER — Encounter: Payer: Self-pay | Admitting: *Deleted

## 2023-06-20 ENCOUNTER — Ambulatory Visit
Admission: RE | Admit: 2023-06-20 | Discharge: 2023-06-20 | Disposition: A | Discharge status: Home / Self Care | Source: Ambulatory Visit | Attending: Radiation Oncology | Admitting: Radiation Oncology

## 2023-06-20 ENCOUNTER — Other Ambulatory Visit

## 2023-06-20 DIAGNOSIS — D0511 Intraductal carcinoma in situ of right breast: Secondary | ICD-10-CM | POA: Insufficient documentation

## 2023-06-20 DIAGNOSIS — Z8261 Family history of arthritis: Secondary | ICD-10-CM | POA: Diagnosis not present

## 2023-06-20 DIAGNOSIS — E785 Hyperlipidemia, unspecified: Secondary | ICD-10-CM | POA: Diagnosis not present

## 2023-06-20 DIAGNOSIS — Z833 Family history of diabetes mellitus: Secondary | ICD-10-CM | POA: Insufficient documentation

## 2023-06-20 DIAGNOSIS — Z79811 Long term (current) use of aromatase inhibitors: Secondary | ICD-10-CM | POA: Insufficient documentation

## 2023-06-20 DIAGNOSIS — F419 Anxiety disorder, unspecified: Secondary | ICD-10-CM | POA: Insufficient documentation

## 2023-06-20 DIAGNOSIS — Z87891 Personal history of nicotine dependence: Secondary | ICD-10-CM | POA: Insufficient documentation

## 2023-06-20 DIAGNOSIS — I10 Essential (primary) hypertension: Secondary | ICD-10-CM | POA: Diagnosis not present

## 2023-06-20 DIAGNOSIS — Z79899 Other long term (current) drug therapy: Secondary | ICD-10-CM | POA: Insufficient documentation

## 2023-06-20 DIAGNOSIS — E669 Obesity, unspecified: Secondary | ICD-10-CM | POA: Insufficient documentation

## 2023-06-20 DIAGNOSIS — Z9049 Acquired absence of other specified parts of digestive tract: Secondary | ICD-10-CM | POA: Insufficient documentation

## 2023-06-20 DIAGNOSIS — Z86018 Personal history of other benign neoplasm: Secondary | ICD-10-CM | POA: Diagnosis not present

## 2023-06-20 DIAGNOSIS — E039 Hypothyroidism, unspecified: Secondary | ICD-10-CM | POA: Diagnosis not present

## 2023-06-20 DIAGNOSIS — Z888 Allergy status to other drugs, medicaments and biological substances status: Secondary | ICD-10-CM | POA: Diagnosis not present

## 2023-06-20 DIAGNOSIS — G4733 Obstructive sleep apnea (adult) (pediatric): Secondary | ICD-10-CM | POA: Diagnosis not present

## 2023-06-20 NOTE — Progress Notes (Signed)
 Dominique Kerr

## 2023-06-20 NOTE — Progress Notes (Signed)
 Tumor Board Documentation  [facility] was presented by Dr. Randy Buttery at our Tumor Board on 06/20/2023, which included representatives from medical oncology, radiation oncology, surgical, radiology, pathology, navigation.  Dominique Kerr currently presents as a current patient, for MDC with history of the following treatments: surgical intervention(s).  Additionally, we reviewed previous medical and familial history, history of present illness, and recent lab results along with all available histopathologic and imaging studies. The tumor board considered available treatment options and made the following recommendations: Adjuvant radiation, Hormonal therapy    The following procedures/referrals were also placed: No orders of the defined types were placed in this encounter.   Clinical Trial Status: not discussed   Staging used: AJCC Staging: T: Tis          National site-specific guidelines   were discussed with respect to the case.  Tumor board is a meeting of clinicians from various specialty areas who evaluate and discuss patients for whom a multidisciplinary approach is being considered. Final determinations in the plan of care are those of the provider(s). The responsibility for follow up of recommendations given during tumor board is that of the provider.   Today's extended care, comprehensive team conference, Dominique Kerr was not present for the discussion and was not examined.   Multidisciplinary Tumor Board is a multidisciplinary case peer review process.  Decisions discussed in the Multidisciplinary Tumor Board reflect the opinions of the specialists present at the conference without having examined the patient.  Ultimately, treatment and diagnostic decisions rest with the primary provider(s) and the patient.

## 2023-06-23 ENCOUNTER — Other Ambulatory Visit: Payer: Self-pay | Admitting: *Deleted

## 2023-06-23 DIAGNOSIS — Z8261 Family history of arthritis: Secondary | ICD-10-CM | POA: Insufficient documentation

## 2023-06-23 DIAGNOSIS — G4733 Obstructive sleep apnea (adult) (pediatric): Secondary | ICD-10-CM | POA: Insufficient documentation

## 2023-06-23 DIAGNOSIS — Z86018 Personal history of other benign neoplasm: Secondary | ICD-10-CM | POA: Insufficient documentation

## 2023-06-23 DIAGNOSIS — Z87891 Personal history of nicotine dependence: Secondary | ICD-10-CM | POA: Insufficient documentation

## 2023-06-23 DIAGNOSIS — E785 Hyperlipidemia, unspecified: Secondary | ICD-10-CM | POA: Insufficient documentation

## 2023-06-23 DIAGNOSIS — E039 Hypothyroidism, unspecified: Secondary | ICD-10-CM | POA: Insufficient documentation

## 2023-06-23 DIAGNOSIS — E669 Obesity, unspecified: Secondary | ICD-10-CM | POA: Insufficient documentation

## 2023-06-23 DIAGNOSIS — D0511 Intraductal carcinoma in situ of right breast: Secondary | ICD-10-CM | POA: Insufficient documentation

## 2023-06-23 DIAGNOSIS — Z510 Encounter for antineoplastic radiation therapy: Secondary | ICD-10-CM | POA: Diagnosis present

## 2023-06-23 DIAGNOSIS — Z79811 Long term (current) use of aromatase inhibitors: Secondary | ICD-10-CM | POA: Insufficient documentation

## 2023-06-23 DIAGNOSIS — Z9049 Acquired absence of other specified parts of digestive tract: Secondary | ICD-10-CM | POA: Insufficient documentation

## 2023-06-23 DIAGNOSIS — I10 Essential (primary) hypertension: Secondary | ICD-10-CM | POA: Insufficient documentation

## 2023-06-23 DIAGNOSIS — Z888 Allergy status to other drugs, medicaments and biological substances status: Secondary | ICD-10-CM | POA: Insufficient documentation

## 2023-06-23 DIAGNOSIS — Z833 Family history of diabetes mellitus: Secondary | ICD-10-CM | POA: Insufficient documentation

## 2023-06-23 DIAGNOSIS — Z79899 Other long term (current) drug therapy: Secondary | ICD-10-CM | POA: Insufficient documentation

## 2023-06-23 DIAGNOSIS — F419 Anxiety disorder, unspecified: Secondary | ICD-10-CM | POA: Insufficient documentation

## 2023-06-23 NOTE — Progress Notes (Signed)
 cbc

## 2023-06-27 ENCOUNTER — Ambulatory Visit
Admission: RE | Admit: 2023-06-27 | Discharge: 2023-06-27 | Disposition: A | Discharge status: Home / Self Care | Source: Ambulatory Visit | Attending: Radiation Oncology | Admitting: Radiation Oncology

## 2023-06-27 DIAGNOSIS — D0511 Intraductal carcinoma in situ of right breast: Secondary | ICD-10-CM | POA: Diagnosis not present

## 2023-06-28 ENCOUNTER — Ambulatory Visit
Admission: RE | Admit: 2023-06-28 | Discharge: 2023-06-28 | Disposition: A | Discharge status: Home / Self Care | Source: Ambulatory Visit | Attending: Radiation Oncology | Admitting: Radiation Oncology

## 2023-06-28 ENCOUNTER — Other Ambulatory Visit: Payer: Self-pay

## 2023-06-28 DIAGNOSIS — D0511 Intraductal carcinoma in situ of right breast: Secondary | ICD-10-CM | POA: Diagnosis not present

## 2023-06-28 LAB — RAD ONC ARIA SESSION SUMMARY
Course Elapsed Days: 0
Plan Fractions Treated to Date: 1
Plan Prescribed Dose Per Fraction: 2.66 Gy
Plan Total Fractions Prescribed: 16
Plan Total Prescribed Dose: 42.56 Gy
Reference Point Dosage Given to Date: 2.66 Gy
Reference Point Session Dosage Given: 2.66 Gy
Session Number: 1

## 2023-06-29 ENCOUNTER — Ambulatory Visit
Admission: RE | Admit: 2023-06-29 | Discharge: 2023-06-29 | Discharge status: Home / Self Care | Source: Ambulatory Visit | Attending: Radiation Oncology | Admitting: Radiation Oncology

## 2023-06-29 ENCOUNTER — Inpatient Hospital Stay: Attending: Oncology

## 2023-06-29 ENCOUNTER — Other Ambulatory Visit: Payer: Self-pay

## 2023-06-29 DIAGNOSIS — D0511 Intraductal carcinoma in situ of right breast: Secondary | ICD-10-CM | POA: Insufficient documentation

## 2023-06-29 DIAGNOSIS — Z510 Encounter for antineoplastic radiation therapy: Secondary | ICD-10-CM | POA: Insufficient documentation

## 2023-06-29 LAB — RAD ONC ARIA SESSION SUMMARY
Course Elapsed Days: 1
Plan Fractions Treated to Date: 2
Plan Prescribed Dose Per Fraction: 2.66 Gy
Plan Total Fractions Prescribed: 16
Plan Total Prescribed Dose: 42.56 Gy
Reference Point Dosage Given to Date: 5.32 Gy
Reference Point Session Dosage Given: 2.66 Gy
Session Number: 2

## 2023-06-29 NOTE — Progress Notes (Signed)
 Multidisciplinary Oncology Council Documentation  Wanza Aybar was presented by our [facility] on 06/29/2023, which included representatives from:  Palliative Care Dietitian  Physical/Occupational Therapist Nurse Navigator Genetics Social work Survivorship RN Financial Navigator Research RN   Zoe currently presents with history of breast cancer  We reviewed previous medical and familial history, history of present illness, and recent lab results along with all available histopathologic and imaging studies. The MOC considered available treatment options and made the following recommendations/referrals:  Rehab screening  The MOC is a meeting of clinicians from various specialty areas who evaluate and discuss patients for whom a multidisciplinary approach is being considered. Final determinations in the plan of care are those of the provider(s).   Today's extended care, comprehensive team conference, Khaia was not present for the discussion and was not examined.

## 2023-06-30 ENCOUNTER — Ambulatory Visit
Admission: RE | Admit: 2023-06-30 | Discharge: 2023-06-30 | Disposition: A | Discharge status: Home / Self Care | Source: Ambulatory Visit | Attending: Radiation Oncology | Admitting: Radiation Oncology

## 2023-06-30 ENCOUNTER — Other Ambulatory Visit: Payer: Self-pay

## 2023-06-30 DIAGNOSIS — D0511 Intraductal carcinoma in situ of right breast: Secondary | ICD-10-CM | POA: Diagnosis not present

## 2023-06-30 LAB — RAD ONC ARIA SESSION SUMMARY
Course Elapsed Days: 2
Plan Fractions Treated to Date: 3
Plan Prescribed Dose Per Fraction: 2.66 Gy
Plan Total Fractions Prescribed: 16
Plan Total Prescribed Dose: 42.56 Gy
Reference Point Dosage Given to Date: 7.98 Gy
Reference Point Session Dosage Given: 2.66 Gy
Session Number: 3

## 2023-07-01 ENCOUNTER — Other Ambulatory Visit: Payer: Self-pay

## 2023-07-01 ENCOUNTER — Ambulatory Visit
Admission: RE | Admit: 2023-07-01 | Discharge: 2023-07-01 | Disposition: A | Discharge status: Home / Self Care | Source: Ambulatory Visit | Attending: Radiation Oncology | Admitting: Radiation Oncology

## 2023-07-01 DIAGNOSIS — D0511 Intraductal carcinoma in situ of right breast: Secondary | ICD-10-CM | POA: Diagnosis not present

## 2023-07-01 LAB — RAD ONC ARIA SESSION SUMMARY
Course Elapsed Days: 3
Plan Fractions Treated to Date: 4
Plan Prescribed Dose Per Fraction: 2.66 Gy
Plan Total Fractions Prescribed: 16
Plan Total Prescribed Dose: 42.56 Gy
Reference Point Dosage Given to Date: 10.64 Gy
Reference Point Session Dosage Given: 2.66 Gy
Session Number: 4

## 2023-07-04 ENCOUNTER — Inpatient Hospital Stay

## 2023-07-04 ENCOUNTER — Ambulatory Visit
Admission: RE | Admit: 2023-07-04 | Discharge: 2023-07-04 | Disposition: A | Discharge status: Home / Self Care | Source: Ambulatory Visit | Attending: Radiation Oncology | Admitting: Radiation Oncology

## 2023-07-04 ENCOUNTER — Other Ambulatory Visit: Payer: Self-pay

## 2023-07-04 DIAGNOSIS — D0511 Intraductal carcinoma in situ of right breast: Secondary | ICD-10-CM | POA: Diagnosis not present

## 2023-07-04 LAB — RAD ONC ARIA SESSION SUMMARY
Course Elapsed Days: 6
Plan Fractions Treated to Date: 5
Plan Prescribed Dose Per Fraction: 2.66 Gy
Plan Total Fractions Prescribed: 16
Plan Total Prescribed Dose: 42.56 Gy
Reference Point Dosage Given to Date: 13.3 Gy
Reference Point Session Dosage Given: 2.66 Gy
Session Number: 5

## 2023-07-04 LAB — CBC (CANCER CENTER ONLY)
HCT: 35.2 % — ABNORMAL LOW (ref 36.0–46.0)
Hemoglobin: 11.1 g/dL — ABNORMAL LOW (ref 12.0–15.0)
MCH: 27.5 pg (ref 26.0–34.0)
MCHC: 31.5 g/dL (ref 30.0–36.0)
MCV: 87.1 fL (ref 80.0–100.0)
Platelet Count: 262 10*3/uL (ref 150–400)
RBC: 4.04 MIL/uL (ref 3.87–5.11)
RDW: 14.2 % (ref 11.5–15.5)
WBC Count: 5.4 10*3/uL (ref 4.0–10.5)
nRBC: 0 % (ref 0.0–0.2)

## 2023-07-04 NOTE — Progress Notes (Signed)
 [facility] Clinical Social Work  Clinical Social Work was referred by [facility] for assessment of psychosocial needs.  Clinical Social Worker contacted patient by phone to offer support and assess for needs.    Patient stated everything was going well and that she did not have any needs at this time.  Discussed the [employer].  CSW gave RN massage certificates to give to patient today, along with CSW contact information.     Kennth Peal, LCSW  Clinical Social Worker [facility]

## 2023-07-05 ENCOUNTER — Other Ambulatory Visit: Payer: Self-pay

## 2023-07-05 ENCOUNTER — Ambulatory Visit
Admission: RE | Admit: 2023-07-05 | Discharge: 2023-07-05 | Disposition: A | Discharge status: Home / Self Care | Source: Ambulatory Visit | Attending: Radiation Oncology | Admitting: Radiation Oncology

## 2023-07-05 DIAGNOSIS — D0511 Intraductal carcinoma in situ of right breast: Secondary | ICD-10-CM | POA: Diagnosis not present

## 2023-07-05 LAB — RAD ONC ARIA SESSION SUMMARY
Course Elapsed Days: 7
Plan Fractions Treated to Date: 6
Plan Prescribed Dose Per Fraction: 2.66 Gy
Plan Total Fractions Prescribed: 16
Plan Total Prescribed Dose: 42.56 Gy
Reference Point Dosage Given to Date: 15.96 Gy
Reference Point Session Dosage Given: 2.66 Gy
Session Number: 6

## 2023-07-06 ENCOUNTER — Other Ambulatory Visit: Payer: Self-pay

## 2023-07-06 ENCOUNTER — Ambulatory Visit
Admission: RE | Admit: 2023-07-06 | Discharge: 2023-07-06 | Disposition: A | Discharge status: Home / Self Care | Source: Ambulatory Visit | Attending: Radiation Oncology | Admitting: Radiation Oncology

## 2023-07-06 DIAGNOSIS — D0511 Intraductal carcinoma in situ of right breast: Secondary | ICD-10-CM | POA: Diagnosis not present

## 2023-07-06 LAB — RAD ONC ARIA SESSION SUMMARY
Course Elapsed Days: 8
Plan Fractions Treated to Date: 7
Plan Prescribed Dose Per Fraction: 2.66 Gy
Plan Total Fractions Prescribed: 16
Plan Total Prescribed Dose: 42.56 Gy
Reference Point Dosage Given to Date: 18.62 Gy
Reference Point Session Dosage Given: 2.66 Gy
Session Number: 7

## 2023-07-07 ENCOUNTER — Other Ambulatory Visit: Payer: Self-pay

## 2023-07-07 ENCOUNTER — Ambulatory Visit
Admission: RE | Admit: 2023-07-07 | Discharge: 2023-07-07 | Disposition: A | Discharge status: Home / Self Care | Source: Ambulatory Visit | Attending: Radiation Oncology | Admitting: Radiation Oncology

## 2023-07-07 DIAGNOSIS — D0511 Intraductal carcinoma in situ of right breast: Secondary | ICD-10-CM | POA: Diagnosis not present

## 2023-07-07 LAB — RAD ONC ARIA SESSION SUMMARY
Course Elapsed Days: 9
Plan Fractions Treated to Date: 8
Plan Prescribed Dose Per Fraction: 2.66 Gy
Plan Total Fractions Prescribed: 16
Plan Total Prescribed Dose: 42.56 Gy
Reference Point Dosage Given to Date: 21.28 Gy
Reference Point Session Dosage Given: 2.66 Gy
Session Number: 8

## 2023-07-08 ENCOUNTER — Inpatient Hospital Stay

## 2023-07-08 ENCOUNTER — Ambulatory Visit
Admission: RE | Admit: 2023-07-08 | Discharge: 2023-07-08 | Disposition: A | Discharge status: Home / Self Care | Source: Ambulatory Visit | Attending: Radiation Oncology | Admitting: Radiation Oncology

## 2023-07-08 ENCOUNTER — Other Ambulatory Visit: Payer: Self-pay

## 2023-07-08 DIAGNOSIS — D0511 Intraductal carcinoma in situ of right breast: Secondary | ICD-10-CM | POA: Diagnosis not present

## 2023-07-08 LAB — RAD ONC ARIA SESSION SUMMARY
Course Elapsed Days: 10
Plan Fractions Treated to Date: 9
Plan Prescribed Dose Per Fraction: 2.66 Gy
Plan Total Fractions Prescribed: 16
Plan Total Prescribed Dose: 42.56 Gy
Reference Point Dosage Given to Date: 23.94 Gy
Reference Point Session Dosage Given: 2.66 Gy
Session Number: 9

## 2023-07-08 NOTE — Progress Notes (Signed)
 [facility] CSW Progress Note  Clinical Child psychotherapist contacted patient by phone to assess needs.  She inquired about the massage certificates.  Provided information regarding the process of scheduling the massages and that she has to wait two weeks after her last radiation to get the massage per Dr. Jacalyn Martin.  She said she understood and will pick them up at the radiation desk next Monday, 5/29.    Kennth Peal, LCSW Clinical Social Worker [facility]

## 2023-07-11 ENCOUNTER — Ambulatory Visit
Admission: RE | Admit: 2023-07-11 | Discharge: 2023-07-11 | Disposition: A | Discharge status: Home / Self Care | Source: Ambulatory Visit | Attending: Radiation Oncology | Admitting: Radiation Oncology

## 2023-07-11 ENCOUNTER — Other Ambulatory Visit: Payer: Self-pay

## 2023-07-11 DIAGNOSIS — D0511 Intraductal carcinoma in situ of right breast: Secondary | ICD-10-CM | POA: Diagnosis not present

## 2023-07-11 LAB — RAD ONC ARIA SESSION SUMMARY
Course Elapsed Days: 13
Plan Fractions Treated to Date: 10
Plan Prescribed Dose Per Fraction: 2.66 Gy
Plan Total Fractions Prescribed: 16
Plan Total Prescribed Dose: 42.56 Gy
Reference Point Dosage Given to Date: 26.6 Gy
Reference Point Session Dosage Given: 2.66 Gy
Session Number: 10

## 2023-07-12 ENCOUNTER — Ambulatory Visit
Admission: RE | Admit: 2023-07-12 | Discharge: 2023-07-12 | Disposition: A | Discharge status: Home / Self Care | Source: Ambulatory Visit | Attending: Radiation Oncology | Admitting: Radiation Oncology

## 2023-07-12 ENCOUNTER — Other Ambulatory Visit: Payer: Self-pay

## 2023-07-12 DIAGNOSIS — D0511 Intraductal carcinoma in situ of right breast: Secondary | ICD-10-CM | POA: Diagnosis not present

## 2023-07-12 LAB — RAD ONC ARIA SESSION SUMMARY
Course Elapsed Days: 14
Plan Fractions Treated to Date: 11
Plan Prescribed Dose Per Fraction: 2.66 Gy
Plan Total Fractions Prescribed: 16
Plan Total Prescribed Dose: 42.56 Gy
Reference Point Dosage Given to Date: 29.26 Gy
Reference Point Session Dosage Given: 2.66 Gy
Session Number: 11

## 2023-07-13 ENCOUNTER — Other Ambulatory Visit: Payer: Self-pay

## 2023-07-13 ENCOUNTER — Ambulatory Visit
Admission: RE | Admit: 2023-07-13 | Discharge: 2023-07-13 | Disposition: A | Discharge status: Home / Self Care | Source: Ambulatory Visit | Attending: Radiation Oncology | Admitting: Radiation Oncology

## 2023-07-13 DIAGNOSIS — D0511 Intraductal carcinoma in situ of right breast: Secondary | ICD-10-CM | POA: Diagnosis not present

## 2023-07-13 LAB — RAD ONC ARIA SESSION SUMMARY
Course Elapsed Days: 15
Plan Fractions Treated to Date: 12
Plan Prescribed Dose Per Fraction: 2.66 Gy
Plan Total Fractions Prescribed: 16
Plan Total Prescribed Dose: 42.56 Gy
Reference Point Dosage Given to Date: 31.92 Gy
Reference Point Session Dosage Given: 2.66 Gy
Session Number: 12

## 2023-07-14 ENCOUNTER — Ambulatory Visit
Admission: RE | Admit: 2023-07-14 | Discharge: 2023-07-14 | Disposition: A | Discharge status: Home / Self Care | Source: Ambulatory Visit | Attending: Radiation Oncology | Admitting: Radiation Oncology

## 2023-07-14 ENCOUNTER — Other Ambulatory Visit: Payer: Self-pay

## 2023-07-14 DIAGNOSIS — D0511 Intraductal carcinoma in situ of right breast: Secondary | ICD-10-CM | POA: Diagnosis not present

## 2023-07-14 LAB — RAD ONC ARIA SESSION SUMMARY
Course Elapsed Days: 16
Plan Fractions Treated to Date: 13
Plan Prescribed Dose Per Fraction: 2.66 Gy
Plan Total Fractions Prescribed: 16
Plan Total Prescribed Dose: 42.56 Gy
Reference Point Dosage Given to Date: 34.58 Gy
Reference Point Session Dosage Given: 2.66 Gy
Session Number: 13

## 2023-07-15 ENCOUNTER — Ambulatory Visit
Admission: RE | Admit: 2023-07-15 | Discharge: 2023-07-15 | Disposition: A | Discharge status: Home / Self Care | Source: Ambulatory Visit | Attending: Radiation Oncology | Admitting: Radiation Oncology

## 2023-07-15 ENCOUNTER — Other Ambulatory Visit: Payer: Self-pay

## 2023-07-15 DIAGNOSIS — D0511 Intraductal carcinoma in situ of right breast: Secondary | ICD-10-CM | POA: Diagnosis not present

## 2023-07-15 LAB — RAD ONC ARIA SESSION SUMMARY
Course Elapsed Days: 17
Plan Fractions Treated to Date: 14
Plan Prescribed Dose Per Fraction: 2.66 Gy
Plan Total Fractions Prescribed: 16
Plan Total Prescribed Dose: 42.56 Gy
Reference Point Dosage Given to Date: 37.24 Gy
Reference Point Session Dosage Given: 2.66 Gy
Session Number: 14

## 2023-07-19 ENCOUNTER — Ambulatory Visit
Admission: RE | Admit: 2023-07-19 | Discharge: 2023-07-19 | Disposition: A | Discharge status: Home / Self Care | Source: Ambulatory Visit | Attending: Radiation Oncology | Admitting: Radiation Oncology

## 2023-07-19 ENCOUNTER — Other Ambulatory Visit: Payer: Self-pay

## 2023-07-19 ENCOUNTER — Inpatient Hospital Stay

## 2023-07-19 DIAGNOSIS — D0511 Intraductal carcinoma in situ of right breast: Secondary | ICD-10-CM

## 2023-07-19 LAB — CBC (CANCER CENTER ONLY)
HCT: 35.2 % — ABNORMAL LOW (ref 36.0–46.0)
Hemoglobin: 11.1 g/dL — ABNORMAL LOW (ref 12.0–15.0)
MCH: 27.3 pg (ref 26.0–34.0)
MCHC: 31.5 g/dL (ref 30.0–36.0)
MCV: 86.7 fL (ref 80.0–100.0)
Platelet Count: 250 10*3/uL (ref 150–400)
RBC: 4.06 MIL/uL (ref 3.87–5.11)
RDW: 14.1 % (ref 11.5–15.5)
WBC Count: 5.2 10*3/uL (ref 4.0–10.5)
nRBC: 0 % (ref 0.0–0.2)

## 2023-07-19 LAB — RAD ONC ARIA SESSION SUMMARY
Course Elapsed Days: 21
Plan Fractions Treated to Date: 15
Plan Prescribed Dose Per Fraction: 2.66 Gy
Plan Total Fractions Prescribed: 16
Plan Total Prescribed Dose: 42.56 Gy
Reference Point Dosage Given to Date: 39.9 Gy
Reference Point Session Dosage Given: 2.66 Gy
Session Number: 15

## 2023-07-20 ENCOUNTER — Ambulatory Visit
Admission: RE | Admit: 2023-07-20 | Discharge: 2023-07-20 | Disposition: A | Discharge status: Home / Self Care | Source: Ambulatory Visit | Attending: Radiation Oncology | Admitting: Radiation Oncology

## 2023-07-20 ENCOUNTER — Other Ambulatory Visit: Payer: Self-pay

## 2023-07-20 DIAGNOSIS — D0511 Intraductal carcinoma in situ of right breast: Secondary | ICD-10-CM | POA: Diagnosis not present

## 2023-07-20 LAB — RAD ONC ARIA SESSION SUMMARY
Course Elapsed Days: 22
Plan Fractions Treated to Date: 16
Plan Prescribed Dose Per Fraction: 2.66 Gy
Plan Total Fractions Prescribed: 16
Plan Total Prescribed Dose: 42.56 Gy
Reference Point Dosage Given to Date: 42.56 Gy
Reference Point Session Dosage Given: 2.66 Gy
Session Number: 16

## 2023-07-21 ENCOUNTER — Other Ambulatory Visit: Payer: Self-pay

## 2023-07-21 ENCOUNTER — Ambulatory Visit
Admission: RE | Admit: 2023-07-21 | Discharge: 2023-07-21 | Disposition: A | Discharge status: Home / Self Care | Source: Ambulatory Visit | Attending: Radiation Oncology | Admitting: Radiation Oncology

## 2023-07-21 DIAGNOSIS — D0511 Intraductal carcinoma in situ of right breast: Secondary | ICD-10-CM | POA: Diagnosis not present

## 2023-07-21 LAB — RAD ONC ARIA SESSION SUMMARY
Course Elapsed Days: 23
Plan Fractions Treated to Date: 1
Plan Prescribed Dose Per Fraction: 2 Gy
Plan Total Fractions Prescribed: 5
Plan Total Prescribed Dose: 10 Gy
Reference Point Dosage Given to Date: 2 Gy
Reference Point Session Dosage Given: 2 Gy
Session Number: 17

## 2023-07-22 ENCOUNTER — Other Ambulatory Visit: Payer: Self-pay

## 2023-07-22 ENCOUNTER — Ambulatory Visit
Admission: RE | Admit: 2023-07-22 | Discharge: 2023-07-22 | Disposition: A | Discharge status: Home / Self Care | Source: Ambulatory Visit | Attending: Radiation Oncology | Admitting: Radiation Oncology

## 2023-07-22 DIAGNOSIS — D0511 Intraductal carcinoma in situ of right breast: Secondary | ICD-10-CM | POA: Diagnosis not present

## 2023-07-22 LAB — RAD ONC ARIA SESSION SUMMARY
Course Elapsed Days: 24
Plan Fractions Treated to Date: 2
Plan Prescribed Dose Per Fraction: 2 Gy
Plan Total Fractions Prescribed: 5
Plan Total Prescribed Dose: 10 Gy
Reference Point Dosage Given to Date: 4 Gy
Reference Point Session Dosage Given: 2 Gy
Session Number: 18

## 2023-07-25 ENCOUNTER — Ambulatory Visit
Admission: RE | Admit: 2023-07-25 | Discharge: 2023-07-25 | Disposition: A | Discharge status: Home / Self Care | Source: Ambulatory Visit | Attending: Radiation Oncology | Admitting: Radiation Oncology

## 2023-07-25 ENCOUNTER — Other Ambulatory Visit: Payer: Self-pay

## 2023-07-25 DIAGNOSIS — Z86018 Personal history of other benign neoplasm: Secondary | ICD-10-CM | POA: Diagnosis not present

## 2023-07-25 DIAGNOSIS — G4733 Obstructive sleep apnea (adult) (pediatric): Secondary | ICD-10-CM | POA: Diagnosis not present

## 2023-07-25 DIAGNOSIS — D0511 Intraductal carcinoma in situ of right breast: Secondary | ICD-10-CM | POA: Diagnosis present

## 2023-07-25 DIAGNOSIS — E785 Hyperlipidemia, unspecified: Secondary | ICD-10-CM | POA: Insufficient documentation

## 2023-07-25 DIAGNOSIS — Z79811 Long term (current) use of aromatase inhibitors: Secondary | ICD-10-CM | POA: Diagnosis not present

## 2023-07-25 DIAGNOSIS — Z888 Allergy status to other drugs, medicaments and biological substances status: Secondary | ICD-10-CM | POA: Insufficient documentation

## 2023-07-25 DIAGNOSIS — Z87891 Personal history of nicotine dependence: Secondary | ICD-10-CM | POA: Insufficient documentation

## 2023-07-25 DIAGNOSIS — I10 Essential (primary) hypertension: Secondary | ICD-10-CM | POA: Insufficient documentation

## 2023-07-25 DIAGNOSIS — Z9049 Acquired absence of other specified parts of digestive tract: Secondary | ICD-10-CM | POA: Diagnosis not present

## 2023-07-25 DIAGNOSIS — E039 Hypothyroidism, unspecified: Secondary | ICD-10-CM | POA: Diagnosis not present

## 2023-07-25 DIAGNOSIS — F419 Anxiety disorder, unspecified: Secondary | ICD-10-CM | POA: Diagnosis not present

## 2023-07-25 DIAGNOSIS — E669 Obesity, unspecified: Secondary | ICD-10-CM | POA: Insufficient documentation

## 2023-07-25 DIAGNOSIS — Z833 Family history of diabetes mellitus: Secondary | ICD-10-CM | POA: Insufficient documentation

## 2023-07-25 DIAGNOSIS — Z79899 Other long term (current) drug therapy: Secondary | ICD-10-CM | POA: Diagnosis not present

## 2023-07-25 DIAGNOSIS — Z8261 Family history of arthritis: Secondary | ICD-10-CM | POA: Insufficient documentation

## 2023-07-25 LAB — RAD ONC ARIA SESSION SUMMARY
Course Elapsed Days: 27
Plan Fractions Treated to Date: 3
Plan Prescribed Dose Per Fraction: 2 Gy
Plan Total Fractions Prescribed: 5
Plan Total Prescribed Dose: 10 Gy
Reference Point Dosage Given to Date: 6 Gy
Reference Point Session Dosage Given: 2 Gy
Session Number: 19

## 2023-07-26 ENCOUNTER — Other Ambulatory Visit: Payer: Self-pay

## 2023-07-26 ENCOUNTER — Ambulatory Visit
Admission: RE | Admit: 2023-07-26 | Discharge: 2023-07-26 | Disposition: A | Discharge status: Home / Self Care | Source: Ambulatory Visit | Attending: Radiation Oncology | Admitting: Radiation Oncology

## 2023-07-26 DIAGNOSIS — D0511 Intraductal carcinoma in situ of right breast: Secondary | ICD-10-CM | POA: Diagnosis not present

## 2023-07-26 LAB — RAD ONC ARIA SESSION SUMMARY
Course Elapsed Days: 28
Plan Fractions Treated to Date: 4
Plan Prescribed Dose Per Fraction: 2 Gy
Plan Total Fractions Prescribed: 5
Plan Total Prescribed Dose: 10 Gy
Reference Point Dosage Given to Date: 8 Gy
Reference Point Session Dosage Given: 2 Gy
Session Number: 20

## 2023-07-27 ENCOUNTER — Ambulatory Visit
Admission: RE | Admit: 2023-07-27 | Discharge: 2023-07-27 | Disposition: A | Discharge status: Home / Self Care | Source: Ambulatory Visit | Attending: Radiation Oncology | Admitting: Radiation Oncology

## 2023-07-27 ENCOUNTER — Other Ambulatory Visit: Payer: Self-pay

## 2023-07-27 ENCOUNTER — Encounter: Payer: Self-pay | Admitting: *Deleted

## 2023-07-27 DIAGNOSIS — D0511 Intraductal carcinoma in situ of right breast: Secondary | ICD-10-CM | POA: Diagnosis not present

## 2023-07-27 LAB — RAD ONC ARIA SESSION SUMMARY
Course Elapsed Days: 29
Plan Fractions Treated to Date: 5
Plan Prescribed Dose Per Fraction: 2 Gy
Plan Total Fractions Prescribed: 5
Plan Total Prescribed Dose: 10 Gy
Reference Point Dosage Given to Date: 10 Gy
Reference Point Session Dosage Given: 2 Gy
Session Number: 21

## 2023-07-28 NOTE — Radiation Completion Notes (Signed)
 Patient Name: Dominique Kerr, REVERE MRN: [id number] Date of Birth: Dec 06, 1960 Referring Physician: JOLENE CANNADY, M.D. Date of Service: 2023-07-28 Radiation Oncologist: Glenis Langdon, M.D. [facility]                             RADIATION ONCOLOGY END OF TREATMENT NOTE     Diagnosis: D05.11 Intraductal carcinoma in situ of right breast Staging on 2023-06-15: Ductal carcinoma in situ (DCIS) of right breast T=pTis (DCIS), N=pNX, M=cM0 Intent: Curative     ==========DELIVERED PLANS==========  First Treatment Date: 2023-06-28 Last Treatment Date: 2023-07-27   Plan Name: Breast_R Site: Breast, Right Technique: 3D Mode: Photon Dose Per Fraction: 2.66 Gy Prescribed Dose (Delivered / Prescribed): 42.56 Gy / 42.56 Gy Prescribed Fxs (Delivered / Prescribed): 16 / 16   Plan Name: Breast_R_Bst Site: Breast, Right Technique: 3D Mode: Photon Dose Per Fraction: 2 Gy Prescribed Dose (Delivered / Prescribed): 10 Gy / 10 Gy Prescribed Fxs (Delivered / Prescribed): 5 / 5     ==========ON TREATMENT VISIT DATES========== 2023-06-28, 2023-07-05, 2023-07-12, 2023-07-19, 2023-07-26     ==========UPCOMING VISITS==========       ==========APPENDIX - ON TREATMENT VISIT NOTES==========   See weekly On Treatment Notes in Epic for details in the Media tab (listed as Progress notes on the On Treatment Visit Dates listed above).

## 2023-08-10 ENCOUNTER — Other Ambulatory Visit: Payer: Self-pay | Admitting: Nurse Practitioner

## 2023-08-12 NOTE — Telephone Encounter (Signed)
 Requested medications are due for refill today.  yes  Requested medications are on the active medications list.  yes  Last refill. 11/22/2022 #90 2 rf  Future visit scheduled.   yes  Notes to clinic.  Pharmacy comment: Alternative Requested:OKAY TO CHANGE MANUFACTURES.    Requested Prescriptions  Pending Prescriptions Disp Refills   levothyroxine  (SYNTHROID ) 125 MCG tablet [Pharmacy Med Name: LEVOTHYROXINE  125 MCG TABLET] 90 tablet 2    Sig: TAKE 1 TABLET BY MOUTH EVERY DAY     Endocrinology:  Hypothyroid Agents Failed - 08/12/2023 12:01 PM      Failed - Valid encounter within last 12 months    Recent Outpatient Visits   None     Future Appointments             In 2 weeks Cannady, Lavelle Posey, NP [place] [facility]            Passed - TSH in normal range and within 360 days    TSH  Date Value Ref Range Status  10/04/2022 1.710 0.450 - 4.500 uIU/mL Final

## 2023-08-27 DIAGNOSIS — K449 Diaphragmatic hernia without obstruction or gangrene: Secondary | ICD-10-CM | POA: Insufficient documentation

## 2023-08-27 DIAGNOSIS — K760 Fatty (change of) liver, not elsewhere classified: Secondary | ICD-10-CM | POA: Insufficient documentation

## 2023-08-27 NOTE — Patient Instructions (Incomplete)
 Be Involved in Caring For Your Health:  Taking Medications When medications are taken as directed, they can greatly improve your health. But if they are not taken as prescribed, they may not work. In some cases, not taking them correctly can be harmful. To help ensure your treatment remains effective and safe, understand your medications and how to take them. Bring your medications to each visit for review by your provider.  Your lab results, notes, and after visit summary will be available on My Chart. We strongly encourage you to use this feature. If lab results are abnormal the clinic will contact you with the appropriate steps. If the clinic does not contact you assume the results are satisfactory. You can always view your results on My Chart. If you have questions regarding your health or results, please contact the clinic during office hours. You can also ask questions on My Chart.  We at [facility] are grateful that you chose [place] to provide your care. We strive to provide evidence-based and compassionate care and are always looking for feedback. If you get a survey from the clinic please complete this so we can hear your opinions.  Heart-Healthy Eating Plan Many factors influence your heart health, including eating and exercise habits. Heart health is also called coronary health. Coronary risk increases with abnormal blood fat (lipid) levels. A heart-healthy eating plan includes limiting unhealthy fats, increasing healthy fats, limiting salt (sodium) intake, and making other diet and lifestyle changes. What is my plan? Your health care provider may recommend that: You limit your fat intake to _________% or less of your total calories each day. You limit your saturated fat intake to _________% or less of your total calories each day. You limit the amount of cholesterol in your diet to less than _________ mg per day. You limit the amount of sodium in your diet to less than _________  mg per day. What are tips for following this plan? Cooking Cook foods using methods other than frying. Baking, boiling, grilling, and broiling are all good options. Other ways to reduce fat include: Removing the skin from poultry. Removing all visible fats from meats. Steaming vegetables in water or broth. Meal planning  At meals, imagine dividing your plate into fourths: Fill one-half of your plate with vegetables and green salads. Fill one-fourth of your plate with whole grains. Fill one-fourth of your plate with lean protein foods. Eat 2-4 cups of vegetables per day. One cup of vegetables equals 1 cup (91 g) broccoli or cauliflower florets, 2 medium carrots, 1 large bell pepper, 1 large sweet potato, 1 large tomato, 1 medium white potato, 2 cups (150 g) raw leafy greens. Eat 1-2 cups of fruit per day. One cup of fruit equals 1 small apple, 1 large banana, 1 cup (237 g) mixed fruit, 1 large orange,  cup (82 g) dried fruit, 1 cup (240 mL) 100% fruit juice. Eat more foods that contain soluble fiber. Examples include apples, broccoli, carrots, beans, peas, and barley. Aim to get 25-30 g of fiber per day. Increase your consumption of legumes, nuts, and seeds to 4-5 servings per week. One serving of dried beans or legumes equals  cup (90 g) cooked, 1 serving of nuts is  oz (12 almonds, 24 pistachios, or 7 walnut halves), and 1 serving of seeds equals  oz (8 g). Fats Choose healthy fats more often. Choose monounsaturated and polyunsaturated fats, such as olive and canola oils, avocado oil, flaxseeds, walnuts, almonds, and seeds. Eat  more omega-3 fats. Choose salmon, mackerel, sardines, tuna, flaxseed oil, and ground flaxseeds. Aim to eat fish at least 2 times each week. Check food labels carefully to identify foods with trans fats or high amounts of saturated fat. Limit saturated fats. These are found in animal products, such as meats, butter, and cream. Plant sources of saturated fats  include palm oil, palm kernel oil, and coconut oil. Avoid foods with partially hydrogenated oils in them. These contain trans fats. Examples are stick margarine, some tub margarines, cookies, crackers, and other baked goods. Avoid fried foods. General information Eat more home-cooked food and less restaurant, buffet, and fast food. Limit or avoid alcohol. Limit foods that are high in added sugar and simple starches such as foods made using white refined flour (white breads, pastries, sweets). Lose weight if you are overweight. Losing just 5-10% of your body weight can help your overall health and prevent diseases such as diabetes and heart disease. Monitor your sodium intake, especially if you have high blood pressure. Talk with your health care provider about your sodium intake. Try to incorporate more vegetarian meals weekly. What foods should I eat? Fruits All fresh, canned (in natural juice), or frozen fruits. Vegetables Fresh or frozen vegetables (raw, steamed, roasted, or grilled). Green salads. Grains Most grains. Choose whole wheat and whole grains most of the time. Rice and pasta, including brown rice and pastas made with whole wheat. Meats and other proteins Lean, well-trimmed beef, veal, pork, and lamb. Chicken and [place] without skin. All fish and shellfish. Wild duck, rabbit, pheasant, and venison. Egg whites or low-cholesterol egg substitutes. Dried beans, peas, lentils, and tofu. Seeds and most nuts. Dairy Low-fat or nonfat cheeses, including ricotta and mozzarella. Skim or 1% milk (liquid, powdered, or evaporated). Buttermilk made with low-fat milk. Nonfat or low-fat yogurt. Fats and oils Non-hydrogenated (trans-free) margarines. Vegetable oils, including soybean, sesame, sunflower, olive, avocado, peanut, safflower, corn, canola, and cottonseed. Salad dressings or mayonnaise made with a vegetable oil. Beverages Water (mineral or sparkling). Coffee and tea. Unsweetened ice  tea. Diet beverages. Sweets and desserts Sherbet, gelatin, and fruit ice. Small amounts of dark chocolate. Limit all sweets and desserts. Seasonings and condiments All seasonings and condiments. The items listed above may not be a complete list of foods and beverages you can eat. Contact a dietitian for more options. What foods should I avoid? Fruits Canned fruit in heavy syrup. Fruit in cream or butter sauce. Fried fruit. Limit coconut. Vegetables Vegetables cooked in cheese, cream, or butter sauce. Fried vegetables. Grains Breads made with saturated or trans fats, oils, or whole milk. Croissants. Sweet rolls. Donuts. High-fat crackers, such as cheese crackers and chips. Meats and other proteins Fatty meats, such as hot dogs, ribs, sausage, bacon, rib-eye roast or steak. High-fat deli meats, such as salami and bologna. Caviar. Domestic duck and goose. Organ meats, such as liver. Dairy Cream, sour cream, cream cheese, and creamed cottage cheese. Whole-milk cheeses. Whole or 2% milk (liquid, evaporated, or condensed). Whole buttermilk. Cream sauce or high-fat cheese sauce. Whole-milk yogurt. Fats and oils Meat fat, or shortening. Cocoa butter, hydrogenated oils, palm oil, coconut oil, palm kernel oil. Solid fats and shortenings, including bacon fat, salt pork, lard, and butter. Nondairy cream substitutes. Salad dressings with cheese or sour cream. Beverages Regular sodas and any drinks with added sugar. Sweets and desserts Frosting. Pudding. Cookies. Cakes. Pies. Milk chocolate or white chocolate. Buttered syrups. Full-fat ice cream or ice cream drinks. The items listed above may  not be a complete list of foods and beverages to avoid. Contact a dietitian for more information. Summary Heart-healthy meal planning includes limiting unhealthy fats, increasing healthy fats, limiting salt (sodium) intake and making other diet and lifestyle changes. Lose weight if you are overweight. Losing just  5-10% of your body weight can help your overall health and prevent diseases such as diabetes and heart disease. Focus on eating a balance of foods, including fruits and vegetables, low-fat or nonfat dairy, lean protein, nuts and legumes, whole grains, and heart-healthy oils and fats. This information is not intended to replace advice given to you by your health care provider. Make sure you discuss any questions you have with your health care provider. Document Revised: 03/16/2021 Document Reviewed: 03/16/2021 Elsevier Patient Education  2024 [employer].

## 2023-08-30 ENCOUNTER — Encounter: Payer: Self-pay | Admitting: Nurse Practitioner

## 2023-08-30 ENCOUNTER — Ambulatory Visit: Payer: Self-pay | Admitting: Nurse Practitioner

## 2023-08-30 ENCOUNTER — Ambulatory Visit (INDEPENDENT_AMBULATORY_CARE_PROVIDER_SITE_OTHER): Payer: Self-pay | Admitting: Nurse Practitioner

## 2023-08-30 VITALS — BP 124/82 | HR 71 | Temp 98.5°F | Ht 66.0 in | Wt 235.8 lb

## 2023-08-30 DIAGNOSIS — Z79890 Hormone replacement therapy (postmenopausal): Secondary | ICD-10-CM

## 2023-08-30 DIAGNOSIS — F419 Anxiety disorder, unspecified: Secondary | ICD-10-CM | POA: Diagnosis not present

## 2023-08-30 DIAGNOSIS — E039 Hypothyroidism, unspecified: Secondary | ICD-10-CM | POA: Diagnosis not present

## 2023-08-30 DIAGNOSIS — F32A Depression, unspecified: Secondary | ICD-10-CM

## 2023-08-30 DIAGNOSIS — G4733 Obstructive sleep apnea (adult) (pediatric): Secondary | ICD-10-CM

## 2023-08-30 DIAGNOSIS — E7800 Pure hypercholesterolemia, unspecified: Secondary | ICD-10-CM | POA: Diagnosis not present

## 2023-08-30 DIAGNOSIS — K219 Gastro-esophageal reflux disease without esophagitis: Secondary | ICD-10-CM

## 2023-08-30 DIAGNOSIS — Z0000 Encounter for general adult medical examination without abnormal findings: Secondary | ICD-10-CM | POA: Diagnosis not present

## 2023-08-30 DIAGNOSIS — Z23 Encounter for immunization: Secondary | ICD-10-CM

## 2023-08-30 DIAGNOSIS — D0511 Intraductal carcinoma in situ of right breast: Secondary | ICD-10-CM | POA: Diagnosis not present

## 2023-08-30 DIAGNOSIS — E559 Vitamin D deficiency, unspecified: Secondary | ICD-10-CM

## 2023-08-30 DIAGNOSIS — J301 Allergic rhinitis due to pollen: Secondary | ICD-10-CM

## 2023-08-30 DIAGNOSIS — R7301 Impaired fasting glucose: Secondary | ICD-10-CM

## 2023-08-30 LAB — BAYER DCA HB A1C WAIVED: HB A1C (BAYER DCA - WAIVED): 6.3 % — ABNORMAL HIGH (ref 4.8–5.6)

## 2023-08-30 MED ORDER — ATORVASTATIN CALCIUM 40 MG PO TABS: 40.0000 mg | ORAL_TABLET | Freq: Every day | ORAL | 4 refills | Status: DC

## 2023-08-30 MED ORDER — SERTRALINE HCL 100 MG PO TABS: 100.0000 mg | ORAL_TABLET | Freq: Every day | ORAL | 4 refills | Status: AC

## 2023-08-30 MED ORDER — CELECOXIB 200 MG PO CAPS: 200.0000 mg | ORAL_CAPSULE | Freq: Every day | ORAL | 12 refills | Status: AC | PRN

## 2023-08-30 MED ORDER — OMEPRAZOLE 20 MG PO CPDR: DELAYED_RELEASE_CAPSULE | ORAL | 4 refills | Status: DC

## 2023-08-30 NOTE — Progress Notes (Signed)
 BP 124/82 (BP Location: Left Arm, Patient Position: Sitting, Cuff Size: Large)   Pulse 71   Temp 98.5 F (36.9 C) (Oral)   Ht 5' 6 (1.676 m)   Wt 235 lb 12.8 oz (107 kg)   LMP  (LMP Unknown)   SpO2 97%   BMI 38.06 kg/m    Subjective:    Patient ID: Dominique Kerr, female    DOB: 1961-02-11, 63 y.o.   MRN: [id number]  HPI: Cailie Bosshart is a 63 y.o. female presenting on 08/30/2023 for comprehensive medical examination. Current medical complaints include:none  She currently lives with: sister Menopausal Symptoms: no  Follows by GYN for hormone treatment, last visit on 03/30/23. This treatment was discontinued after breast cancer diagnosis 06/15/23.  Radiation treatment performed, with last session on 07/27/23. Last oncology visit was on 06/15/23. She is taking Femara .  Having issues with hoarseness and cough related to allergies.  Using nasal spray without much benefit.  HYPERLIPIDEMIA Continues on Atorvastatin  40 MG daily.  Joined [employer] yesterday. Diagnosed severe OSA in May 2023 and is using BiPAP every day. Last saw sleep study provider on 02/21/23. Hyperlipidemia status: stable Satisfied with current treatment?  no Side effects:  no Medication compliance: good compliance Supplements: none Aspirin:  yes The 10-year ASCVD risk score (Arnett DK, et al., 2019) is: 2.3%   Values used to calculate the score:     Age: 59 years     Clincally relevant sex: Female     Is Non-Hispanic African American: No     Diabetic: No     Tobacco smoker: No     Systolic Blood Pressure: 124 mmHg     Is BP treated: No     HDL Cholesterol: 92 mg/dL     Total Cholesterol: 210 mg/dL Chest pain:  no Coronary artery disease:  no Family history CAD:  no Family history early CAD:  no   Impaired Fasting Glucose HbA1C:  Lab Results  Component Value Date   HGBA1C 6.4 (H) 02/24/2023  Duration of elevated blood sugar: years Polydipsia: no Polyuria: no Weight change: no Visual  disturbance: no Glucose Monitoring: no    Accucheck frequency: TID    Fasting glucose:     Post prandial:  Diabetic Education: Not Completed Family history of diabetes: grandmother, mother, and nephew   HYPOTHYROIDISM Taking Levothyroxine  125 MCG daily. Pharmacy is getting different brand in because recent was recalled. Continues Omeprazole  for GERD with benefit. Thyroid  control status:controlled Satisfied with current treatment? yes Medication side effects: no Medication compliance: excellent compliance Etiology of hypothyroidism: unknown Recent dose adjustment:no Fatigue: yes Cold intolerance: no Heat intolerance: no Weight gain: no Weight loss: no Constipation: yes, occasional Diarrhea/loose stools: no Palpitations: no Lower extremity edema: no Anxiety/depressed mood: no   DEPRESSION Taking on Sertraline  100 MG daily. Mood status: stable Satisfied with current treatment?: yes Symptom severity: mild  Duration of current treatment : years Side effects: no Medication compliance: excellent compliance Psychotherapy/counseling: yes, in the past. Depressed mood: no Anxious mood: yes due to health Anhedonia: no Significant weight loss or gain: no Insomnia: no  Fatigue: yes Feelings of worthlessness or guilt: no Impaired concentration/indecisiveness: no Suicidal ideations: no Hopelessness: no Crying spells: no    08/30/2023    8:18 AM 05/18/2023    2:44 PM 02/24/2023    8:10 AM 08/23/2022    8:14 AM 02/19/2022    8:24 AM  Depression screen PHQ 2/9  Decreased Interest 0 0 0 0 0  Down, Depressed, Hopeless 1 0 0 0 0  PHQ - 2 Score 1 0 0 0 0  Altered sleeping 0  1 1 0  Tired, decreased energy 1  1 0 0  Change in appetite 0  1 1 1   Feeling bad or failure about yourself  0  0 0 0  Trouble concentrating 0  0 0 0  Moving slowly or fidgety/restless 0  0 0 0  Suicidal thoughts 0  0 0 0  PHQ-9 Score 2  3 2 1   Difficult doing work/chores Not difficult at all  Not difficult at  all  Not difficult at all       08/30/2023    8:18 AM 02/24/2023    8:10 AM 08/23/2022    8:14 AM 02/19/2022    8:24 AM  GAD 7 : Generalized Anxiety Score  Nervous, Anxious, on Edge 0 0 0 0  Control/stop worrying 0 1 0 1  Worry too much - different things 1 1 0 1  Trouble relaxing 0 0 0 0  Restless 0 0 0 0  Easily annoyed or irritable 0 1 0 0  Afraid - awful might happen 0 0 0 0  Total GAD 7 Score 1 3 0 2  Anxiety Difficulty Not difficult at all Not difficult at all Not difficult at all Not difficult at all       08/20/2021    8:31 AM 02/19/2022    8:14 AM 08/23/2022    8:13 AM 02/24/2023    8:10 AM 08/30/2023    8:18 AM  Fall Risk  Falls in the past year? 0 0 0 0 0  Number of falls in past year - Comments Simultaneous filing. User may not have seen previous data.      Was there an injury with Fall? 0 0 0 0 0  Was there an injury with Fall? - Comments Simultaneous filing. User may not have seen previous data.      Fall Risk Category Calculator 0 0 0 0 0  Fall Risk Category Calculator - Comments Simultaneous filing. User may not have seen previous data.      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 User may not have seen previous data.       (RETIRED) Patient Fall Risk Level Low fall risk       (RETIRED) Patient Fall Risk Level - Comments Simultaneous filing. User may not have seen previous data.       Patient at Risk for Falls Due to No Fall Risks No Fall Risks No Fall Risks No Fall Risks No Fall Risks  Patient at Risk for Falls Due to - Comments Simultaneous filing. User may not have seen previous data.      Fall risk Follow up Falls prevention discussed  Falls evaluation completed  Falls evaluation completed Falls evaluation completed Falls evaluation completed  Fall risk Follow up - Comments Simultaneous filing. User may not have seen previous data.          Data saved with a previous flowsheet row definition     Functional Status Survey: Is the patient deaf or have difficulty hearing?: No Does the patient have difficulty seeing, even when wearing glasses/contacts?: No Does the patient have difficulty concentrating, remembering, or making decisions?: No Does the patient have difficulty walking or climbing stairs?: No Does the patient have difficulty dressing or bathing?: No Does the patient  have difficulty doing errands alone such as visiting a doctor's office or shopping?: No    Past Medical History:  Past Medical History:  Diagnosis Date   Allergy    Anemia    YEARS AGO WHILE HAVING PERIODS   Anxiety    Arthritis    DCIS (ductal carcinoma in situ) of breast    Dysplastic nevus 06/15/2006   epigastric area - moderate   Gastric polyp    GERD (gastroesophageal reflux disease)    HSV (herpes simplex virus) infection    Hyperlipidemia    Hypertension    H/O LOST WEIGHT AND NO MEDS X 6 YRS    Hypothyroidism    Obesity    OSA on CPAP    PMDD (premenstrual dysphoric disorder)    Pre-diabetes    PRIOR TO GASTRIC SLEEVE   Sleep apnea 2016   9 years ago   Thyroid  disease    Vitamin D  deficiency     Surgical History:  Past Surgical History:  Procedure Laterality Date   BLADDER SURGERY Right 05/09/2023   rt breast stereo calcs asy x clip path pending   BREAST BIOPSY     BREAST BIOPSY Right 05/09/2023   MM RT BREAST BX W LOC DEV 1ST LESION IMAGE BX SPEC STEREO GUIDE 05/09/2023 [facility]   BREAST BIOPSY Right 05/25/2023   MM RT RADIO FREQUENCY TAG LOC MAMMO GUIDE 05/25/2023 [facility]   BREAST BIOPSY Right 05/25/2023   MM RT RADIO FREQUENCY TAG EA ADD LESION LOC MAMMO GUIDE 05/25/2023 [facility]   BREAST LUMPECTOMY WITH RADIO FREQUENCY LOCALIZER Right 06/01/2023   Procedure: BREAST LUMPECTOMY WITH RADIO FREQUENCY LOCALIZER;  Surgeon: Lane Shope, MD;  Location: [facility];  Service: General;  Laterality: Right;   CHOLECYSTECTOMY     KNEE ARTHROSCOPY Left    KNEE  ARTHROSCOPY Left 04/09/2020   Procedure: ARTHROSCOPY KNEE;  Surgeon: Mardee Lynwood SQUIBB, MD;  Location: [facility];  Service: Orthopedics;  Laterality: Left;   LAPAROSCOPIC GASTRIC SLEEVE RESECTION     parotid gland removal      Medications:  Current Outpatient Medications on File Prior to Visit  Medication Sig   aspirin EC 81 MG tablet Take 81 mg by mouth daily.    Azelastine HCl 137 MCG/SPRAY SOLN Place 2 sprays into both nostrils daily.   Cholecalciferol (VITAMIN D ) 50 MCG (2000 UT) tablet Take 2,000 Units by mouth daily.   diphenhydramine-acetaminophen  (TYLENOL  PM) 25-500 MG TABS tablet Take 2 tablets by mouth at bedtime as needed (sleep).   fluticasone (FLONASE) 50 MCG/ACT nasal spray Place 2 sprays into both nostrils daily as needed for allergies or rhinitis.   letrozole  (FEMARA ) 2.5 MG tablet Take 1 tablet (2.5 mg total) by mouth daily.   levothyroxine  (SYNTHROID ) 125 MCG tablet TAKE 1 TABLET BY MOUTH EVERY DAY   Multiple Vitamin (MULTIVITAMIN WITH MINERALS) TABS tablet Take 1 tablet by mouth daily.   NON FORMULARY Pt uses a cpap nightly   No current facility-administered medications on file prior to visit.    Allergies:  Allergies  Allergen Reactions   Lisinopril Cough    Social History:  Social History   Socioeconomic History   Marital status: Single    Spouse name: Not on file   Number of children: Not on file   Years of education: Not on file   Highest education level: Never attended school  Occupational History   Occupation: [employer]  Tobacco Use   Smoking status: Former    Current packs/day:  0.00    Average packs/day: 0.3 packs/day for 3.0 years (0.8 ttl pk-yrs)    Types: Cigarettes    Start date: 03/31/1997    Quit date: 03/31/2000    Years since quitting: 23.4   Smokeless tobacco: Former  Building services engineer status: Never Used  Substance and Sexual Activity   Alcohol use: Yes    Alcohol/week: 2.0 standard drinks of alcohol    Types: 1  Cans of beer, 1 Standard drinks or equivalent per week    Comment: May I have a drink one time a week if that p   Drug use: No   Sexual activity: Not Currently    Birth control/protection: Condom  Other Topics Concern   Not on file  Social History Narrative   Not on file   Social Drivers of Health   Financial Resource Strain: Low Risk  (08/27/2023)   Overall Financial Resource Strain (CARDIA)    Difficulty of Paying Living Expenses: Not hard at all  Food Insecurity: No Food Insecurity (08/27/2023)   Hunger Vital Sign    Worried About Running Out of Food in the Last Year: Never true    Ran Out of Food in the Last Year: Never true  Transportation Needs: No Transportation Needs (08/27/2023)   PRAPARE - Administrator, Civil Service (Medical): No    Lack of Transportation (Non-Medical): No  Physical Activity: Inactive (08/27/2023)   Exercise Vital Sign    Days of Exercise per Week: 1 day    Minutes of Exercise per Session: 0 min  Stress: No Stress Concern Present (08/27/2023)   [employer] of Occupational Health - Occupational Stress Questionnaire    Feeling of Stress: Not at all  Social Connections: Moderately Isolated (08/27/2023)   Social Connection and Isolation Panel    Frequency of Communication with Friends and Family: More than three times a week    Frequency of Social Gatherings with Friends and Family: Once a week    Attends Religious Services: 1 to 4 times per year    Active Member of [employer] or Organizations: No    Attends Engineer, structural: Not on file    Marital Status: Never married  Intimate Partner Violence: Not At Risk (08/30/2023)   Humiliation, Afraid, Rape, and Kick questionnaire    Fear of Current or Ex-Partner: No    Emotionally Abused: No    Physically Abused: No    Sexually Abused: No   Social History   Tobacco Use  Smoking Status Former   Current packs/day: 0.00   Average packs/day: 0.3 packs/day for 3.0 years (0.8 ttl pk-yrs)    Types: Cigarettes   Start date: 03/31/1997   Quit date: 03/31/2000   Years since quitting: 23.4  Smokeless Tobacco Former   Social History   Substance and Sexual Activity  Alcohol Use Yes   Alcohol/week: 2.0 standard drinks of alcohol   Types: 1 Cans of beer, 1 Standard drinks or equivalent per week   Comment: May I have a drink one time a week if that p    Family History:  Family History  Problem Relation Age of Onset   Diabetes Mother    Hepatitis C Mother    Sleep apnea Father    Arthritis Sister    Bone cancer Paternal Uncle    Throat cancer Paternal Uncle    Diabetes Maternal Grandmother    Cancer Maternal Grandfather    Breast cancer Neg Hx  Past medical history, surgical history, medications, allergies, family history and social history reviewed with patient today and changes made to appropriate areas of the chart.   ROS All other ROS negative except what is listed above and in the HPI.      Objective:    BP 124/82 (BP Location: Left Arm, Patient Position: Sitting, Cuff Size: Large)   Pulse 71   Temp 98.5 F (36.9 C) (Oral)   Ht 5' 6 (1.676 m)   Wt 235 lb 12.8 oz (107 kg)   LMP  (LMP Unknown)   SpO2 97%   BMI 38.06 kg/m   Wt Readings from Last 3 Encounters:  08/30/23 235 lb 12.8 oz (107 kg)  06/16/23 228 lb (103.4 kg)  06/15/23 231 lb 1.6 oz (104.8 kg)    Physical Exam Vitals and nursing note reviewed. Exam conducted with a chaperone present.  Constitutional:      General: She is awake. She is not in acute distress.    Appearance: She is well-developed and well-groomed. She is obese. She is not ill-appearing or toxic-appearing.  HENT:     Head: Normocephalic and atraumatic.     Right Ear: Hearing, ear canal and external ear normal. No drainage. A middle ear effusion is present. There is no impacted cerumen. Tympanic membrane is not injected.     Left Ear: Hearing, ear canal and external ear normal. No drainage. A middle ear effusion is present. There  is no impacted cerumen. Tympanic membrane is not injected.     Nose: Nose normal.     Right Sinus: No maxillary sinus tenderness or frontal sinus tenderness.     Left Sinus: No maxillary sinus tenderness or frontal sinus tenderness.     Mouth/Throat:     Mouth: Mucous membranes are moist.     Pharynx: Oropharynx is clear. Uvula midline. Posterior oropharyngeal erythema (mild cobblestone appearance) present. No pharyngeal swelling or oropharyngeal exudate.     Comments: Mild hoarseness noted. Eyes:     General: Lids are normal.        Right eye: No discharge.        Left eye: No discharge.     Extraocular Movements: Extraocular movements intact.     Conjunctiva/sclera: Conjunctivae normal.     Pupils: Pupils are equal, round, and reactive to light.     Visual Fields: Right eye visual fields normal and left eye visual fields normal.  Neck:     Thyroid : No thyromegaly.     Vascular: No carotid bruit.     Trachea: Trachea normal.  Cardiovascular:     Rate and Rhythm: Normal rate and regular rhythm.     Heart sounds: Normal heart sounds. No murmur heard.    No gallop.  Pulmonary:     Effort: Pulmonary effort is normal. No accessory muscle usage or respiratory distress.     Breath sounds: Normal breath sounds.  Chest:  Breasts:    Right: Normal.     Left: Normal.  Abdominal:     General: Bowel sounds are normal.     Palpations: Abdomen is soft. There is no hepatomegaly or splenomegaly.     Tenderness: There is no abdominal tenderness.  Musculoskeletal:        General: Normal range of motion.     Cervical back: Normal range of motion and neck supple.     Right lower leg: No edema.     Left lower leg: No edema.  Lymphadenopathy:     Head:  Right side of head: No submental, submandibular, tonsillar, preauricular or posterior auricular adenopathy.     Left side of head: No submental, submandibular, tonsillar, preauricular or posterior auricular adenopathy.     Cervical: No  cervical adenopathy.     Upper Body:     Right upper body: No supraclavicular, axillary or pectoral adenopathy.     Left upper body: No supraclavicular, axillary or pectoral adenopathy.  Skin:    General: Skin is warm and dry.     Capillary Refill: Capillary refill takes less than 2 seconds.     Findings: No rash.  Neurological:     Mental Status: She is alert and oriented to person, place, and time.     Gait: Gait is intact.     Deep Tendon Reflexes: Reflexes are normal and symmetric.     Reflex Scores:      Brachioradialis reflexes are 2+ on the right side and 2+ on the left side.      Patellar reflexes are 2+ on the right side and 2+ on the left side. Psychiatric:        Attention and Perception: Attention normal.        Mood and Affect: Mood normal.        Speech: Speech normal.        Behavior: Behavior normal. Behavior is cooperative.        Thought Content: Thought content normal.        Judgment: Judgment normal.    Results for orders placed or performed in visit on 07/27/23  Rad Onc [facility] Session Summary   Collection Time: 07/27/23  2:53 PM  Result Value Ref Range   Course ID [id number]    Course Intent Curative    Course Start Date 06/20/2023    Session Number 21    Course First Treatment Date 06/28/2023  3:19 PM    Course Last Treatment Date 07/27/2023  2:53 PM    Course Elapsed Days 29    Reference Point ID R Brst Bst dp    Reference Point Dosage Given to Date 10 Gy   Reference Point Session Dosage Given 2 Gy   Plan ID Breast_R_Bst    Plan Fractions Treated to Date 5    Plan Total Fractions Prescribed 5    Plan Prescribed Dose Per Fraction 2 Gy   Plan Total Prescribed Dose 10.000000 Gy   Plan Primary Reference Point R Brst Bst dp       Assessment & Plan:   Problem List Items Addressed This Visit       Respiratory   Obstructive sleep apnea of adult   Chronic, ongoing.  Diagnosed with severe OSA in May 2023.  Continue to use BiPAP nightly, she uses 100% of the  time per report.      Allergic rhinitis   Chronic, ongoing with worsening hoarseness this year  Continue current medication regimen and adjust as needed based on symptoms.  Recommend she add on Zyrtec daily with her nasal spray.  If no benefit from both nasal spray and oral medication, then will consider referral to ENT.  Discussed with her today.        Digestive   GERD (gastroesophageal reflux disease)   Chronic, ongoing.  Continue Prilosec daily and recommend trial of a slow reduction off, if symptoms return then restart.  Check Mag level annually. Risks of PPI use were discussed with patient including bone loss, C. Diff diarrhea, pneumonia, infections, CKD, electrolyte abnormalities.  Verbalizes understanding  and chooses to continue the medication.       Relevant Medications   omeprazole  (PRILOSEC) 20 MG capsule   Other Relevant Orders   Magnesium     Endocrine   IFG (impaired fasting glucose)   Ongoing.  Recent A1c 6.4%, slight trend up.  Recommend heavy focus on diet and regular exercise. Check A1c today.  Start medications as needed.      Relevant Orders   Bayer DCA Hb A1c Waived   Adult hypothyroidism   Chronic, ongoing.  Will continue Levothyroxine  125 MCG and adjust further as needed.  Labs today.      Relevant Orders   TSH   T4, free     Other   Vitamin D  deficiency   Ongoing, continue supplement.  Plan on DEXA due to cancer and recent treatment, ordered by oncology and provided her number to call and schedule. Labs today.      Relevant Orders   VITAMIN D  25 Hydroxy (Vit-D Deficiency, Fractures)   HLD (hyperlipidemia)   Chronic, ongoing, tolerating Atorvastatin  at 40 MG daily.  Continue regimen and adjust as needed. Lipid panel today.      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  Complete radiation therapy on 07/27/23.  Continue to collaborate with oncology, recent notes reviewed.  Provided her number to call and schedule bone density.      Relevant Orders   CBC with Differential/Platelet   Comprehensive metabolic panel with GFR   Anxiety and depression   Chronic, stable.  Denies SI/HI.  Continue current medication regimen and adjust as needed. Refills sent in.       Relevant Medications   sertraline  (ZOLOFT ) 100 MG tablet   Other Visit Diagnoses       Encounter for annual physical exam       Annual physical today with labs and health maintenance reviewed, discussed with patient.        Follow up plan: Return in about 6 months (around 03/01/2024) for OSA, HLD, MOOD, CANCER.   LABORATORY TESTING:  - Pap smear: up to date  IMMUNIZATIONS:   - Tdap: Tetanus vaccination status reviewed: last tetanus booster within 10 years. - Influenza: Up to date - Pneumovax: Not applicable - Prevnar: Refuses today - COVID: Up to date - HPV: Not applicable - Shingrix  vaccine: Up To Date  SCREENING: -Mammogram: Up to date  - Colonoscopy: Up to date -- is scheduled for August 2025 - Bone Density:Ordered today, by oncology -Hearing Test: Not applicable  -Spirometry: Not applicable   PATIENT COUNSELING:   Advised to take 1 mg of folate supplement per day if capable of pregnancy.   Sexuality: Discussed sexually transmitted diseases, partner selection, use of condoms, avoidance of unintended pregnancy  and contraceptive alternatives.   Advised to avoid cigarette smoking.  I discussed with the patient that most people either abstain from alcohol or drink within safe limits (<=14/week and <=4 drinks/occasion for males, <=7/weeks and <= 3 drinks/occasion for females) and that the risk for alcohol disorders and other health effects rises proportionally with the number of drinks per week and how often a drinker exceeds daily limits.  Discussed  cessation/primary prevention of drug use and availability of treatment for abuse.   Diet: Encouraged to adjust caloric intake  to maintain  or achieve ideal body weight, to reduce intake of dietary saturated fat and total fat, to limit sodium intake by avoiding high sodium foods and not adding table salt, and to maintain adequate dietary potassium and calcium  preferably from fresh fruits, vegetables, and low-fat dairy products.    Stressed the importance of regular exercise  Injury prevention: Discussed safety belts, safety helmets, smoke detector, smoking near bedding or upholstery.   Dental health: Discussed importance of regular tooth brushing, flossing, and dental visits.    NEXT PREVENTATIVE PHYSICAL DUE IN 1 YEAR. Return in about 6 months (around 03/01/2024) for OSA, HLD, MOOD, CANCER.

## 2023-08-30 NOTE — Assessment & Plan Note (Signed)
 Chronic, ongoing.  Will continue Levothyroxine  125 MCG and adjust further as needed.  Labs today.

## 2023-08-30 NOTE — Progress Notes (Signed)
 Contacted via MyChart  Still in Prediabetic range and did trend down from 6.4% to 6.3%.  Goods news.  I suspect diet changes will make a big difference.:)

## 2023-08-30 NOTE — Assessment & Plan Note (Signed)
 Chronic, ongoing, tolerating Atorvastatin at 40 MG daily.  Continue regimen and adjust as needed. Lipid panel today.

## 2023-08-30 NOTE — Assessment & Plan Note (Signed)
 Chronic, ongoing.  Continue Prilosec daily and recommend trial of a slow reduction off, if symptoms return then restart.  Check Mag level annually. Risks of PPI use were discussed with patient including bone loss, C. Diff diarrhea, pneumonia, infections, CKD, electrolyte abnormalities.  Verbalizes understanding and chooses to continue the medication.

## 2023-08-30 NOTE — Assessment & Plan Note (Signed)
 Ongoing, taking Femara .  Complete radiation therapy on 07/27/23.  Continue to collaborate with oncology, recent notes reviewed.  Provided her number to call and schedule bone density.

## 2023-08-30 NOTE — Assessment & Plan Note (Signed)
 Ongoing.  Recent A1c 6.4%, slight trend up.  Recommend heavy focus on diet and regular exercise. Check A1c today.  Start medications as needed.

## 2023-08-30 NOTE — Assessment & Plan Note (Signed)
 Chronic, ongoing with worsening hoarseness this year  Continue current medication regimen and adjust as needed based on symptoms.  Recommend she add on Zyrtec daily with her nasal spray.  If no benefit from both nasal spray and oral medication, then will consider referral to ENT.  Discussed with her today.

## 2023-08-30 NOTE — Assessment & Plan Note (Signed)
 Ongoing, continue supplement.  Plan on DEXA due to cancer and recent treatment, ordered by oncology and provided her number to call and schedule. Labs today.

## 2023-08-30 NOTE — Assessment & Plan Note (Signed)
Chronic, ongoing.  Diagnosed with severe OSA in May 2023.  Continue to use BiPAP nightly, she uses 100% of the time per report.

## 2023-08-30 NOTE — Assessment & Plan Note (Signed)
Chronic, stable.  Denies SI/HI.  Continue current medication regimen and adjust as needed.  Refills sent in. 

## 2023-08-31 LAB — T4, FREE: Free T4: 1.31 ng/dL (ref 0.82–1.77)

## 2023-08-31 LAB — CBC WITH DIFFERENTIAL/PLATELET
Basophils Absolute: 0 x10E3/uL (ref 0.0–0.2)
Basos: 1 %
EOS (ABSOLUTE): 0.1 x10E3/uL (ref 0.0–0.4)
Eos: 3 %
Hematocrit: 37.8 % (ref 34.0–46.6)
Hemoglobin: 11.4 g/dL (ref 11.1–15.9)
Immature Grans (Abs): 0 x10E3/uL (ref 0.0–0.1)
Immature Granulocytes: 0 %
Lymphocytes Absolute: 1 x10E3/uL (ref 0.7–3.1)
Lymphs: 24 %
MCH: 26.7 pg (ref 26.6–33.0)
MCHC: 30.2 g/dL — ABNORMAL LOW (ref 31.5–35.7)
MCV: 89 fL (ref 79–97)
Monocytes Absolute: 0.3 x10E3/uL (ref 0.1–0.9)
Monocytes: 7 %
Neutrophils Absolute: 2.7 x10E3/uL (ref 1.4–7.0)
Neutrophils: 65 %
Platelets: 268 x10E3/uL (ref 150–450)
RBC: 4.27 x10E6/uL (ref 3.77–5.28)
RDW: 13.7 % (ref 11.7–15.4)
WBC: 4.2 x10E3/uL (ref 3.4–10.8)

## 2023-08-31 LAB — LIPID PANEL W/O CHOL/HDL RATIO
Cholesterol, Total: 194 mg/dL (ref 100–199)
HDL: 81 mg/dL (ref >39)
LDL Chol Calc (NIH): 93 mg/dL (ref 0–99)
Triglycerides: 114 mg/dL (ref 0–149)
VLDL Cholesterol Cal: 20 mg/dL (ref 5–40)

## 2023-08-31 LAB — COMPREHENSIVE METABOLIC PANEL WITH GFR
ALT: 30 IU/L (ref 0–32)
AST: 28 IU/L (ref 0–40)
Albumin: 4.5 g/dL (ref 3.8–4.9)
Alkaline Phosphatase: 107 IU/L (ref 44–121)
BUN/Creatinine Ratio: 16 (ref 12–28)
BUN: 13 mg/dL (ref 8–27)
Bilirubin Total: 0.4 mg/dL (ref 0.0–1.2)
CO2: 23 mmol/L (ref 20–29)
Calcium: 9.6 mg/dL (ref 8.7–10.3)
Chloride: 102 mmol/L (ref 96–106)
Creatinine, Ser: 0.79 mg/dL (ref 0.57–1.00)
Globulin, Total: 2.6 g/dL (ref 1.5–4.5)
Glucose: 101 mg/dL — ABNORMAL HIGH (ref 70–99)
Potassium: 4.6 mmol/L (ref 3.5–5.2)
Sodium: 142 mmol/L (ref 134–144)
Total Protein: 7.1 g/dL (ref 6.0–8.5)
eGFR: 86 mL/min/1.73 (ref >59)

## 2023-08-31 LAB — MAGNESIUM: Magnesium: 2.4 mg/dL — ABNORMAL HIGH (ref 1.6–2.3)

## 2023-08-31 LAB — VITAMIN D 25 HYDROXY (VIT D DEFICIENCY, FRACTURES): Vit D, 25-Hydroxy: 39.9 ng/mL (ref 30.0–100.0)

## 2023-08-31 LAB — TSH: TSH: 3.69 u[IU]/mL (ref 0.450–4.500)

## 2023-09-15 ENCOUNTER — Other Ambulatory Visit: Payer: Self-pay | Admitting: Oncology

## 2023-09-29 ENCOUNTER — Encounter: Payer: Self-pay | Admitting: Radiation Oncology

## 2023-09-29 ENCOUNTER — Ambulatory Visit
Admission: RE | Admit: 2023-09-29 | Discharge: 2023-09-29 | Disposition: A | Discharge status: Home / Self Care | Source: Ambulatory Visit | Attending: Oncology | Admitting: Oncology

## 2023-09-29 VITALS — BP 126/73 | HR 71 | Temp 97.6°F | Resp 20 | Wt 233.5 lb

## 2023-09-29 DIAGNOSIS — D0511 Intraductal carcinoma in situ of right breast: Secondary | ICD-10-CM | POA: Diagnosis present

## 2023-09-29 NOTE — Progress Notes (Signed)
 Radiation Oncology Follow up Note  Name: Dominique Kerr [facility]   Date:   09/29/2023 MRN:  [id number] DOB: 11/22/1960    This 63 y.o. 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  REFERRING PROVIDER: Valerio Melanie DASEN, NP  HPI: Patient is a 63 year old female now at 1 month having completed whole breast radiation to her right breast for ER positive ductal carcinoma site 2.  Seen today in routine follow-up she is doing well.  She specifically denies breast tenderness cough or bone pain.  She has been started on letrozole  tolerating that well without side effect.  COMPLICATIONS OF TREATMENT: none  FOLLOW UP COMPLIANCE: keeps appointments   PHYSICAL EXAM:  BP 126/73   Pulse 71   Temp 97.6 F (36.4 C) (Oral)   Resp 20   Wt 233 lb 8 oz (105.9 kg)   LMP  (LMP Unknown)   SpO2 100%   BMI 37.69 kg/m  Lungs are clear to A&P cardiac examination essentially unremarkable with regular rate and rhythm. No dominant mass or nodularity is noted in either breast in 2 positions examined. Incision is well-healed. No axillary or supraclavicular adenopathy is appreciated. Cosmetic result is excellent.  Well-developed well-nourished patient in NAD. HEENT reveals PERLA, EOMI, discs not visualized.  Oral cavity is clear. No oral mucosal lesions are identified. Neck is clear without evidence of cervical or supraclavicular adenopathy. Lungs are clear to A&P. Cardiac examination is essentially unremarkable with regular rate and rhythm without murmur rub or thrill. Abdomen is benign with no organomegaly or masses noted. Motor sensory and DTR levels are equal and symmetric in the upper and lower extremities. Cranial nerves II through XII are grossly intact. Proprioception is intact. No peripheral adenopathy or edema is identified. No motor or sensory levels are noted. Crude visual fields are  within normal range.  RADIOLOGY RESULTS: No current films for review  PLAN: Present time patient is doing well with very low side effect profile 1 month out from whole breast radiation.  She currently is on letrozole  tolerating it well without side effect.  I have asked to see her back in 6 months for follow-up.  Patient knows to call with any concerns at any time.  I would like to take this opportunity to thank you for allowing me to participate in the care of your patient.SABRA Marcey Penton, MD

## 2023-09-30 ENCOUNTER — Inpatient Hospital Stay: Attending: Oncology

## 2023-09-30 ENCOUNTER — Inpatient Hospital Stay (HOSPITAL_BASED_OUTPATIENT_CLINIC_OR_DEPARTMENT_OTHER): Admitting: Oncology

## 2023-09-30 ENCOUNTER — Encounter: Payer: Self-pay | Admitting: Oncology

## 2023-09-30 VITALS — BP 134/67 | HR 75 | Temp 97.5°F | Resp 12 | Ht 66.0 in | Wt 233.5 lb

## 2023-09-30 DIAGNOSIS — R232 Flushing: Secondary | ICD-10-CM | POA: Insufficient documentation

## 2023-09-30 DIAGNOSIS — Z7982 Long term (current) use of aspirin: Secondary | ICD-10-CM | POA: Diagnosis not present

## 2023-09-30 DIAGNOSIS — Z833 Family history of diabetes mellitus: Secondary | ICD-10-CM | POA: Diagnosis not present

## 2023-09-30 DIAGNOSIS — Z825 Family history of asthma and other chronic lower respiratory diseases: Secondary | ICD-10-CM | POA: Diagnosis not present

## 2023-09-30 DIAGNOSIS — Z9049 Acquired absence of other specified parts of digestive tract: Secondary | ICD-10-CM | POA: Insufficient documentation

## 2023-09-30 DIAGNOSIS — F419 Anxiety disorder, unspecified: Secondary | ICD-10-CM | POA: Diagnosis not present

## 2023-09-30 DIAGNOSIS — Z923 Personal history of irradiation: Secondary | ICD-10-CM | POA: Insufficient documentation

## 2023-09-30 DIAGNOSIS — F32A Depression, unspecified: Secondary | ICD-10-CM | POA: Insufficient documentation

## 2023-09-30 DIAGNOSIS — E785 Hyperlipidemia, unspecified: Secondary | ICD-10-CM | POA: Insufficient documentation

## 2023-09-30 DIAGNOSIS — Z08 Encounter for follow-up examination after completed treatment for malignant neoplasm: Secondary | ICD-10-CM

## 2023-09-30 DIAGNOSIS — G4733 Obstructive sleep apnea (adult) (pediatric): Secondary | ICD-10-CM | POA: Insufficient documentation

## 2023-09-30 DIAGNOSIS — Z79811 Long term (current) use of aromatase inhibitors: Secondary | ICD-10-CM

## 2023-09-30 DIAGNOSIS — Z831 Family history of other infectious and parasitic diseases: Secondary | ICD-10-CM | POA: Insufficient documentation

## 2023-09-30 DIAGNOSIS — Z8261 Family history of arthritis: Secondary | ICD-10-CM | POA: Insufficient documentation

## 2023-09-30 DIAGNOSIS — E039 Hypothyroidism, unspecified: Secondary | ICD-10-CM | POA: Insufficient documentation

## 2023-09-30 DIAGNOSIS — Z808 Family history of malignant neoplasm of other organs or systems: Secondary | ICD-10-CM | POA: Diagnosis not present

## 2023-09-30 DIAGNOSIS — Z79899 Other long term (current) drug therapy: Secondary | ICD-10-CM | POA: Insufficient documentation

## 2023-09-30 DIAGNOSIS — Z888 Allergy status to other drugs, medicaments and biological substances status: Secondary | ICD-10-CM | POA: Diagnosis not present

## 2023-09-30 DIAGNOSIS — D0511 Intraductal carcinoma in situ of right breast: Secondary | ICD-10-CM | POA: Diagnosis present

## 2023-09-30 DIAGNOSIS — Z809 Family history of malignant neoplasm, unspecified: Secondary | ICD-10-CM | POA: Insufficient documentation

## 2023-09-30 DIAGNOSIS — Z87891 Personal history of nicotine dependence: Secondary | ICD-10-CM | POA: Diagnosis not present

## 2023-09-30 DIAGNOSIS — Z86018 Personal history of other benign neoplasm: Secondary | ICD-10-CM | POA: Diagnosis not present

## 2023-09-30 DIAGNOSIS — Z853 Personal history of malignant neoplasm of breast: Secondary | ICD-10-CM

## 2023-09-30 LAB — CMP (CANCER CENTER ONLY)
ALT: 28 U/L (ref 0–44)
AST: 25 U/L (ref 15–41)
Albumin: 3.9 g/dL (ref 3.5–5.0)
Alkaline Phosphatase: 86 U/L (ref 38–126)
Anion gap: 8 (ref 5–15)
BUN: 16 mg/dL (ref 6–20)
CO2: 26 mmol/L (ref 22–32)
Calcium: 9 mg/dL (ref 8.9–10.3)
Chloride: 105 mmol/L (ref 98–111)
Creatinine: 0.74 mg/dL (ref 0.44–1.00)
GFR, Estimated: >60 mL/min (ref >60)
Glucose, Bld: 110 mg/dL — ABNORMAL HIGH (ref 70–99)
Potassium: 3.7 mmol/L (ref 3.5–5.1)
Sodium: 139 mmol/L (ref 135–145)
Total Bilirubin: 0.4 mg/dL (ref 0.0–1.2)
Total Protein: 7.1 g/dL (ref 6.5–8.1)

## 2023-09-30 NOTE — Progress Notes (Signed)
 Hematology/Oncology Consult note [facility]  [phone] [phone]  Patient Care Team: Cannady, Jolene T, NP as PCP - General (Nurse Practitioner) Georgina [place] POUR, RN as Oncology Nurse Navigator Lenn Aran, MD as Consulting Physician (Radiation Oncology) Melanee Annah BROCKS, MD as Consulting Physician (Oncology)   Name of the patient: Dominique Kerr  [id number]  1960-10-27   Date of visit: 09/30/23  Diagnosis- right breast DCIS ER positive    Chief complaint/ Reason for visit-routine follow-up of right breast DCIS  Heme/Onc history: Patient is a 63 year old female diagnosed with 2.8 cm DCIS based on mammogram and biopsy in February 2025.  She underwentLumpectomy in April 2025 which showed 2.2 cm grade 2 DCIS with comedonecrosis.  Margins were close at 0.5 mm.  ER +90%.  Patient was using transdermal estrogen patch for hormone replacement therapy which she stopped after the diagnosis of DCIS   Patient completed adjuvant radiation therapy and started taking letrozole  in June 2025  Interval history-patient is tolerating letrozole  well.  She does get on and off hot flashes which is self-limited.  Denies other complaints at this time  ECOG PS- 0 Pain scale- 0   Review of systems- Review of Systems  Constitutional:  Negative for chills, fever, malaise/fatigue and weight loss.  HENT:  Negative for congestion, ear discharge and nosebleeds.   Eyes:  Negative for blurred vision.  Respiratory:  Negative for cough, hemoptysis, sputum production, shortness of breath and wheezing.   Cardiovascular:  Negative for chest pain, palpitations, orthopnea and claudication.  Gastrointestinal:  Negative for abdominal pain, blood in stool, constipation, diarrhea, heartburn, melena, nausea and vomiting.  Genitourinary:  Negative for dysuria, flank pain, frequency, hematuria and urgency.  Musculoskeletal:  Negative for back pain, joint pain and myalgias.   Skin:  Negative for rash.  Neurological:  Negative for dizziness, tingling, focal weakness, seizures, weakness and headaches.  Endo/Heme/Allergies:  Does not bruise/bleed easily.  Psychiatric/Behavioral:  Negative for depression and suicidal ideas. The patient does not have insomnia.       Allergies  Allergen Reactions   Lisinopril Cough     Past Medical History:  Diagnosis Date   Allergy    Anemia    YEARS AGO WHILE HAVING PERIODS   Anxiety    Arthritis    DCIS (ductal carcinoma in situ) of breast    Dysplastic nevus 06/15/2006   epigastric area - moderate   Gastric polyp    GERD (gastroesophageal reflux disease)    HSV (herpes simplex virus) infection    Hyperlipidemia    Hypertension    H/O LOST WEIGHT AND NO MEDS X 6 YRS    Hypothyroidism    Obesity    OSA on CPAP    PMDD (premenstrual dysphoric disorder)    Pre-diabetes    PRIOR TO GASTRIC SLEEVE   Sleep apnea 2016   9 years ago   Thyroid  disease    Vitamin D  deficiency      Past Surgical History:  Procedure Laterality Date   BLADDER SURGERY Right 05/09/2023   rt breast stereo calcs asy x clip path pending   BREAST BIOPSY     BREAST BIOPSY Right 05/09/2023   MM RT BREAST BX W LOC DEV 1ST LESION IMAGE BX SPEC STEREO GUIDE 05/09/2023 [facility]   BREAST BIOPSY Right 05/25/2023   MM RT RADIO FREQUENCY TAG LOC MAMMO GUIDE 05/25/2023 [facility]   BREAST BIOPSY Right 05/25/2023   MM RT RADIO FREQUENCY TAG EA ADD LESION  LOC MAMMO GUIDE 05/25/2023 [facility]   BREAST LUMPECTOMY WITH RADIO FREQUENCY LOCALIZER Right 06/01/2023   Procedure: BREAST LUMPECTOMY WITH RADIO FREQUENCY LOCALIZER;  Surgeon: Lane Shope, MD;  Location: [facility];  Service: General;  Laterality: Right;   CHOLECYSTECTOMY     KNEE ARTHROSCOPY Left    KNEE ARTHROSCOPY Left 04/09/2020   Procedure: ARTHROSCOPY KNEE;  Surgeon: Mardee Lynwood SQUIBB, MD;  Location: [facility];  Service: Orthopedics;  Laterality: Left;   LAPAROSCOPIC GASTRIC  SLEEVE RESECTION     parotid gland removal      Social History   Socioeconomic History   Marital status: Single    Spouse name: Not on file   Number of children: Not on file   Years of education: Not on file   Highest education level: Never attended school  Occupational History   Occupation: [employer]  Tobacco Use   Smoking status: Former    Current packs/day: 0.00    Average packs/day: 0.3 packs/day for 3.0 years (0.8 ttl pk-yrs)    Types: Cigarettes    Start date: 03/31/1997    Quit date: 03/31/2000    Years since quitting: 23.5   Smokeless tobacco: Former  Building services engineer status: Never Used  Substance and Sexual Activity   Alcohol use: Yes    Alcohol/week: 2.0 standard drinks of alcohol    Types: 1 Cans of beer, 1 Standard drinks or equivalent per week    Comment: May I have a drink one time a week if that p   Drug use: No   Sexual activity: Not Currently    Birth control/protection: Condom  Other Topics Concern   Not on file  Social History Narrative   Not on file   Social Drivers of Health   Financial Resource Strain: Low Risk  (08/27/2023)   Overall Financial Resource Strain (CARDIA)    Difficulty of Paying Living Expenses: Not hard at all  Food Insecurity: No Food Insecurity (08/27/2023)   Hunger Vital Sign    Worried About Running Out of Food in the Last Year: Never true    Ran Out of Food in the Last Year: Never true  Transportation Needs: No Transportation Needs (08/27/2023)   PRAPARE - Administrator, Civil Service (Medical): No    Lack of Transportation (Non-Medical): No  Physical Activity: Inactive (08/27/2023)   Exercise Vital Sign    Days of Exercise per Week: 1 day    Minutes of Exercise per Session: 0 min  Stress: No Stress Concern Present (08/27/2023)   [employer] of Occupational Health - Occupational Stress Questionnaire    Feeling of Stress: Not at all  Social Connections: Moderately Isolated (08/27/2023)    Social Connection and Isolation Panel    Frequency of Communication with Friends and Family: More than three times a week    Frequency of Social Gatherings with Friends and Family: Once a week    Attends Religious Services: 1 to 4 times per year    Active Member of [employer] or Organizations: No    Attends Engineer, structural: Not on file    Marital Status: Never married  Intimate Partner Violence: Not At Risk (08/30/2023)   Humiliation, Afraid, Rape, and Kick questionnaire    Fear of Current or Ex-Partner: No    Emotionally Abused: No    Physically Abused: No    Sexually Abused: No    Family History  Problem Relation Age of Onset   Diabetes Mother  Hepatitis C Mother    Sleep apnea Father    Arthritis Sister    Bone cancer Paternal Uncle    Throat cancer Paternal Uncle    Diabetes Maternal Grandmother    Cancer Maternal Grandfather    Breast cancer Neg Hx      Current Outpatient Medications:    aspirin EC 81 MG tablet, Take 81 mg by mouth daily. , Disp: , Rfl:    atorvastatin  (LIPITOR) 40 MG tablet, Take 1 tablet (40 mg total) by mouth daily., Disp: 90 tablet, Rfl: 4   Azelastine HCl 137 MCG/SPRAY SOLN, Place 2 sprays into both nostrils daily., Disp: , Rfl:    celecoxib  (CELEBREX ) 200 MG capsule, Take 1 capsule (200 mg total) by mouth daily as needed for moderate pain (pain score 4-6)., Disp: 30 capsule, Rfl: 12   Cholecalciferol (VITAMIN D ) 50 MCG (2000 UT) tablet, Take 2,000 Units by mouth daily., Disp: , Rfl:    diphenhydramine-acetaminophen  (TYLENOL  PM) 25-500 MG TABS tablet, Take 2 tablets by mouth at bedtime as needed (sleep)., Disp: , Rfl:    letrozole  (FEMARA ) 2.5 MG tablet, TAKE 1 TABLET BY MOUTH EVERY DAY, Disp: 90 tablet, Rfl: 1   levothyroxine  (SYNTHROID ) 125 MCG tablet, TAKE 1 TABLET BY MOUTH EVERY DAY, Disp: 90 tablet, Rfl: 3   Multiple Vitamin (MULTIVITAMIN WITH MINERALS) TABS tablet, Take 1 tablet by mouth daily., Disp: , Rfl:    NON FORMULARY, Pt uses a  cpap nightly, Disp: , Rfl:    omeprazole  (PRILOSEC) 20 MG capsule, TAKE 1 CAPSULE BY MOUTH EVERY DAY, Disp: 90 capsule, Rfl: 4   sertraline  (ZOLOFT ) 100 MG tablet, Take 1 tablet (100 mg total) by mouth daily., Disp: 90 tablet, Rfl: 4  Physical exam:  Vitals:   09/30/23 1342 09/30/23 1356  BP: (!) 149/71 134/67  Pulse: 75   Resp: 12   Temp: (!) 97.5 F (36.4 C)   TempSrc: Tympanic   SpO2: 97%   Weight: 233 lb 8 oz (105.9 kg)   Height: 5' 6 (1.676 m)    Physical Exam Cardiovascular:     Rate and Rhythm: Normal rate and regular rhythm.     Heart sounds: Normal heart sounds.  Pulmonary:     Effort: Pulmonary effort is normal.     Breath sounds: Normal breath sounds.  Skin:    General: Skin is warm and dry.  Neurological:     Mental Status: She is alert and oriented to person, place, and time.      I have personally reviewed labs listed below:    Latest Ref Rng & Units 09/30/2023    1:43 PM  CMP  Glucose 70 - 99 mg/dL 889   BUN 6 - 20 mg/dL 16   Creatinine 9.55 - 1.00 mg/dL 9.25   Sodium 864 - 854 mmol/L 139   Potassium 3.5 - 5.1 mmol/L 3.7   Chloride 98 - 111 mmol/L 105   CO2 22 - 32 mmol/L 26   Calcium  8.9 - 10.3 mg/dL 9.0   Total Protein 6.5 - 8.1 g/dL 7.1   Total Bilirubin 0.0 - 1.2 mg/dL 0.4   Alkaline Phos 38 - 126 U/L 86   AST 15 - 41 U/L 25   ALT 0 - 44 U/L 28       Latest Ref Rng & Units 08/30/2023    8:48 AM  CBC  WBC 3.4 - 10.8 x10E3/uL 4.2   Hemoglobin 11.1 - 15.9 g/dL 88.5   Hematocrit 65.9 -  46.6 % 37.8   Platelets 150 - 450 x10E3/uL 268      Assessment and plan- Patient is a 63 y.o. female with history of right breast DCIS ER positive s/p lumpectomy.  She is presently on letrozole  and this is a routine follow-up visit  Patient is tolerating letrozole  well without any significant side effects.  She will continue to take this for 5 years.  She has also completed adjuvant radiation therapy.  I will see her back in 4 months no labs.  I will obtain a  baseline bone density scan prior.  With regards to management of hot flashes: As long as they are not severe and not affecting her quality of life we will continue watchful monitoring at this time.  Nonhormonal medications such as gabapentin  and paroxetine are options for her should they become unbearable   Visit Diagnosis 1. High risk medication use   2. Encounter for follow-up surveillance of breast cancer   3. Prophylactic use of letrozole       Dr. Annah Skene, MD, MPH [facility] [phone] 09/30/2023 3:32 PM

## 2023-09-30 NOTE — Progress Notes (Signed)
 No concerns today

## 2023-10-05 ENCOUNTER — Ambulatory Visit (INDEPENDENT_AMBULATORY_CARE_PROVIDER_SITE_OTHER): Admitting: Dermatology

## 2023-10-05 ENCOUNTER — Encounter: Payer: Self-pay | Admitting: Dermatology

## 2023-10-05 DIAGNOSIS — D045 Carcinoma in situ of skin of trunk: Secondary | ICD-10-CM | POA: Diagnosis not present

## 2023-10-05 DIAGNOSIS — D492 Neoplasm of unspecified behavior of bone, soft tissue, and skin: Secondary | ICD-10-CM

## 2023-10-05 DIAGNOSIS — D099 Carcinoma in situ, unspecified: Secondary | ICD-10-CM

## 2023-10-05 HISTORY — DX: Carcinoma in situ, unspecified: D09.9

## 2023-10-05 NOTE — Progress Notes (Unsigned)
   Follow-Up Visit   Subjective  Dominique Kerr is a 63 y.o. female who presents for the following: Spot on abdomen. Dur: 3 weeks. Red. Non tender, denies bleeding.   The patient has spots, moles and lesions to be evaluated, some may be new or changing and the patient may have concern these could be cancer.  The following portions of the chart were reviewed this encounter and updated as appropriate: medications, allergies, medical history  Review of Systems:  No other skin or systemic complaints except as noted in HPI or Assessment and Plan.  Objective  Well appearing patient in no apparent distress; mood and affect are within normal limits.  A focused examination was performed of the following areas: Abdomen  Relevant physical exam findings are noted in the Assessment and Plan.  Right Abdomen (side) - Lower 1.2 cm pink patch   Assessment & Plan   NEOPLASM OF SKIN Right Abdomen (side) - Lower Epidermal / dermal shaving  Lesion diameter (cm):  1.2 Informed consent: discussed and consent obtained   Timeout: patient name, date of birth, surgical site, and procedure verified   Procedure prep:  Patient was prepped and draped in usual sterile fashion Prep type:  Isopropyl alcohol Anesthesia: the lesion was anesthetized in a standard fashion   Anesthetic:  1% lidocaine  w/ epinephrine  1-100,000 buffered w/ 8.4% NaHCO3 Instrument used: flexible razor blade   Hemostasis achieved with: pressure, aluminum chloride and electrodesiccation   Outcome: patient tolerated procedure well   Post-procedure details: sterile dressing applied and wound care instructions given   Dressing type: bandage and petrolatum    Destruction of lesion Complexity: extensive   Destruction method: electrodesiccation and curettage   Informed consent: discussed and consent obtained   Timeout:  patient name, date of birth, surgical site, and procedure verified Procedure prep:  Patient was prepped and  draped in usual sterile fashion Prep type:  Isopropyl alcohol Anesthesia: the lesion was anesthetized in a standard fashion   Anesthetic:  1% lidocaine  w/ epinephrine  1-100,000 buffered w/ 8.4% NaHCO3 Curettage performed in three different directions: Yes   Electrodesiccation performed over the curetted area: Yes   Curettage cycles:  3 Lesion length (cm):  1.2 Lesion width (cm):  1.2 Margin per side (cm):  0.1 Final wound size (cm):  1.4 Hemostasis achieved with:  pressure and aluminum chloride Outcome: patient tolerated procedure well with no complications   Post-procedure details: sterile dressing applied and wound care instructions given   Dressing type: bandage and petrolatum    Specimen 1 - Surgical pathology Differential Diagnosis: ISK, R/O BCC  Check Margins: No  EDC today   Return for TBSE, HxDN in 6-8 months.  I, Jill Parcell, CMA, am acting as scribe for Alm Rhyme, MD.   Documentation: I have reviewed the above documentation for accuracy and completeness, and I agree with the above.  Alm Rhyme, MD

## 2023-10-05 NOTE — Patient Instructions (Signed)
 Wound Care Instructions  Cleanse wound gently with soap and water  once a day then pat dry with clean gauze. Apply a thin coat of Petrolatum (petroleum jelly, Vaseline) over the wound (unless you have an allergy to this). We recommend that you use a new, sterile tube of Vaseline. Do not pick or remove scabs. Do not remove the yellow or white healing tissue from the base of the wound.  Cover the wound with fresh, clean, nonstick gauze and secure with paper tape. You may use Band-Aids in place of gauze and tape if the wound is small enough, but would recommend trimming much of the tape off as there is often too much. Sometimes Band-Aids can irritate the skin.  You should call the office for your biopsy report after 1 week if you have not already been contacted.  If you experience any problems, such as abnormal amounts of bleeding, swelling, significant bruising, significant pain, or evidence of infection, please call the office immediately.  FOR ADULT SURGERY PATIENTS: If you need something for pain relief you may take 1 extra strength Tylenol  (acetaminophen ) AND 2 Ibuprofen (200mg  each) together every 4 hours as needed for pain. (do not take these if you are allergic to them or if you have a reason you should not take them.) Typically, you may only need pain medication for 1 to 3 days.      Recommend daily broad spectrum sunscreen SPF 30+ to sun-exposed areas, reapply every 2 hours as needed. Call for new or changing lesions.  Staying in the shade or wearing long sleeves, sun glasses (UVA+UVB protection) and wide brim hats (4-inch brim around the entire circumference of the hat) are also recommended for sun protection.     Melanoma ABCDEs  Melanoma is the most dangerous type of skin cancer, and is the leading cause of death from skin disease.  You are more likely to develop melanoma if you: Have light-colored skin, light-colored eyes, or red or blond hair Spend a lot of time in the sun Tan  regularly, either outdoors or in a tanning bed Have had blistering sunburns, especially during childhood Have a close family member who has had a melanoma Have atypical moles or large birthmarks  Early detection of melanoma is key since treatment is typically straightforward and cure rates are extremely high if we catch it early.   The first sign of melanoma is often a change in a mole or a new dark spot.  The ABCDE system is a way of remembering the signs of melanoma.  A for asymmetry:  The two halves do not match. B for border:  The edges of the growth are irregular. C for color:  A mixture of colors are present instead of an even brown color. D for diameter:  Melanomas are usually (but not always) greater than 6mm - the size of a pencil eraser. E for evolution:  The spot keeps changing in size, shape, and color.  Please check your skin once per month between visits. You can use a small mirror in front and a large mirror behind you to keep an eye on the back side or your body.   If you see any new or changing lesions before your next follow-up, please call to schedule a visit.  Please continue daily skin protection including broad spectrum sunscreen SPF 30+ to sun-exposed areas, reapplying every 2 hours as needed when you're outdoors.   Staying in the shade or wearing long sleeves, sun glasses (UVA+UVB protection) and  wide brim hats (4-inch brim around the entire circumference of the hat) are also recommended for sun protection.

## 2023-10-07 ENCOUNTER — Encounter: Payer: Self-pay | Admitting: *Deleted

## 2023-10-10 LAB — SURGICAL PATHOLOGY

## 2023-10-11 ENCOUNTER — Encounter: Payer: Self-pay | Admitting: Dermatology

## 2023-10-11 ENCOUNTER — Ambulatory Visit: Payer: Self-pay | Admitting: Dermatology

## 2023-10-11 NOTE — Telephone Encounter (Signed)
 Left pt msg to call for bx results/sh

## 2023-10-11 NOTE — Telephone Encounter (Signed)
 Patient advised of BX results. aw

## 2023-10-11 NOTE — Telephone Encounter (Signed)
-----   Message from Alm Rhyme sent at 10/11/2023  9:29 AM EDT ----- FINAL DIAGNOSIS        1. Skin, right abdomen (side) - lower :       SQUAMOUS CELL CARCINOMA IN SITU   Cancer = SCCis Superficial Already treated Recheck next visit ----- Message ----- From: Interface, Lab In Three [facility] Seven Sent: 10/10/2023   6:43 PM EDT To: Alm JAYSON Rhyme, MD

## 2023-10-17 ENCOUNTER — Ambulatory Visit: Admitting: Anesthesiology

## 2023-10-17 ENCOUNTER — Encounter
Admission: RE | Disposition: A | Discharge status: Home / Self Care | Payer: Self-pay | Source: Home / Self Care | Attending: Gastroenterology

## 2023-10-17 ENCOUNTER — Ambulatory Visit
Admission: RE | Admit: 2023-10-17 | Discharge: 2023-10-17 | Disposition: A | Discharge status: Home / Self Care | Attending: Gastroenterology | Admitting: Gastroenterology

## 2023-10-17 DIAGNOSIS — E66813 Obesity, class 3: Secondary | ICD-10-CM | POA: Insufficient documentation

## 2023-10-17 DIAGNOSIS — G4733 Obstructive sleep apnea (adult) (pediatric): Secondary | ICD-10-CM | POA: Insufficient documentation

## 2023-10-17 DIAGNOSIS — Z1211 Encounter for screening for malignant neoplasm of colon: Secondary | ICD-10-CM | POA: Insufficient documentation

## 2023-10-17 DIAGNOSIS — Z9884 Bariatric surgery status: Secondary | ICD-10-CM | POA: Insufficient documentation

## 2023-10-17 DIAGNOSIS — K640 First degree hemorrhoids: Secondary | ICD-10-CM | POA: Diagnosis not present

## 2023-10-17 DIAGNOSIS — E785 Hyperlipidemia, unspecified: Secondary | ICD-10-CM | POA: Diagnosis not present

## 2023-10-17 DIAGNOSIS — Z6836 Body mass index (BMI) 36.0-36.9, adult: Secondary | ICD-10-CM | POA: Insufficient documentation

## 2023-10-17 DIAGNOSIS — Z87891 Personal history of nicotine dependence: Secondary | ICD-10-CM | POA: Insufficient documentation

## 2023-10-17 DIAGNOSIS — I10 Essential (primary) hypertension: Secondary | ICD-10-CM | POA: Insufficient documentation

## 2023-10-17 DIAGNOSIS — R7303 Prediabetes: Secondary | ICD-10-CM | POA: Insufficient documentation

## 2023-10-17 DIAGNOSIS — K219 Gastro-esophageal reflux disease without esophagitis: Secondary | ICD-10-CM | POA: Insufficient documentation

## 2023-10-17 DIAGNOSIS — E039 Hypothyroidism, unspecified: Secondary | ICD-10-CM | POA: Diagnosis not present

## 2023-10-17 HISTORY — PX: COLONOSCOPY: SHX5424

## 2023-10-17 SURGERY — COLONOSCOPY
Anesthesia: General

## 2023-10-17 MED ORDER — SODIUM CHLORIDE 0.9 % IV SOLN
INTRAVENOUS | Status: DC
  Administered 2023-10-17: 20 mL/h via INTRAVENOUS

## 2023-10-17 MED ORDER — PROPOFOL 10 MG/ML IV BOLUS
INTRAVENOUS | Status: DC | PRN
  Administered 2023-10-17: 100 mg via INTRAVENOUS

## 2023-10-17 MED ORDER — PROPOFOL 1000 MG/100ML IV EMUL
INTRAVENOUS | Status: AC
  Filled 2023-10-17: qty 100

## 2023-10-17 MED ORDER — PROPOFOL 500 MG/50ML IV EMUL
INTRAVENOUS | Status: DC | PRN
  Administered 2023-10-17: 150 ug/kg/min via INTRAVENOUS

## 2023-10-17 NOTE — Interval H&P Note (Signed)
 History and Physical Interval Note:  10/17/2023 10:03 AM  Dominique Kerr  has presented today for surgery, with the diagnosis of CCA SCREEN.  The various methods of treatment have been discussed with the patient and family. After consideration of risks, benefits and other options for treatment, the patient has consented to  Procedure(s): COLONOSCOPY (N/A) as a surgical intervention.  The patient's history has been reviewed, patient examined, no change in status, stable for surgery.  I have reviewed the patient's chart and labs.  Questions were answered to the patient's satisfaction.     Dominique Kerr  Ok to proceed with colonoscopy

## 2023-10-17 NOTE — Anesthesia Postprocedure Evaluation (Signed)
**Note De-Identified Dominique Obfuscation**  Anesthesia Post Note  Patient: Dominique Kerr  Procedure(s) Performed: COLONOSCOPY  Patient location during evaluation: PACU Anesthesia Type: General Level of consciousness: awake Pain management: satisfactory to patient Vital Signs Assessment: post-procedure vital signs reviewed and stable Respiratory status: spontaneous breathing and nonlabored ventilation Cardiovascular status: blood pressure returned to baseline Anesthetic complications: no   There were no known notable events for this encounter.   Last Vitals:  Vitals:   10/17/23 1043 10/17/23 1059  BP: (!) 186/82 (!) 166/86  Pulse: 85 60  Resp: 20 15  Temp:    SpO2: 98% 100%    Last Pain:  Vitals:   10/17/23 1059  TempSrc:   PainSc: 0-No pain                 VAN STAVEREN,Adeena Bernabe

## 2023-10-17 NOTE — Op Note (Signed)
 [facility] Gastroenterology Patient Name: Dominique Kerr Procedure Date: 10/17/2023 10:01 AM MRN: [id number] Account #: [id number] Date of Birth: 01-31-1961 Admit Type: Outpatient Age: 63 Room: [facility] ENDO ROOM 3 Gender: Female Note Status: Finalized Instrument Name: Colon Scope [id number] Procedure:             Colonoscopy Indications:           Screening for colorectal malignant neoplasm Providers:             Ole Schick MD, MD Referring MD:          Melanie DASEN. Valerio (Referring MD) Medicines:             Monitored Anesthesia Care Complications:         No immediate complications. Procedure:             Pre-Anesthesia Assessment:                        - Prior to the procedure, a History and Physical was                         performed, and patient medications and allergies were                         reviewed. The patient is competent. The risks and                         benefits of the procedure and the sedation options and                         risks were discussed with the patient. All questions                         were answered and informed consent was obtained.                         Patient identification and proposed procedure were                         verified by the physician, the nurse, the                         anesthesiologist, the anesthetist and the technician                         in the endoscopy suite. Mental Status Examination:                         alert and oriented. Airway Examination: normal                         oropharyngeal airway and neck mobility. Respiratory                         Examination: clear to auscultation. CV Examination:                         normal. Prophylactic Antibiotics: The patient does not  require prophylactic antibiotics. Prior                         Anticoagulants: The patient has taken no anticoagulant                         or antiplatelet agents. ASA Grade  Assessment: III - A                         patient with severe systemic disease. After reviewing                         the risks and benefits, the patient was deemed in                         satisfactory condition to undergo the procedure. The                         anesthesia plan was to use monitored anesthesia care                         (MAC). Immediately prior to administration of                         medications, the patient was re-assessed for adequacy                         to receive sedatives. The heart rate, respiratory                         rate, oxygen saturations, blood pressure, adequacy of                         pulmonary ventilation, and response to care were                         monitored throughout the procedure. The physical                         status of the patient was re-assessed after the                         procedure.                        After obtaining informed consent, the colonoscope was                         passed under direct vision. Throughout the procedure,                         the patient's blood pressure, pulse, and oxygen                         saturations were monitored continuously. The                         Colonoscope was introduced through the anus and  advanced to the the terminal ileum, with                         identification of the appendiceal orifice and IC                         valve. The colonoscopy was performed without                         difficulty. The patient tolerated the procedure well.                         The quality of the bowel preparation was good. The                         terminal ileum, ileocecal valve, appendiceal orifice,                         and rectum were photographed. Findings:      The perianal and digital rectal examinations were normal.      The terminal ileum appeared normal.      Internal hemorrhoids were found during retroflexion. The  hemorrhoids       were Grade I (internal hemorrhoids that do not prolapse).      The exam was otherwise without abnormality on direct and retroflexion       views. Impression:            - The examined portion of the ileum was normal.                        - Internal hemorrhoids.                        - The examination was otherwise normal on direct and                         retroflexion views.                        - No specimens collected. Recommendation:        - Discharge patient to home.                        - Resume previous diet.                        - Continue present medications.                        - Repeat colonoscopy in 10 years for screening                         purposes.                        - Return to referring physician as previously                         scheduled. Procedure Code(s):     --- Professional ---  H9878, Colorectal cancer screening; colonoscopy on                         individual not meeting criteria for high risk Diagnosis Code(s):     --- Professional ---                        Z12.11, Encounter for screening for malignant neoplasm                         of colon                        K64.0, First degree hemorrhoids CPT copyright 2022 American Medical Association. All rights reserved. The codes documented in this report are preliminary and upon coder review may  be revised to meet current compliance requirements. Ole Schick MD, MD 10/17/2023 10:41:40 AM Number of Addenda: 0 Note Initiated On: 10/17/2023 10:01 AM Scope Withdrawal Time: 0 hours 6 minutes 35 seconds  Total Procedure Duration: 0 hours 11 minutes 58 seconds  Estimated Blood Loss:  Estimated blood loss: none.      [facility]

## 2023-10-17 NOTE — Transfer of Care (Signed)
 Immediate Anesthesia Transfer of Care Note  Patient: Dominique Kerr [facility]  Procedure(s) Performed: COLONOSCOPY  Patient Location: PACU  Anesthesia Type:General  Level of Consciousness: awake and sedated  Airway & Oxygen Therapy: Patient Spontanous Breathing and Patient connected to nasal cannula oxygen  Post-op Assessment: Report given to RN and Post -op Vital signs reviewed and stable  Post vital signs: Reviewed and stable  Last Vitals:  Vitals Value Taken Time  BP    Temp    Pulse    Resp    SpO2      Last Pain:  Vitals:   10/17/23 0842  TempSrc: Temporal  PainSc: 0-No pain         Complications: There were no known notable events for this encounter.

## 2023-10-17 NOTE — Anesthesia Preprocedure Evaluation (Signed)
 Anesthesia Evaluation  Patient identified by MRN, date of birth, ID band Patient awake    Reviewed: Allergy & Precautions, NPO status , Patient's Chart, lab work & pertinent test results  Airway Mallampati: III  TM Distance: >3 FB Neck ROM: full    Dental  (+) Teeth Intact   Pulmonary neg pulmonary ROS, sleep apnea and Continuous Positive Airway Pressure Ventilation , former smoker   Pulmonary exam normal breath sounds clear to auscultation       Cardiovascular Exercise Tolerance: Good hypertension, Pt. on medications negative cardio ROS Normal cardiovascular exam Rhythm:Regular Rate:Normal     Neuro/Psych   Anxiety     negative neurological ROS  negative psych ROS   GI/Hepatic negative GI ROS, Neg liver ROS, hiatal hernia,GERD  Medicated,,  Endo/Other  negative endocrine ROS  Class 3 obesity  Renal/GU negative Renal ROS  negative genitourinary   Musculoskeletal   Abdominal  (+) + obese  Peds negative pediatric ROS (+)  Hematology negative hematology ROS (+)   Anesthesia Other Findings Past Medical History: No date: Allergy No date: Anemia     Comment:  YEARS AGO WHILE HAVING PERIODS No date: Anxiety No date: Arthritis No date: 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: Dysplastic nevus     Comment:  epigastric area - moderate No date: Gastric polyp No date: GERD (gastroesophageal reflux disease) No date: HSV (herpes simplex virus) infection No date: Hyperlipidemia No date: Hypertension     Comment:  H/O LOST WEIGHT AND NO MEDS X 6 YRS  No date: Hypothyroidism No date: Obesity No date: OSA on CPAP No date: PMDD (premenstrual dysphoric disorder) No date: Pre-diabetes     Comment:  PRIOR TO GASTRIC SLEEVE 2016: Sleep apnea     Comment:  9 years ago 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     Comment:  Right Abdomen (side) - Lower, EDC No date: Thyroid  disease No date: Vitamin D   deficiency  Past Surgical History: 05/09/2023: BLADDER SURGERY; Right     Comment:  rt breast stereo calcs asy x clip path pending No date: BREAST BIOPSY 05/09/2023: BREAST BIOPSY; Right     Comment:  MM RT BREAST BX W LOC DEV 1ST LESION IMAGE BX SPEC               STEREO GUIDE 05/09/2023 [facility] 05/25/2023: BREAST BIOPSY; Right     Comment:  MM RT RADIO FREQUENCY TAG LOC MAMMO GUIDE 05/25/2023               [facility] 05/25/2023: BREAST BIOPSY; Right     Comment:  MM RT RADIO FREQUENCY TAG EA ADD LESION LOC MAMMO GUIDE               05/25/2023 [facility] 06/01/2023: BREAST LUMPECTOMY WITH RADIO FREQUENCY LOCALIZER; Right     Comment:  Procedure: BREAST LUMPECTOMY WITH RADIO FREQUENCY               LOCALIZER;  Surgeon: Lane Shope, MD;  Location:               [facility] ORS;  Service: General;  Laterality: Right; No date: CHOLECYSTECTOMY No date: HERNIA REPAIR No date: KNEE ARTHROSCOPY; Left 04/09/2020: KNEE ARTHROSCOPY; Left     Comment:  Procedure: ARTHROSCOPY KNEE;  Surgeon: Mardee Lynwood SQUIBB,               MD;  Location: [facility];  Service: Orthopedics;  Laterality: Left; No date: LAPAROSCOPIC GASTRIC SLEEVE RESECTION No date: parotid gland removal No date: WISDOM TOOTH EXTRACTION; N/A  BMI    Body Mass Index: 36.64 kg/m      Reproductive/Obstetrics negative OB ROS                              Anesthesia Physical Anesthesia Plan  ASA: 3  Anesthesia Plan: General   Post-op Pain Management:    Induction: Intravenous  PONV Risk Score and Plan: Propofol  infusion and TIVA  Airway Management Planned: Natural Airway and Nasal Cannula  Additional Equipment:   Intra-op Plan:   Post-operative Plan:   Informed Consent: I have reviewed the patients History and Physical, chart, labs and discussed the procedure including the risks, benefits and alternatives for the proposed anesthesia with the patient or authorized  representative who has indicated his/her understanding and acceptance.     Dental Advisory Given  Plan Discussed with: CRNA  Anesthesia Plan Comments:         Anesthesia Quick Evaluation

## 2023-10-17 NOTE — H&P (Signed)
 Outpatient short stay form Pre-procedure 10/17/2023  Ole ONEIDA Schick, MD  Primary Physician: Valerio Melanie ONEIDA, NP  Reason for visit:  Screening  History of present illness:    63 y/o lady with history of hypothyroidism, HLD, and OSA here for screening colonoscopy. Last colonoscopy was over 10 years ago. No blood thinners except 81 mg aspirin. History of gastric sleeve and cholecystectomy. No family history of GI malignancies.    Current Facility-Administered Medications:    0.9 %  sodium chloride  infusion, , Intravenous, Continuous, Khandi Kernes, Ole ONEIDA, MD, Last Rate: 20 mL/hr at 10/17/23 0854, 20 mL/hr at 10/17/23 0854  Medications Prior to Admission  Medication Sig Dispense Refill Last Dose/Taking   aspirin EC 81 MG tablet Take 81 mg by mouth daily.    Past Week   atorvastatin  (LIPITOR) 40 MG tablet Take 1 tablet (40 mg total) by mouth daily. 90 tablet 4 10/16/2023   Azelastine HCl 137 MCG/SPRAY SOLN Place 2 sprays into both nostrils daily.   Past Week   celecoxib  (CELEBREX ) 200 MG capsule Take 1 capsule (200 mg total) by mouth daily as needed for moderate pain (pain score 4-6). 30 capsule 12 Past Week   Cholecalciferol (VITAMIN D ) 50 MCG (2000 UT) tablet Take 2,000 Units by mouth daily.   10/16/2023   diphenhydramine-acetaminophen  (TYLENOL  PM) 25-500 MG TABS tablet Take 2 tablets by mouth at bedtime as needed (sleep).   Past Week   letrozole  (FEMARA ) 2.5 MG tablet TAKE 1 TABLET BY MOUTH EVERY DAY 90 tablet 1 Past Week   levothyroxine  (SYNTHROID ) 125 MCG tablet TAKE 1 TABLET BY MOUTH EVERY DAY 90 tablet 3 10/17/2023 at  5:00 AM   Multiple Vitamin (MULTIVITAMIN WITH MINERALS) TABS tablet Take 1 tablet by mouth daily.   Past Week   NON FORMULARY Pt uses a cpap nightly   Past Week   omeprazole  (PRILOSEC) 20 MG capsule TAKE 1 CAPSULE BY MOUTH EVERY DAY 90 capsule 4 Past Week   sertraline  (ZOLOFT ) 100 MG tablet Take 1 tablet (100 mg total) by mouth daily. 90 tablet 4 Past Week      Allergies  Allergen Reactions   Lisinopril Cough     Past Medical History:  Diagnosis Date   Allergy    Anemia    YEARS AGO WHILE HAVING PERIODS   Anxiety    Arthritis    DCIS (ductal carcinoma in situ) of breast    Dysplastic nevus 06/15/2006   epigastric area - moderate   Gastric polyp    GERD (gastroesophageal reflux disease)    HSV (herpes simplex virus) infection    Hyperlipidemia    Hypertension    H/O LOST WEIGHT AND NO MEDS X 6 YRS    Hypothyroidism    Obesity    OSA on CPAP    PMDD (premenstrual dysphoric disorder)    Pre-diabetes    PRIOR TO GASTRIC SLEEVE   Sleep apnea 2016   9 years ago   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   Right Abdomen (side) - Lower, EDC   Thyroid  disease    Vitamin D  deficiency     Review of systems:  Otherwise negative.    Physical Exam  Gen: Alert, oriented. Appears stated age.  HEENT: PERRLA. Lungs: No respiratory distress CV: RRR Abd: soft, benign, no masses Ext: No edema    Planned procedures: Proceed with colonoscopy. The patient understands the nature of the planned procedure, indications, risks, alternatives and potential complications including but not limited to bleeding,  infection, perforation, damage to internal organs and possible oversedation/side effects from anesthesia. The patient agrees and gives consent to proceed.  Please refer to procedure notes for findings, recommendations and patient disposition/instructions.     Ole ONEIDA Schick, MD [facility] Gastroenterology

## 2023-11-01 ENCOUNTER — Other Ambulatory Visit: Payer: Self-pay

## 2023-11-01 DIAGNOSIS — D0511 Intraductal carcinoma in situ of right breast: Secondary | ICD-10-CM

## 2023-11-08 ENCOUNTER — Other Ambulatory Visit: Payer: Self-pay | Admitting: Nurse Practitioner

## 2023-11-09 ENCOUNTER — Telehealth: Payer: Self-pay

## 2023-11-09 NOTE — Telephone Encounter (Signed)
 Rx 08/12/23 #90 3RF- 1 year Rx Requested Prescriptions  Pending Prescriptions Disp Refills   levothyroxine  (SYNTHROID ) 125 MCG tablet [Pharmacy Med Name: LEVOTHYROXINE  125 MCG TABLET] 90 tablet 2    Sig: TAKE 1 TABLET BY MOUTH EVERY DAY     Endocrinology:  Hypothyroid Agents Passed - 11/09/2023 12:04 PM      Passed - TSH in normal range and within 360 days    TSH  Date Value Ref Range Status  08/30/2023 3.690 0.450 - 4.500 uIU/mL Final         Passed - Valid encounter within last 12 months    Recent Outpatient Visits           2 months ago Ductal carcinoma in situ (DCIS) of right breast    [facility] Valerio Melanie DASEN, NP       Future Appointments             In 5 months Hester Alm BROCKS, MD [facility]

## 2023-11-09 NOTE — Telephone Encounter (Unsigned)
 Copied from CRM [id number]. Topic: Clinical - Prescription Issue >> Nov 09, 2023  1:39 PM Dominique Kerr wrote: Reason for CRM: PT called in about her medication LEVOTHYROXINE  125 MCG TABLET] 90 tablet 2      Sig: TAKE 1 TABLET BY MOUTH EVERY DAY  Pt received a noticed it was denied pt would like ot know why

## 2023-11-10 NOTE — Telephone Encounter (Signed)
 Ok for E2C2 to review.  Please advise patient that her last prescription sent in June 2025 was sent for 90 tablets with 3 refills. She should call pharmacy and ask they fill the rx they should have on file. She will not need a refill from us  for one year.

## 2023-11-10 NOTE — Telephone Encounter (Signed)
 Patient returned call. Advised of message from [facility], patient will reach out to pharmacy.

## 2023-11-11 ENCOUNTER — Telehealth: Payer: Self-pay

## 2023-11-11 NOTE — Telephone Encounter (Signed)
 Copied from CRM #[id number]. Topic: Clinical - Medication Prior Auth >> Nov 11, 2023  8:03 AM Carlatta H wrote: Reason for CRM: The patient levothyroxine  (SYNTHROID ) 125 MCG tablet [[id number]] needs to be sent for prior authorization

## 2023-11-14 ENCOUNTER — Other Ambulatory Visit (HOSPITAL_COMMUNITY): Payer: Self-pay

## 2023-11-14 ENCOUNTER — Other Ambulatory Visit: Payer: Self-pay | Admitting: Nurse Practitioner

## 2023-11-14 NOTE — Telephone Encounter (Signed)
 Prior auth not needed for generic. Does patient need brand Synthroid ?

## 2023-11-14 NOTE — Telephone Encounter (Unsigned)
 Copied from CRM #[id number]. Topic: Clinical - Medication Refill >> Nov 14, 2023 11:05 AM Dawna HERO wrote: Medication: levothyroxine  (SYNTHROID ) 125 MCG tablet  Has the patient contacted their pharmacy? Yes, didn't get a order yet (Agent: If no, request that the patient contact the pharmacy for the refill. If patient does not wish to contact the pharmacy document the reason why and proceed with request.) (Agent: If yes, when and what did the pharmacy advise?)  This is the patient's preferred pharmacy:  [id number] [address] [facility] - [address] [place] [address] Phone: [phone] Fax: [phone]  Is this the correct pharmacy for this prescription? Yes If no, delete pharmacy and type the correct one.   Has the prescription been filled recently? No  Is the patient out of the medication? Yes  Has the patient been seen for an appointment in the last year OR does the patient have an upcoming appointment? Yes  Can we respond through MyChart? Yes  Agent: Please be advised that Rx refills may take up to 3 business days. We ask that you follow-up with your pharmacy.

## 2023-11-15 NOTE — Telephone Encounter (Signed)
 Too soon for refill, LRF 08/12/23.  Requested Prescriptions  Pending Prescriptions Disp Refills   levothyroxine  (SYNTHROID ) 125 MCG tablet 90 tablet 3    Sig: Take 1 tablet (125 mcg total) by mouth daily.     Endocrinology:  Hypothyroid Agents Passed - 11/15/2023 12:12 PM      Passed - TSH in normal range and within 360 days    TSH  Date Value Ref Range Status  08/30/2023 3.690 0.450 - 4.500 uIU/mL Final         Passed - Valid encounter within last 12 months    Recent Outpatient Visits           2 months ago Ductal carcinoma in situ (DCIS) of right breast   [facility] Valerio Melanie DASEN, NP       Future Appointments             In 5 months Hester Alm BROCKS, MD [facility]

## 2023-11-15 NOTE — Telephone Encounter (Signed)
 Followed up with patient. Verbal prescription given for the remainder of the prescription that the pharmacy did not receive.

## 2023-11-28 ENCOUNTER — Ambulatory Visit
Admission: RE | Admit: 2023-11-28 | Discharge: 2023-11-28 | Disposition: A | Source: Ambulatory Visit | Attending: Surgery | Admitting: Surgery

## 2023-11-28 ENCOUNTER — Ambulatory Visit
Admission: RE | Admit: 2023-11-28 | Discharge: 2023-11-28 | Disposition: A | Discharge status: Home / Self Care | Source: Ambulatory Visit | Attending: Surgery | Admitting: Surgery

## 2023-11-28 DIAGNOSIS — D0511 Intraductal carcinoma in situ of right breast: Secondary | ICD-10-CM | POA: Diagnosis present

## 2023-11-28 NOTE — Progress Notes (Signed)
 [facility] POST-OP OFFICE VISIT  12/06/2023  HPI: Dominique Kerr is a 63 y.o. female had surgery on June 01, 2023, s/p Scout tag bracketed right breast lumpectomy for DCIS.  Path as noted below.  She has no complaints or issues, has follow-up with both oncology and radiation oncology, having completed her radiation, and continues to take Femara /letrozole  2.5 mg daily.  She reports hot flashes, but indoors and finds it tolerable.  She has no complaints specific to breast, or breast associated issues.  Denies any lingering pain or tenderness or skin changes.  Reviewed pathology: FINAL DIAGNOSIS       1. Breast, lumpectomy, right lateral tissue :      - DUCTAL CARCINOMA IN SITU (DCIS).      - SEE CANCER SUMMARY BELOW.      - BIOPSY SITE CHANGE WITH ASSOCIATED CLIP.      - TWO BRACKETING SCOUT TAGS PRESENT.  DATE SIGNED OUT: 06/03/2023 ELECTRONIC SIGNATURE : Janel Md, Rexene , Pathologist, Electronic Signature  MICROSCOPIC DESCRIPTION 1. CANCER CASE SUMMARY: DUCTAL CARCINOMA IN SITU OF THE BREAST Standard(s): AJCC-UICC 8  SPECIMEN Procedure: Lumpectomy Specimen Laterality: Right  TUMOR Histologic Type: Ductal carcinoma in situ Size (Extent) of DCIS:  Estimated size (extent) of DCIS is at least 22 mm Nuclear Grade: Grade 2 (intermediate) Necrosis: Present, central (expansive comedo necrosis)  MARGINS Margin Status: All margins negative for DCIS Distance from DCIS to closest margin: 0.5 mm Specify closest margin: Anterior  REGIONAL LYMPH NODES Regional Lymph node Status: Not applicable (no regional lymph nodes submitted or found)  DISTANT METASTASIS Distant Site(s) Involved, if applicable (select all that apply): Not applicable    Vital signs: BP (!) 152/89   Pulse 86   Ht 5' 6 (1.676 m)   Wt 239 lb (108.4 kg)   LMP  (LMP Unknown)   SpO2 98%   BMI 38.58 kg/m    Physical Exam: Constitutional: She appears well, comfortable.  Skin: Right  lateral breast without ecchymosis, induration or edema.  Incision appears to be intact, much of the Dermabond has flaked off.  No erythema, no ecchymosis, no marked retraction.    CLINICAL DATA:  Status post RIGHT lumpectomy for ductal carcinoma in-situ with negative margins. Status post radiation. On letrozole .   EXAM: DIGITAL DIAGNOSTIC UNILATERAL RIGHT MAMMOGRAM WITH TOMOSYNTHESIS AND CAD   TECHNIQUE: Right digital diagnostic mammography and breast tomosynthesis was performed. The images were evaluated with computer-aided detection.   COMPARISON:  Previous exam(s).   ACR Breast Density Category b: There are scattered areas of fibroglandular density.   FINDINGS: There is density and architectural distortion within the RIGHT breast, consistent with postsurgical changes. These are new in comparison to prior. No suspicious mass, distortion, or microcalcifications are identified to suggest presence of malignancy.   IMPRESSION: No mammographic evidence of malignancy   RECOMMENDATION: Recommend bilateral diagnostic mammogram (with RIGHT and LEFT breast ultrasound if deemed necessary) in March 2026. Patient is due for contralateral screening at this point in time.   I have discussed the findings and recommendations with the patient. If applicable, a reminder letter will be sent to the patient regarding the next appointment.   BI-RADS CATEGORY  2: Benign.     Electronically Signed   By: Corean Salter M.D.   On: 11/28/2023 14:12  Assessment/Plan: This is a 63 y.o. female status post Scout tagged/bracketed lumpectomy right breast for DCIS.  Currently no postoperative issues or complaints.  Continuing her Femara  daily.  Patient Active  Problem List   Diagnosis Date Noted   Hiatal hernia 08/27/2023   Hepatic steatosis 08/27/2023   Ductal carcinoma in situ (DCIS) of right breast 05/17/2023   Pap smear abnormality of cervix/human papillomavirus (HPV) positive 02/14/2021    Gastric polyp 03/22/2020   GERD (gastroesophageal reflux disease) 07/13/2017   Obstructive sleep apnea of adult 07/13/2017   IFG (impaired fasting glucose) 11/25/2014   HSV (herpes simplex virus) infection 11/05/2014   Allergic rhinitis 11/05/2014   Anxiety and depression 11/05/2014   Vitamin D  deficiency 11/05/2014   HLD (hyperlipidemia) 07/23/2013   Adult hypothyroidism 07/23/2013   - We discussed the role of bilateral diagnostic mammography in March, 2026.  We will have her back at that time for clinical examination, with one of my associates.    I personally spent a total of 20 minutes in the care of the patient today including preparing to see the patient, getting/reviewing separately obtained history, performing a medically appropriate exam/evaluation, documenting clinical information in the EHR, independently interpreting results, and communicating results.   Honor Leghorn M.D., FACS 12/06/2023, 9:13 AM

## 2023-12-06 ENCOUNTER — Encounter: Payer: Self-pay | Admitting: Surgery

## 2023-12-06 ENCOUNTER — Ambulatory Visit: Admitting: Surgery

## 2023-12-06 VITALS — BP 152/89 | HR 86 | Ht 66.0 in | Wt 239.0 lb

## 2023-12-06 DIAGNOSIS — D0511 Intraductal carcinoma in situ of right breast: Secondary | ICD-10-CM | POA: Diagnosis not present

## 2023-12-06 NOTE — Patient Instructions (Addendum)
 We will see you back in 6 months. Our schedule is not yet available, so we placed you in our recall system and will send you out a letter with your appointment information.

## 2023-12-07 ENCOUNTER — Telehealth: Payer: Self-pay | Admitting: Nurse Practitioner

## 2023-12-07 DIAGNOSIS — J301 Allergic rhinitis due to pollen: Secondary | ICD-10-CM

## 2023-12-07 DIAGNOSIS — R053 Chronic cough: Secondary | ICD-10-CM

## 2023-12-07 NOTE — Telephone Encounter (Signed)
 Copied from CRM [id number]. Topic: Referral - Request for Referral >> Dec 06, 2023  1:38 PM Montie POUR wrote: Did the patient discuss referral with their provider in the last year? Yes (If No - schedule appointment) (If Yes - send message)  Appointment offered? No  Type of order/referral and detailed reason for visit: She has a chronic cough  Preference of office, provider, location:  Johnita Drucella Chinita ONEIDA MD [address], [place], [place] [address] Phone: [phone]  If referral order, have you been seen by this specialty before? Ye (If Yes, this issue or another issue? When? Where? Dr. Herminio  Can we respond through MyChart? Yes

## 2023-12-29 ENCOUNTER — Ambulatory Visit
Admission: RE | Admit: 2023-12-29 | Discharge: 2023-12-29 | Disposition: A | Discharge status: Home / Self Care | Source: Ambulatory Visit | Attending: Oncology | Admitting: Oncology

## 2023-12-29 DIAGNOSIS — D0511 Intraductal carcinoma in situ of right breast: Secondary | ICD-10-CM | POA: Diagnosis present

## 2024-01-30 ENCOUNTER — Inpatient Hospital Stay: Admitting: Oncology

## 2024-01-30 ENCOUNTER — Encounter: Payer: Self-pay | Admitting: Oncology

## 2024-01-30 VITALS — BP 145/86 | HR 73 | Temp 97.6°F | Resp 19 | Ht 66.0 in | Wt 241.3 lb

## 2024-01-30 DIAGNOSIS — Z9049 Acquired absence of other specified parts of digestive tract: Secondary | ICD-10-CM | POA: Diagnosis not present

## 2024-01-30 DIAGNOSIS — D0511 Intraductal carcinoma in situ of right breast: Secondary | ICD-10-CM | POA: Insufficient documentation

## 2024-01-30 DIAGNOSIS — E039 Hypothyroidism, unspecified: Secondary | ICD-10-CM | POA: Diagnosis not present

## 2024-01-30 DIAGNOSIS — Z833 Family history of diabetes mellitus: Secondary | ICD-10-CM | POA: Insufficient documentation

## 2024-01-30 DIAGNOSIS — Z79899 Other long term (current) drug therapy: Secondary | ICD-10-CM | POA: Insufficient documentation

## 2024-01-30 DIAGNOSIS — Z8261 Family history of arthritis: Secondary | ICD-10-CM | POA: Insufficient documentation

## 2024-01-30 DIAGNOSIS — Z7982 Long term (current) use of aspirin: Secondary | ICD-10-CM | POA: Insufficient documentation

## 2024-01-30 DIAGNOSIS — Z86000 Personal history of in-situ neoplasm of breast: Secondary | ICD-10-CM | POA: Diagnosis not present

## 2024-01-30 DIAGNOSIS — Z8489 Family history of other specified conditions: Secondary | ICD-10-CM | POA: Insufficient documentation

## 2024-01-30 DIAGNOSIS — G4733 Obstructive sleep apnea (adult) (pediatric): Secondary | ICD-10-CM | POA: Insufficient documentation

## 2024-01-30 DIAGNOSIS — Z808 Family history of malignant neoplasm of other organs or systems: Secondary | ICD-10-CM | POA: Diagnosis not present

## 2024-01-30 DIAGNOSIS — Z87891 Personal history of nicotine dependence: Secondary | ICD-10-CM | POA: Insufficient documentation

## 2024-01-30 DIAGNOSIS — Z08 Encounter for follow-up examination after completed treatment for malignant neoplasm: Secondary | ICD-10-CM | POA: Diagnosis not present

## 2024-01-30 DIAGNOSIS — Z86018 Personal history of other benign neoplasm: Secondary | ICD-10-CM | POA: Diagnosis not present

## 2024-01-30 DIAGNOSIS — Z888 Allergy status to other drugs, medicaments and biological substances status: Secondary | ICD-10-CM | POA: Insufficient documentation

## 2024-01-30 DIAGNOSIS — Z825 Family history of asthma and other chronic lower respiratory diseases: Secondary | ICD-10-CM | POA: Insufficient documentation

## 2024-01-30 DIAGNOSIS — F419 Anxiety disorder, unspecified: Secondary | ICD-10-CM | POA: Diagnosis not present

## 2024-01-30 DIAGNOSIS — E785 Hyperlipidemia, unspecified: Secondary | ICD-10-CM | POA: Diagnosis not present

## 2024-01-30 DIAGNOSIS — Z79811 Long term (current) use of aromatase inhibitors: Secondary | ICD-10-CM | POA: Diagnosis not present

## 2024-01-30 DIAGNOSIS — Z809 Family history of malignant neoplasm, unspecified: Secondary | ICD-10-CM | POA: Insufficient documentation

## 2024-01-30 NOTE — Progress Notes (Signed)
 Hematology/Oncology Consult note [facility]  [phone] [phone]  Patient Care Team: Cannady, Jolene T, NP as PCP - General (Nurse Practitioner) Georgina Shasta POUR, RN as Oncology Nurse Navigator Lenn Aran, MD as Consulting Physician (Radiation Oncology) Melanee Annah BROCKS, MD as Consulting Physician (Oncology)   Name of the patient: Dominique Kerr  [id number]  1961-02-03   Date of visit: 01/30/24  Diagnosis-right breast DCIS ER positive  Chief complaint/ Reason for visit-routine follow-up of DCIS on letrozole   Heme/Onc history: Patient is a 63 year old female diagnosed with 2.8 cm DCIS based on mammogram and biopsy in February 2025.  She underwentLumpectomy in April 2025 which showed 2.2 cm grade 2 DCIS with comedonecrosis.  Margins were close at 0.5 mm.  ER +90%.  Patient was using transdermal estrogen patch for hormone replacement therapy which she stopped after the diagnosis of DCIS    Patient completed adjuvant radiation therapy and started taking letrozole  in June 2025  Interval history-tolerating letrozole  well presently.  Denies any specific breast complaints.  She has been compliant with calcium  and vitamin D  as well.  ECOG PS- 0 Pain scale- 0   Review of systems- Review of Systems  Constitutional:  Negative for chills, fever, malaise/fatigue and weight loss.  HENT:  Negative for congestion, ear discharge and nosebleeds.   Eyes:  Negative for blurred vision.  Respiratory:  Negative for cough, hemoptysis, sputum production, shortness of breath and wheezing.   Cardiovascular:  Negative for chest pain, palpitations, orthopnea and claudication.  Gastrointestinal:  Negative for abdominal pain, blood in stool, constipation, diarrhea, heartburn, melena, nausea and vomiting.  Genitourinary:  Negative for dysuria, flank pain, frequency, hematuria and urgency.  Musculoskeletal:  Negative for back pain, joint pain and myalgias.   Skin:  Negative for rash.  Neurological:  Negative for dizziness, tingling, focal weakness, seizures, weakness and headaches.  Endo/Heme/Allergies:  Does not bruise/bleed easily.  Psychiatric/Behavioral:  Negative for depression and suicidal ideas. The patient does not have insomnia.       Allergies  Allergen Reactions   Lisinopril Cough     Past Medical History:  Diagnosis Date   Allergy    Anemia    YEARS AGO WHILE HAVING PERIODS   Anxiety    Arthritis    DCIS (ductal carcinoma in situ) of breast    Dysplastic nevus 06/15/2006   epigastric area - moderate   Gastric polyp    GERD (gastroesophageal reflux disease)    HSV (herpes simplex virus) infection    Hyperlipidemia    Hypertension    H/O LOST WEIGHT AND NO MEDS X 6 YRS    Hypothyroidism    Obesity    OSA on CPAP    PMDD (premenstrual dysphoric disorder)    Pre-diabetes    PRIOR TO GASTRIC SLEEVE   Sleep apnea 2016   9 years ago   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   Right Abdomen (side) - Lower, EDC   Thyroid  disease    Vitamin D  deficiency      Past Surgical History:  Procedure Laterality Date   BLADDER SURGERY Right 05/09/2023   rt breast stereo calcs asy x clip path pending   BREAST BIOPSY     BREAST BIOPSY Right 05/09/2023   MM RT BREAST BX W LOC DEV 1ST LESION IMAGE BX SPEC STEREO GUIDE 05/09/2023 [facility]   BREAST BIOPSY Right 05/25/2023   MM RT RADIO FREQUENCY TAG LOC MAMMO GUIDE 05/25/2023 [facility]   BREAST BIOPSY  Right 05/25/2023   MM RT RADIO FREQUENCY TAG EA ADD LESION LOC MAMMO GUIDE 05/25/2023 [facility]   BREAST LUMPECTOMY WITH RADIO FREQUENCY LOCALIZER Right 06/01/2023   Procedure: BREAST LUMPECTOMY WITH RADIO FREQUENCY LOCALIZER;  Surgeon: Lane Shope, MD;  Location: [facility];  Service: General;  Laterality: Right;   CHOLECYSTECTOMY     COLONOSCOPY N/A 10/17/2023   Procedure: COLONOSCOPY;  Surgeon: Maryruth Ole DASEN, MD;  Location: [facility];   Service: Endoscopy;  Laterality: N/A;   HERNIA REPAIR     KNEE ARTHROSCOPY Left    KNEE ARTHROSCOPY Left 04/09/2020   Procedure: ARTHROSCOPY KNEE;  Surgeon: Mardee Lynwood SQUIBB, MD;  Location: [facility];  Service: Orthopedics;  Laterality: Left;   LAPAROSCOPIC GASTRIC SLEEVE RESECTION     parotid gland removal     WISDOM TOOTH EXTRACTION N/A     Social History   Socioeconomic History   Marital status: Single    Spouse name: Not on file   Number of children: Not on file   Years of education: Not on file   Highest education level: Never attended school  Occupational History   Occupation: [employer]  Tobacco Use   Smoking status: Former    Current packs/day: 0.00    Average packs/day: 0.3 packs/day for 3.0 years (0.8 ttl pk-yrs)    Types: Cigarettes    Start date: 03/31/1997    Quit date: 03/31/2000    Years since quitting: 23.8   Smokeless tobacco: Former  Building Services Engineer status: Never Used  Substance and Sexual Activity   Alcohol use: Not Currently    Alcohol/week: 2.0 standard drinks of alcohol    Types: 1 Cans of beer, 1 Standard drinks or equivalent per week    Comment: May I have a drink one time a week if that p   Drug use: No   Sexual activity: Not Currently    Birth control/protection: Condom  Other Topics Concern   Not on file  Social History Narrative   Not on file   Social Drivers of Health   Financial Resource Strain: Low Risk  (12/09/2023)   Received from [facility]   Overall Financial Resource Strain (CARDIA)    Difficulty of Paying Living Expenses: Not hard at all  Food Insecurity: No Food Insecurity (12/09/2023)   Received from [facility]   Hunger Vital Sign    Within the past 12 months, you worried that your food would run out before you got the money to buy more.: Never true    Within the past 12 months, the food you bought just didn't last and you didn't have money to get more.: Never true   Transportation Needs: No Transportation Needs (12/09/2023)   Received from [facility] - Transportation    In the past 12 months, has lack of transportation kept you from medical appointments or from getting medications?: No    Lack of Transportation (Non-Medical): No  Physical Activity: Inactive (08/27/2023)   Exercise Vital Sign    Days of Exercise per Week: 1 day    Minutes of Exercise per Session: 0 min  Stress: No Stress Concern Present (08/27/2023)   [employer] of Occupational Health - Occupational Stress Questionnaire    Feeling of Stress: Not at all  Social Connections: Moderately Isolated (08/27/2023)   Social Connection and Isolation Panel    Frequency of Communication with Friends and Family: More than three times a week  Frequency of Social Gatherings with Friends and Family: Once a week    Attends Religious Services: 1 to 4 times per year    Active Member of [employer] or Organizations: No    Attends Engineer, Structural: Not on file    Marital Status: Never married  Intimate Partner Violence: Not At Risk (08/30/2023)   Humiliation, Afraid, Rape, and Kick questionnaire    Fear of Current or Ex-Partner: No    Emotionally Abused: No    Physically Abused: No    Sexually Abused: No    Family History  Problem Relation Age of Onset   Diabetes Mother    Hepatitis C Mother    Sleep apnea Father    Arthritis Sister    Bone cancer Paternal Uncle    Throat cancer Paternal Uncle    Diabetes Maternal Grandmother    Cancer Maternal Grandfather    Breast cancer Neg Hx      Current Outpatient Medications:    aspirin EC 81 MG tablet, Take 81 mg by mouth daily. , Disp: , Rfl:    atorvastatin  (LIPITOR) 40 MG tablet, Take 1 tablet (40 mg total) by mouth daily., Disp: 90 tablet, Rfl: 4   Calcium  Carb-Cholecalciferol (CALCIUM -VITAMIN D  PO), Take by mouth., Disp: , Rfl:    celecoxib  (CELEBREX ) 200 MG capsule, Take 1 capsule (200 mg total) by  mouth daily as needed for moderate pain (pain score 4-6)., Disp: 30 capsule, Rfl: 12   diphenhydramine-acetaminophen  (TYLENOL  PM) 25-500 MG TABS tablet, Take 2 tablets by mouth at bedtime as needed (sleep)., Disp: , Rfl:    letrozole  (FEMARA ) 2.5 MG tablet, TAKE 1 TABLET BY MOUTH EVERY DAY, Disp: 90 tablet, Rfl: 1   levothyroxine  (SYNTHROID ) 125 MCG tablet, TAKE 1 TABLET BY MOUTH EVERY DAY, Disp: 90 tablet, Rfl: 3   Loratadine 10 MG CAPS, Take 10 mg by mouth., Disp: , Rfl:    Multiple Vitamin (MULTIVITAMIN WITH MINERALS) TABS tablet, Take 1 tablet by mouth daily., Disp: , Rfl:    NON FORMULARY, Pt uses a cpap nightly, Disp: , Rfl:    pantoprazole (PROTONIX) 40 MG tablet, Take 40 mg by mouth daily., Disp: , Rfl:    sertraline  (ZOLOFT ) 100 MG tablet, Take 1 tablet (100 mg total) by mouth daily., Disp: 90 tablet, Rfl: 4   Azelastine HCl 137 MCG/SPRAY SOLN, Place 2 sprays into both nostrils daily., Disp: , Rfl:    omeprazole  (PRILOSEC) 20 MG capsule, TAKE 1 CAPSULE BY MOUTH EVERY DAY (Patient not taking: Reported on 01/30/2024), Disp: 90 capsule, Rfl: 4  Physical exam:  Vitals:   01/30/24 1417 01/30/24 1421  BP: (!) 161/79 (!) 145/86  Pulse: 73   Resp: 19   Temp: 97.6 F (36.4 C)   TempSrc: Tympanic   SpO2: 98%   Weight: 241 lb 4.8 oz (109.5 kg)   Height: 5' 6 (1.676 m)    Physical Exam Cardiovascular:     Rate and Rhythm: Normal rate and regular rhythm.     Heart sounds: Normal heart sounds.  Pulmonary:     Effort: Pulmonary effort is normal.     Breath sounds: Normal breath sounds.  Skin:    General: Skin is warm and dry.  Neurological:     Mental Status: She is alert and oriented to person, place, and time.    Breast exam was performed in seated and lying down position. Patient is status post right lumpectomy with a well-healed surgical scar. No evidence of any palpable  masses. No evidence of axillary adenopathy. No evidence of any palpable masses or lumps in the left breast. No  evidence of leftt axillary adenopathy   I have personally reviewed labs listed below:    Latest Ref Rng & Units 09/30/2023    1:43 PM  CMP  Glucose 70 - 99 mg/dL 889   BUN 6 - 20 mg/dL 16   Creatinine 9.55 - 1.00 mg/dL 9.25   Sodium 864 - 854 mmol/L 139   Potassium 3.5 - 5.1 mmol/L 3.7   Chloride 98 - 111 mmol/L 105   CO2 22 - 32 mmol/L 26   Calcium  8.9 - 10.3 mg/dL 9.0   Total Protein 6.5 - 8.1 g/dL 7.1   Total Bilirubin 0.0 - 1.2 mg/dL 0.4   Alkaline Phos 38 - 126 U/L 86   AST 15 - 41 U/L 25   ALT 0 - 44 U/L 28       Latest Ref Rng & Units 08/30/2023    8:48 AM  CBC  WBC 3.4 - 10.8 x10E3/uL 4.2   Hemoglobin 11.1 - 15.9 g/dL 88.5   Hematocrit 65.9 - 46.6 % 37.8   Platelets 150 - 450 x10E3/uL 268       Assessment and plan- Patient is a 63 y.o. female with history of right breast DCIS ER positive presently on letrozole  here for a routine follow-up  Clinically patient is doing well with no concerning signs and symptoms of recurrence based on today's exam.  She started taking letrozole  in June 2025 and will take it for 5 years.  Discussed the results of baseline bone density scan which was normal and did not show any evidence of osteopenia or osteoporosis.  She will continue to take calcium  1200 mg along with vitamin D  800 international units.  Mammograms to be coordinated by Dr. Lane.  I will see her back in 6 months no labs   Visit Diagnosis 1. Encounter for follow-up surveillance of ductal carcinoma in situ (DCIS) of breast   2. Use of letrozole  (Femara )   3. High risk medication use      Dr. Annah Skene, MD, MPH [facility] [phone] 01/30/2024 2:45 PM

## 2024-01-30 NOTE — Progress Notes (Signed)
 Patient doing well; no new or acute concerns at this time.

## 2024-02-21 ENCOUNTER — Ambulatory Visit: Payer: Managed Care, Other (non HMO) | Admitting: Adult Health

## 2024-02-25 NOTE — Patient Instructions (Signed)
 Be Involved in Caring For Your Health:  Taking Medications When medications are taken as directed, they can greatly improve your health. But if they are not taken as prescribed, they may not work. In some cases, not taking them correctly can be harmful. To help ensure your treatment remains effective and safe, understand your medications and how to take them. Bring your medications to each visit for review by your provider.  Your lab results, notes, and after visit summary will be available on My Chart. We strongly encourage you to use this feature. If lab results are abnormal the clinic will contact you with the appropriate steps. If the clinic does not contact you assume the results are satisfactory. You can always view your results on My Chart. If you have questions regarding your health or results, please contact the clinic during office hours. You can also ask questions on My Chart.  We at [facility] are grateful that you chose [place] to provide your care. We strive to provide evidence-based and compassionate care and are always looking for feedback. If you get a survey from the clinic please complete this so we can hear your opinions.  Prediabetes: Eating Plan Prediabetes is when your levels of blood sugar, also called glucose, are higher than normal. This can put you at risk for getting type 2 diabetes. When you have prediabetes, making healthy changes can help keep you from getting diabetes. This includes changes in your diet. Work with your health care provider or an expert in healthy eating called a dietitian. They can help you create a healthy eating plan. This plan can help you: Control your blood sugar levels. Improve your cholesterol levels. Manage your blood pressure. What are tips for following this plan? Reading food labels Read food labels to check the amount of fat and sugar in prepackaged foods. Avoid foods that have: Saturated fats. Trans fats. Added sugars. Check  food labels for the amount of salt (sodium). Avoid foods that have more than 300 milligrams (mg) of salt per serving. Limit your salt intake to less than 2,300 mg each day. Shopping Avoid buying pre-made and processed foods. Avoid buying drinks with added sugar. Cooking Cook with olive oil. Do not use: Butter. Lard. Ghee. Bake, broil, grill, steam, or boil foods. Avoid frying. Meal planning  Work with your dietitian to create an eating plan that's right for you. This may include tracking how many calories you take in each day. Use a food diary, notebook, or mobile app to track what you eat at each meal. Consider following a Mediterranean diet. This includes: Eating many servings of fresh fruits and vegetables each day. Eating fish at least twice a week. Eating one serving each day of whole grains, beans, nuts, and seeds. Using olive oil instead of other fats. Limiting alcohol. Limiting red meat. Using nonfat or low-fat dairy products. Consider following a plant-based diet. This means eating mostly: Vegetables and fruit. Grains. Beans. Nuts and seeds. If you have high blood pressure, you may need to limit your salt intake or follow a diet called the DASH eating plan. The DASH eating plan can help lower high blood pressure. Lifestyle Set weight loss goals with help from your health care team. Losing 7% of your body weight is a good goal for most people with prediabetes. Exercise for at least 30 minutes, 5 or more days a week. For support, think about joining a support group or talking with a mental health counselor. Take medicines only as  told. What foods are recommended? Fruits Berries. Bananas. Apples. Oranges. Grapes. Papaya. Mango. Pomegranate. Kiwi. Grapefruit. Cherries. Vegetables Lettuce. Spinach. Peas. Beets. Cauliflower. Cabbage. Broccoli. Carrots. Tomatoes. Squash. Eggplant. Herbs. Peppers. Onions. Cucumbers. Brussels sprouts. Grains Whole grains, such as whole-wheat  or whole-grain breads or pasta. Unsweetened oatmeal. Bulgur. Barley. Quinoa. Brown rice. Corn or whole-wheat flour tortillas or taco shells. Meats and other proteins Seafood. Poultry without skin. Lean cuts of pork and beef. Tofu. Eggs. Nuts. Beans. Dairy Low-fat or fat-free dairy products, such as yogurt, cottage cheese, and cheese. Beverages Water. Tea. Coffee. Sugar-free or diet soda. Seltzer water. Low-fat or nonfat milk. Milk alternatives, such as soy or almond milk. Fats and oils Olive oil. Canola oil. Sunflower oil. Grapeseed oil. Avocado. Walnuts. Sweets and desserts Sugar-free or low-fat pudding. Sugar-free or low-fat ice cream and other frozen treats. Seasonings and condiments Herbs. Salt-free spices. Mustard. Relish. Low-salt, low-sugar ketchup. Low-salt, low-sugar barbecue sauce. Low-fat or fat-free mayonnaise. The items listed above may not be all the foods and drinks you can have. Talk with a dietitian to learn more. What foods are not recommended? Fruits Fruits canned with syrup. Vegetables Canned vegetables. Frozen vegetables with butter or cream sauce. Grains Refined white flour and flour products, such as bread, pasta, snack foods, and cereals. Meats and other proteins Fatty cuts of meat. Poultry with skin. Breaded or fried meat. Processed meats. Dairy Full-fat yogurt, cheese, or milk. Beverages Sweetened drinks, such as iced tea and soda. Fats and oils Butter. Lard. Ghee. Sweets and desserts Baked goods, such as cake, cupcakes, pastries, cookies, and cheesecake. Seasonings and condiments Spice mixes with added salt. Ketchup. Barbecue sauce. Mayonnaise. The items listed above may not be all the foods and drinks you should avoid. Talk with a dietitian to learn more. Where to find more information American Diabetes Association: diabetes.org/food-nutrition This information is not intended to replace advice given to you by your health care provider. Make sure you  discuss any questions you have with your health care provider. Document Revised: 09/12/2022 Document Reviewed: 09/12/2022 Elsevier Patient Education  2024 [employer].

## 2024-02-27 NOTE — Progress Notes (Signed)
 " [facility] [address] [place]. [place] [address] [phone]       OFFICE FOLLOW UP NOTE  Ms. Barnie Cy Robert Date of Birth:  August 01, 1960 Medical Record Number:  [id number]   Reason for visit: CPAP follow-up    SUBJECTIVE:   CHIEF COMPLAINT:  Chief Complaint  Patient presents with   Sleep Apnea    RM 3 alone  Pt is well and stable, reports no OSA/CPAP concerns.     Follow-up visit:  Prior visit: 02/21/2023  Brief HPI:   Elloise Roark is a 64 y.o. female who is followed for OSA on BiPAP.  HST 11/19/2020 showed severe OSA with total AHI 63.3/h and O2 nadir of 61% for nearly 1 hour indicating nocturnal hypoxemia.  Started on AutoPap therapy but was not fully treating apnea therefore pursued titration study and responded well to BiPAP therapy of 20/16.  BiPAP initiated 07/2021.  At prior visit, BiPAP report showed excellent usage with optimal residual AHI.  Continues to tolerate BiPAP well.  ESS 5/24.    Interval history:  Continues to do well with BiPAP therapy.  Tolerating well without any difficulty.  Denies any significant daytime fatigue and sleeps well at night although some nights she feels she sleeps better than others. ESS 5/24. She has been increased right shoulder pain at night which can worsen when laying in left side, seems to relieve pain when laying on her back. Follows closely with DME [employer] and up to date on supplies.            ROS:   14 system review of systems performed and negative with exception of those listed in HPI  PMH:  Past Medical History:  Diagnosis Date   Allergy    Anemia    YEARS AGO WHILE HAVING PERIODS   Anxiety    Arthritis    DCIS (ductal carcinoma in situ) of breast    Dysplastic nevus 06/15/2006   epigastric area - moderate   Gastric polyp    GERD (gastroesophageal reflux disease)    HSV (herpes simplex virus) infection    Hyperlipidemia    Hypertension    H/O LOST WEIGHT AND  NO MEDS X 6 YRS    Hypothyroidism    Obesity    OSA on CPAP    PMDD (premenstrual dysphoric disorder)    Pre-diabetes    PRIOR TO GASTRIC SLEEVE   Sleep apnea 2016   9 years ago   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   Right Abdomen (side) - Lower, EDC   Thyroid  disease    Vitamin D  deficiency     PSH:  Past Surgical History:  Procedure Laterality Date   BLADDER SURGERY Right 05/09/2023   rt breast stereo calcs asy x clip path pending   BREAST BIOPSY     BREAST BIOPSY Right 05/09/2023   MM RT BREAST BX W LOC DEV 1ST LESION IMAGE BX SPEC STEREO GUIDE 05/09/2023 [facility]   BREAST BIOPSY Right 05/25/2023   MM RT RADIO FREQUENCY TAG LOC MAMMO GUIDE 05/25/2023 [facility]   BREAST BIOPSY Right 05/25/2023   MM RT RADIO FREQUENCY TAG EA ADD LESION LOC MAMMO GUIDE 05/25/2023 [facility]   BREAST LUMPECTOMY WITH RADIO FREQUENCY LOCALIZER Right 06/01/2023   Procedure: BREAST LUMPECTOMY WITH RADIO FREQUENCY LOCALIZER;  Surgeon: Lane Shope, MD;  Location: [facility];  Service: General;  Laterality: Right;   CHOLECYSTECTOMY     COLONOSCOPY N/A 10/17/2023   Procedure: COLONOSCOPY;  Surgeon: Maryruth,  Ole DASEN, MD;  Location: [facility];  Service: Endoscopy;  Laterality: N/A;   HERNIA REPAIR     KNEE ARTHROSCOPY Left    KNEE ARTHROSCOPY Left 04/09/2020   Procedure: ARTHROSCOPY KNEE;  Surgeon: Mardee Lynwood SQUIBB, MD;  Location: [facility];  Service: Orthopedics;  Laterality: Left;   LAPAROSCOPIC GASTRIC SLEEVE RESECTION     parotid gland removal     WISDOM TOOTH EXTRACTION N/A     Social History:  Social History   Socioeconomic History   Marital status: Single    Spouse name: Not on file   Number of children: Not on file   Years of education: Not on file   Highest education level: Never attended school  Occupational History   Occupation: [employer]  Tobacco Use   Smoking status: Former    Current packs/day: 0.00    Average packs/day: 0.3  packs/day for 3.0 years (0.8 ttl pk-yrs)    Types: Cigarettes    Start date: 03/31/1997    Quit date: 03/31/2000    Years since quitting: 23.9   Smokeless tobacco: Former  Building Services Engineer status: Never Used  Substance and Sexual Activity   Alcohol use: Not Currently    Alcohol/week: 2.0 standard drinks of alcohol    Types: 1 Cans of beer, 1 Standard drinks or equivalent per week    Comment: May I have a drink one time a week if that p   Drug use: No   Sexual activity: Not Currently    Birth control/protection: Condom  Other Topics Concern   Not on file  Social History Narrative   Not on file   Social Drivers of Health   Tobacco Use: Medium Risk (02/28/2024)   Patient History    Smoking Tobacco Use: Former    Smokeless Tobacco Use: Former    Passive Exposure: Not on Actuary Strain: Low Risk  (12/09/2023)   Received from [facility]   Overall Financial Resource Strain (CARDIA)    Difficulty of Paying Living Expenses: Not hard at all  Food Insecurity: No Food Insecurity (12/09/2023)   Received from [facility]   Epic    Within the past 12 months, you worried that your food would run out before you got the money to buy more.: Never true    Within the past 12 months, the food you bought just didn't last and you didn't have money to get more.: Never true  Transportation Needs: No Transportation Needs (12/09/2023)   Received from [facility]    In the past 12 months, has lack of transportation kept you from medical appointments or from getting medications?: No    Lack of Transportation (Non-Medical): No  Physical Activity: Inactive (08/27/2023)   Exercise Vital Sign    Days of Exercise per Week: 1 day    Minutes of Exercise per Session: 0 min  Stress: No Stress Concern Present (08/27/2023)   [employer] [facility] [employer]ational [employer] - Occupational Stress Questionnaire    Feeling of  Stress: Not at all  Social Connections: Moderately Isolated (08/27/2023)   Social Connection and Isolation Panel    Frequency of Communication with Friends and Family: More than three times a week    Frequency of Social Gatherings with Friends and Family: Once a week    Attends Religious Services: 1 to 4 times per year    Active Member of [employer] or Organizations:  No    Attends Banker Meetings: Not on file    Marital Status: Never married  Intimate Partner Violence: Not At Risk (08/30/2023)   Epic    Fear of Current or Ex-Partner: No    Emotionally Abused: No    Physically Abused: No    Sexually Abused: No  Depression (PHQ2-9): Low Risk (01/30/2024)   Depression (PHQ2-9)    PHQ-2 Score: 0  Alcohol Screen: Low Risk (08/27/2023)   Alcohol Screen    Last Alcohol Screening Score (AUDIT): 2  Housing: Low Risk  (12/09/2023)   Received from [facility]   Epic    In the last 12 months, was there a time when you were not able to pay the mortgage or rent on time?: No    In the past 12 months, how many times have you moved where you were living?: 0    At any time in the past 12 months, were you homeless or living in a shelter (including now)?: No  Utilities: Not At Risk (08/30/2023)   Epic    Threatened with loss of utilities: No  Health Literacy: Adequate Health Literacy (08/30/2023)   B1300 Health Literacy    Frequency of need for help with medical instructions: Never    Family History:  Family History  Problem Relation Age of Onset   Diabetes Mother    Hepatitis C Mother    Sleep apnea Father    Arthritis Sister    Bone cancer Paternal Uncle    Throat cancer Paternal Uncle    Diabetes Maternal Grandmother    Cancer Maternal Grandfather    Breast cancer Neg Hx     Medications:   Current Outpatient Medications on File Prior to Visit  Medication Sig Dispense Refill   aspirin EC 81 MG tablet Take 81 mg by mouth daily.      atorvastatin  (LIPITOR) 40 MG  tablet Take 1 tablet (40 mg total) by mouth daily. 90 tablet 4   Calcium  Carb-Cholecalciferol (CALCIUM -VITAMIN D  PO) Take by mouth.     celecoxib  (CELEBREX ) 200 MG capsule Take 1 capsule (200 mg total) by mouth daily as needed for moderate pain (pain score 4-6). 30 capsule 12   diphenhydramine-acetaminophen  (TYLENOL  PM) 25-500 MG TABS tablet Take 2 tablets by mouth at bedtime as needed (sleep).     letrozole  (FEMARA ) 2.5 MG tablet TAKE 1 TABLET BY MOUTH EVERY DAY 90 tablet 1   levothyroxine  (SYNTHROID ) 125 MCG tablet TAKE 1 TABLET BY MOUTH EVERY DAY 90 tablet 3   Loratadine 10 MG CAPS Take 10 mg by mouth.     Multiple Vitamin (MULTIVITAMIN WITH MINERALS) TABS tablet Take 1 tablet by mouth daily.     NON FORMULARY Pt uses a cpap nightly     sertraline  (ZOLOFT ) 100 MG tablet Take 1 tablet (100 mg total) by mouth daily. 90 tablet 4   No current facility-administered medications on file prior to visit.    Allergies:   Allergies  Allergen Reactions   Lisinopril Cough      OBJECTIVE:  Physical Exam  Vitals:   02/28/24 1347  BP: (!) 142/81  Pulse: 87  Weight: 241 lb (109.3 kg)  Height: 5' 6 (1.676 m)    Body mass index is 38.9 kg/m. No results found.  General: well developed, well nourished, very pleasant middle-age Caucasian female, seated, in no evident distress  Neurologic Exam Mental Status: Awake and fully alert. Oriented to place and time. Recent and remote  memory intact. Attention span, concentration and fund of knowledge appropriate. Mood and affect appropriate.  Cranial Nerves: Pupils equal, briskly reactive to light. Extraocular movements full without nystagmus. Visual fields full to confrontation. Hearing intact. Facial sensation intact. Face, tongue, palate moves normally and symmetrically.  Motor: Normal bulk and tone. Normal strength in all tested extremity muscles Gait and Station: Arises from chair without difficulty. Stance is normal. Gait demonstrates normal  stride length and balance without use of AD. Tandem walk and heel toe without difficulty.         ASSESSMENT/PLAN: Lyberti Thrush is a 64 y.o. year old female    OSA on BiPAP :  Compliance report shows satisfactory usage with slightly residual elevated AHI but suspect more due to sleeping in supine position due to shoulder pain.  Discussed ways to help alleviate shoulder pain in addition to avoiding supine sleep.  May need to consider adjustment of pressure settings in the future if residual AHI remains elevated Continue current pressure settings of 20/16.   Discussed continued nightly usage with ensuring greater than 4 hours nightly for optimal benefit and per insurance purposes.   Continue to follow with DME company [employer] for any needed supplies or CPAP related concerns BiPAP set up 07/2021     Follow up in 1 year (prefers in office visit) or call earlier if needed   CC:  PCP: Valerio Melanie DASEN, NP     Harlene Bogaert, AGNP-BC  [facility] [address] [place], [place] [address]  Phone [phone] Fax [phone] Note: This document was prepared with digital dictation and possible smart phrase technology. Any transcriptional errors that result from this process are unintentional.      "

## 2024-02-28 ENCOUNTER — Encounter: Payer: Self-pay | Admitting: Adult Health

## 2024-02-28 ENCOUNTER — Ambulatory Visit: Admitting: Adult Health

## 2024-02-28 VITALS — BP 142/81 | HR 87 | Ht 66.0 in | Wt 241.0 lb

## 2024-02-28 DIAGNOSIS — G4730 Sleep apnea, unspecified: Secondary | ICD-10-CM | POA: Diagnosis not present

## 2024-02-28 DIAGNOSIS — Z9989 Dependence on other enabling machines and devices: Secondary | ICD-10-CM

## 2024-02-28 NOTE — Patient Instructions (Addendum)
 Your Plan:  Continue nightly use of BiPAP for adequate sleep apnea management  Continue routine follow-up with your DME company [employer] for any needed supplies or CPAP related concerns     Follow-up in 1 year or call earlier if needed      Thank you for coming to see us  at [facility]. I hope we have been able to provide you high quality care today.  You may receive a patient satisfaction survey over the next few weeks. We would appreciate your feedback and comments so that we may continue to improve ourselves and the health of our patients.

## 2024-02-29 NOTE — Progress Notes (Signed)
 SABRA

## 2024-03-02 ENCOUNTER — Ambulatory Visit: Admitting: Nurse Practitioner

## 2024-03-02 ENCOUNTER — Telehealth: Payer: Self-pay | Admitting: Nurse Practitioner

## 2024-03-02 ENCOUNTER — Encounter: Payer: Self-pay | Admitting: Nurse Practitioner

## 2024-03-02 VITALS — BP 122/80 | HR 64 | Temp 97.1°F | Resp 17 | Ht 66.0 in | Wt 243.4 lb

## 2024-03-02 DIAGNOSIS — D0511 Intraductal carcinoma in situ of right breast: Secondary | ICD-10-CM

## 2024-03-02 DIAGNOSIS — M79641 Pain in right hand: Secondary | ICD-10-CM

## 2024-03-02 DIAGNOSIS — M25572 Pain in left ankle and joints of left foot: Secondary | ICD-10-CM

## 2024-03-02 DIAGNOSIS — E7800 Pure hypercholesterolemia, unspecified: Secondary | ICD-10-CM

## 2024-03-02 DIAGNOSIS — G4733 Obstructive sleep apnea (adult) (pediatric): Secondary | ICD-10-CM

## 2024-03-02 DIAGNOSIS — R87618 Other abnormal cytological findings on specimens from cervix uteri: Secondary | ICD-10-CM

## 2024-03-02 DIAGNOSIS — K760 Fatty (change of) liver, not elsewhere classified: Secondary | ICD-10-CM | POA: Diagnosis not present

## 2024-03-02 DIAGNOSIS — E039 Hypothyroidism, unspecified: Secondary | ICD-10-CM

## 2024-03-02 DIAGNOSIS — R7301 Impaired fasting glucose: Secondary | ICD-10-CM | POA: Diagnosis not present

## 2024-03-02 DIAGNOSIS — Z23 Encounter for immunization: Secondary | ICD-10-CM

## 2024-03-02 DIAGNOSIS — F32A Depression, unspecified: Secondary | ICD-10-CM | POA: Diagnosis not present

## 2024-03-02 DIAGNOSIS — M79642 Pain in left hand: Secondary | ICD-10-CM | POA: Insufficient documentation

## 2024-03-02 DIAGNOSIS — F419 Anxiety disorder, unspecified: Secondary | ICD-10-CM

## 2024-03-02 DIAGNOSIS — E66812 Obesity, class 2: Secondary | ICD-10-CM | POA: Diagnosis not present

## 2024-03-02 LAB — MICROALBUMIN, URINE WAIVED
Creatinine, Urine Waived: 50 mg/dL (ref 10–300)
Microalb, Ur Waived: 10 mg/L (ref 0–19)
Microalb/Creat Ratio: <30 mg/g

## 2024-03-02 LAB — BAYER DCA HB A1C WAIVED: HB A1C (BAYER DCA - WAIVED): 6.4 % — ABNORMAL HIGH (ref 4.8–5.6)

## 2024-03-02 MED ORDER — ZEPBOUND 2.5 MG/0.5ML ~~LOC~~ SOAJ: 2.5000 mg | SUBCUTANEOUS | 3 refills | Status: AC

## 2024-03-02 NOTE — Assessment & Plan Note (Addendum)
 Chronic, ongoing.  Diagnosed with severe OSA in May 2023.  Continue to use BiPAP nightly, she uses 100% of the time per report. Will work on getting Zepbound  coverage due to her moderate OSA and AHI 63.3.

## 2024-03-02 NOTE — Assessment & Plan Note (Signed)
 Has carpal tunnel, will check thyroid  labs today. Referral to ortho for further assessment and recommendations. Continue current at home regimen and stretching. R>L pain.

## 2024-03-02 NOTE — Assessment & Plan Note (Signed)
 Ongoing, taking Letrozole .  Complete radiation therapy on 07/27/23.  Continue to collaborate with oncology, recent notes reviewed.  Normal bone density 12/29/23, repeat in 2 years.

## 2024-03-02 NOTE — Telephone Encounter (Signed)
 Copied from CRM [id number]. Topic: Referral - Question >> Mar 02, 2024  1:23 PM Graeme ORN wrote: Reason for CRM: Apolinar from [facility] called. States she reached out to patient. Patient does not want to come to them. Patient would like someone from referrals to contact her because she wants to go to the same place she went in the past. Thank You

## 2024-03-02 NOTE — Assessment & Plan Note (Signed)
 Ongoing.  Recent A1c 6.3%, slight trend down.  Recommend heavy focus on diet and regular exercise. Check A1c today.  Start medications as needed.

## 2024-03-02 NOTE — Assessment & Plan Note (Signed)
Chronic, stable.  Denies SI/HI.  Continue current medication regimen and adjust as needed.  Refills sent as needed. 

## 2024-03-02 NOTE — Assessment & Plan Note (Signed)
 Chronic, ongoing.  Will continue Levothyroxine  125 MCG and adjust further as needed.  Labs today due to worsening hand numbness.

## 2024-03-02 NOTE — Assessment & Plan Note (Signed)
 Noted on CT 03/08/23, discussed with patient. Focus on healthy diet changes and regular exercise. CMP today.

## 2024-03-02 NOTE — Progress Notes (Signed)
 "  BP 122/80 (BP Location: Left Arm, Patient Position: Sitting, Cuff Size: Large)   Pulse 64   Temp (!) 97.1 F (36.2 C) (Oral)   Resp 17   Ht 5' 6 (1.676 m)   Wt 243 lb 6.4 oz (110.4 kg)   LMP  (LMP Unknown)   BMI 39.29 kg/m    Subjective:    Patient ID: Dominique Kerr, female    DOB: 20-Jul-1960, 64 y.o.   MRN: [id number]  HPI: Dominique Kerr is a 64 y.o. female  Chief Complaint  Patient presents with   Follow-up    6 month follow up. Touch base on hand pain and left leg near ankle/foot goes numb, Questions about weight loss. Wants to do Flu vaccine today but no pneumo.   BREAST CANCER: Breast cancer diagnosis 06/15/23. Radiation treatment performed, with last session on 07/27/23. Saw oncology last on 01/30/24. To take Letrozole  for 5 years, started in June 2025. Follows with GYN and history of abnormal pap, has repeat next month with them.   HAND PAIN Presented all of a sudden, got worse with increase in PPI by ENT.  She went back to lower dose. Swollen at times. Does have carpal tunnel to right side. Is right handed. Also having leg pain near left ankle and foot, goes numb. Duration: days Involved hand: bilateral R>L Mechanism of injury: unknown Location: in fingers and in mid palmar Onset: sudden Severity: 5/10 at worst, 4/10 today Quality: sharp, dull, and aching like needles Frequency: intermittent Radiation: no Aggravating factors: writing  Alleviating factors: nothing Treatments attempted: Tylenol  PM Relief with NSAIDs?: No NSAIDs Taken Weakness: yes Numbness: sometimes Redness: no Swelling:yes Bruising: no Fevers: no  Clinical coverage for weight loss GLP's   Medication being dispensed is Zepbound  2 mL/28 days. Titration doses are 2 mL/28 days.   [x]  Product being prescribed is FDA approved for the indication, age, weight (if applicable) and not does not exceed dosing limits per the Prescribing Information per the clinical conditions for use.  [x]   Patient's baseline weight measured within the last 45 days as required by provider before dispensing.  []  Patient is new to therapy and One of the following:   [x]  The beneficiary is 62 years of age or over and has ONE of the following:  [x]  A BMI greater than or equal to 30 kg/m2  []  A BMI greater than or equal to 27 kg/m2 with at least one weight-related comorbidity/risk factor/complication (i.e. hypertension, type 2 diabetes, obstructive sleep apnea, cardiovascular disease, dyslipidemia)  If patient has one weight-related comorbidity/risk factor/complication (i.e. hypertension, type 2 diabetes, obstructive sleep apnea, cardiovascular disease, dyslipidemia), please list obstructive sleep apnea Patient suffers from weight-related comorbidity/risk factor/complication obstructive sleep apnea  [x]  The beneficiary has participated in 6 months or greater of lifestyle modification and will continue while on medication including structured nutrition following provider recommend dietary modifications and physical activity, unless physical activity is not clinically appropriate at the time GLP1 therapy commences AND  [x]  The beneficiary will NOT be using the requested agent in combination with another GLP-1 receptor agonist agent AND  [x]  The beneficiary does NOT have any FDA-labeled contraindications to the requested agent, including pregnancy, lactation, history of medullary thyroid  cancer or multiple endocrine neoplasia type II.   Last BMI/Weight/Height recorded Estimated body mass index is 39.29 kg/m as calculated from the following:   Height as of this encounter: 5' 6 (1.676 m).   Weight as of this encounter: 243 lb 6.4 oz (  110.4 kg).     HYPOTHYROIDISM Takes Levothyroxine  125 MCG daily.  She would like  Thyroid  control status:stable Satisfied with current treatment? yes Medication side effects: no Medication compliance: good compliance Etiology of hypothyroidism:  Recent dose  adjustment:no Fatigue: a little bit Cold intolerance: no Heat intolerance: no Weight gain: no Weight loss: no Constipation: occasional - takes fiber Diarrhea/loose stools: no Palpitations: rare Lower extremity edema: no Anxiety/depressed mood: sometimes   Impaired Fasting Glucose Diet focused.  Severe OSA present and uses CPAP nightly. Study was on 10/09/20. HbA1C:  Lab Results  Component Value Date   HGBA1C 6.4 (H) 03/02/2024  Duration of elevated blood sugar:  Polydipsia: no Polyuria: no Weight change: no Visual disturbance: no Glucose Monitoring: no    Accucheck frequency: Not Checking    Fasting glucose:     Post prandial:  Diabetic Education: Not Completed Family history of diabetes: yes - mom, nephew, and grandmother  HYPERLIPIDEMIA Takes Atorvastatin  daily. Hyperlipidemia status: good compliance Satisfied with current treatment?  yes Side effects:  no Medication compliance: good compliance Past cholesterol meds: as above Supplements: none Aspirin:  yes The 10-year ASCVD risk score (Arnett DK, et al., 2019) is: 2.6%   Values used to calculate the score:     Age: 16 years     Clinically relevant sex: Female     Is Non-Hispanic African American: No     Diabetic: No     Tobacco smoker: No     Systolic Blood Pressure: 122 mmHg     Is BP treated: No     HDL Cholesterol: 81 mg/dL     Total Cholesterol: 194 mg/dL Chest pain:  no Coronary artery disease:  no Family history CAD:  no Family history early CAD:  no   DEPRESSION Takes Zoloft  100 MG daily.  Mood status: stable Satisfied with current treatment?: yes Symptom severity: moderate  Duration of current treatment : chronic Side effects: no Medication compliance: good compliance Psychotherapy/counseling: yes in the past Depressed mood: occasional Anxious mood: occasional Anhedonia: no Significant weight loss or gain: no Insomnia: none Fatigue: a little bit Feelings of worthlessness or guilt:  no Impaired concentration/indecisiveness: no Suicidal ideations: no Hopelessness: no Crying spells: no    03/02/2024    8:06 AM 01/30/2024    2:14 PM 09/30/2023    1:54 PM 08/30/2023    8:18 AM 05/18/2023    2:44 PM  Depression screen PHQ 2/9  Decreased Interest 0 0 0 0 0  Down, Depressed, Hopeless 1 0 0 1 0  PHQ - 2 Score 1 0 0 1 0  Altered sleeping 1   0   Tired, decreased energy 1   1   Change in appetite 1   0   Feeling bad or failure about yourself  1   0   Trouble concentrating 0   0   Moving slowly or fidgety/restless 0   0   Suicidal thoughts 0   0   PHQ-9 Score 5   2    Difficult doing work/chores Not difficult at all   Not difficult at all      Data saved with a previous flowsheet row definition       03/02/2024    8:06 AM 08/30/2023    8:18 AM 02/24/2023    8:10 AM 08/23/2022    8:14 AM  GAD 7 : Generalized Anxiety Score  Nervous, Anxious, on Edge 1 0 0 0  Control/stop worrying 0 0 1 0  Worry too much - different things 0 1 1 0  Trouble relaxing 0 0 0 0  Restless 0 0 0 0  Easily annoyed or irritable 1 0 1 0  Afraid - awful might happen 0 0 0 0  Total GAD 7 Score 2 1 3  0  Anxiety Difficulty Not difficult at all Not difficult at all Not difficult at all Not difficult at all   Relevant past medical, surgical, family and social history reviewed and updated as indicated. Interim medical history since our last visit reviewed. Allergies and medications reviewed and updated.  Review of Systems  Constitutional:  Negative for activity change, appetite change, diaphoresis, fatigue and fever.  Respiratory:  Negative for cough, chest tightness and shortness of breath.   Cardiovascular:  Negative for chest pain, palpitations and leg swelling.  Gastrointestinal: Negative.   Endocrine: Negative for cold intolerance, heat intolerance, polydipsia, polyphagia and polyuria.  Musculoskeletal:  Positive for arthralgias.  Neurological:  Positive for numbness (hands).   Psychiatric/Behavioral: Negative.      Per HPI unless specifically indicated above     Objective:    BP 122/80 (BP Location: Left Arm, Patient Position: Sitting, Cuff Size: Large)   Pulse 64   Temp (!) 97.1 F (36.2 C) (Oral)   Resp 17   Ht 5' 6 (1.676 m)   Wt 243 lb 6.4 oz (110.4 kg)   LMP  (LMP Unknown)   BMI 39.29 kg/m   Wt Readings from Last 3 Encounters:  03/02/24 243 lb 6.4 oz (110.4 kg)  02/28/24 241 lb (109.3 kg)  01/30/24 241 lb 4.8 oz (109.5 kg)    Physical Exam Vitals and nursing note reviewed.  Constitutional:      General: She is awake. She is not in acute distress.    Appearance: She is well-developed and well-groomed. She is obese. She is not ill-appearing or toxic-appearing.  HENT:     Head: Normocephalic.     Right Ear: Hearing and external ear normal.     Left Ear: Hearing and external ear normal.  Eyes:     General: Lids are normal.        Right eye: No discharge.        Left eye: No discharge.     Conjunctiva/sclera: Conjunctivae normal.     Pupils: Pupils are equal, round, and reactive to light.  Neck:     Thyroid : No thyromegaly.     Vascular: No carotid bruit.  Cardiovascular:     Rate and Rhythm: Normal rate and regular rhythm.     Heart sounds: Normal heart sounds. No murmur heard.    No gallop.  Pulmonary:     Effort: Pulmonary effort is normal. No accessory muscle usage or respiratory distress.     Breath sounds: Normal breath sounds. No decreased breath sounds, wheezing or rales.  Abdominal:     General: Bowel sounds are normal. There is no distension.     Palpations: Abdomen is soft.     Tenderness: There is no abdominal tenderness.  Musculoskeletal:     Cervical back: Normal range of motion and neck supple.     Right lower leg: No edema.     Left lower leg: No edema.  Lymphadenopathy:     Cervical: No cervical adenopathy.  Skin:    General: Skin is warm and dry.  Neurological:     Mental Status: She is alert and oriented to  person, place, and time.     Deep Tendon Reflexes:  Reflexes are normal and symmetric.     Reflex Scores:      Brachioradialis reflexes are 2+ on the right side and 2+ on the left side.      Patellar reflexes are 2+ on the right side and 2+ on the left side. Psychiatric:        Attention and Perception: Attention normal.        Mood and Affect: Mood normal.        Speech: Speech normal.        Behavior: Behavior normal. Behavior is cooperative.        Thought Content: Thought content normal.    Results for orders placed or performed in visit on 03/02/24  Bayer DCA Hb A1c Waived   Collection Time: 03/02/24  8:37 AM  Result Value Ref Range   HB A1C (BAYER DCA - WAIVED) 6.4 (H) 4.8 - 5.6 %  Microalbumin, Urine Waived   Collection Time: 03/02/24  8:37 AM  Result Value Ref Range   Microalb, Ur Waived 10 0 - 19 mg/L   Creatinine, Urine Waived 50 10 - 300 mg/dL   Microalb/Creat Ratio <30 <30 mg/g      Assessment & Plan:   Problem List Items Addressed This Visit       Respiratory   Obstructive sleep apnea of adult   Chronic, ongoing.  Diagnosed with severe OSA in May 2023.  Continue to use BiPAP nightly, she uses 100% of the time per report.        Digestive   Hepatic steatosis   Noted on CT 03/08/23, discussed with patient. Focus on healthy diet changes and regular exercise. CMP today.        Endocrine   IFG (impaired fasting glucose)   Ongoing.  Recent A1c 6.3%, slight trend down.  Recommend heavy focus on diet and regular exercise. Check A1c today.  Start medications as needed.      Relevant Orders   Bayer DCA Hb A1c Waived (Completed)   Microalbumin, Urine Waived (Completed)   Adult hypothyroidism   Chronic, ongoing.  Will continue Levothyroxine  125 MCG and adjust further as needed.  Labs today due to worsening hand numbness.      Relevant Orders   T4, free   TSH     Other   Pain in both hands   Has carpal tunnel, will check thyroid  labs today. Referral to ortho  for further assessment and recommendations. Continue current at home regimen and stretching. R>L pain.      Relevant Orders   Ambulatory referral to Orthopedic Surgery   Obesity   BMI 39.29 and she would like to lose weight. Has tried for 6 months working on diet changes and regular activity with no loss. We discussed weight loss medications like Topamax, Phentermine, GLPs, Contrave, Orlistat. She is interested in GLP1s but cost is a concern. We will work on getting coverage for Zepbound  due to her moderate OSA with AHI 63.3. Educated her on this medication and side effects.      Relevant Medications   tirzepatide  (ZEPBOUND ) 2.5 MG/0.5ML Pen   HLD (hyperlipidemia)   Chronic, ongoing, tolerating Atorvastatin  at 40 MG daily.  Continue regimen and adjust as needed. Lipid panel today.      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  Complete radiation therapy on 07/27/23.  Continue  to collaborate with oncology, recent notes reviewed.  Normal bone density 12/29/23, repeat in 2 years.      Anxiety and depression   Chronic, stable.  Denies SI/HI.  Continue current medication regimen and adjust as needed. Refills sent as needed.       Acute left ankle pain   Reports this as pain near inner ankle and foot, sometimes goes numb. ?arthritic change vs sciatic pain, although does not run down full leg. Has referral to ortho in place for further assessment and recommendations.      Relevant Orders   Ambulatory referral to Orthopedic Surgery   Other Visit Diagnoses       Flu vaccine need       Flu vaccine today, educated patient.   Relevant Orders   Flu vaccine trivalent PF, 6mos and older(Flulaval,Afluria,Fluarix,Fluzone) (Completed)        Follow up plan: Return in about 6 months (around 08/30/2024) for Annual Physical after 08/29/24.      "

## 2024-03-02 NOTE — Assessment & Plan Note (Signed)
 Reports this as pain near inner ankle and foot, sometimes goes numb. ?arthritic change vs sciatic pain, although does not run down full leg. Has referral to ortho in place for further assessment and recommendations.

## 2024-03-02 NOTE — Assessment & Plan Note (Signed)
 Chronic, ongoing, tolerating Atorvastatin at 40 MG daily.  Continue regimen and adjust as needed. Lipid panel today.

## 2024-03-02 NOTE — Assessment & Plan Note (Addendum)
 BMI 39.29 and she would like to lose weight. Has tried for 6 months working on diet changes and regular activity with no loss. We discussed weight loss medications like Topamax, Phentermine, GLPs, Contrave, Orlistat. She is interested in GLP1s but cost is a concern. We will work on getting coverage for Zepbound  due to her moderate OSA with AHI 63.3. Educated her on this medication and side effects.

## 2024-03-03 ENCOUNTER — Ambulatory Visit: Payer: Self-pay | Admitting: Nurse Practitioner

## 2024-03-03 DIAGNOSIS — E039 Hypothyroidism, unspecified: Secondary | ICD-10-CM

## 2024-03-03 LAB — COMPREHENSIVE METABOLIC PANEL WITH GFR
ALT: 22 IU/L (ref 0–32)
AST: 20 IU/L (ref 0–40)
Albumin: 4.4 g/dL (ref 3.9–4.9)
Alkaline Phosphatase: 98 IU/L (ref 49–135)
BUN/Creatinine Ratio: 20 (ref 12–28)
BUN: 14 mg/dL (ref 8–27)
Bilirubin Total: 0.3 mg/dL (ref 0.0–1.2)
CO2: 25 mmol/L (ref 20–29)
Calcium: 10 mg/dL (ref 8.7–10.3)
Chloride: 102 mmol/L (ref 96–106)
Creatinine, Ser: 0.71 mg/dL (ref 0.57–1.00)
Globulin, Total: 2.6 g/dL (ref 1.5–4.5)
Glucose: 102 mg/dL — ABNORMAL HIGH (ref 70–99)
Potassium: 4.2 mmol/L (ref 3.5–5.2)
Sodium: 141 mmol/L (ref 134–144)
Total Protein: 7 g/dL (ref 6.0–8.5)
eGFR: 97 mL/min/1.73

## 2024-03-03 LAB — TSH: TSH: 4.63 u[IU]/mL — ABNORMAL HIGH (ref 0.450–4.500)

## 2024-03-03 LAB — LIPID PANEL W/O CHOL/HDL RATIO
Cholesterol, Total: 203 mg/dL — ABNORMAL HIGH (ref 100–199)
HDL: 76 mg/dL
LDL Chol Calc (NIH): 104 mg/dL — ABNORMAL HIGH (ref 0–99)
Triglycerides: 132 mg/dL (ref 0–149)
VLDL Cholesterol Cal: 23 mg/dL (ref 5–40)

## 2024-03-03 LAB — T4, FREE: Free T4: 1.19 ng/dL (ref 0.82–1.77)

## 2024-03-03 MED ORDER — LEVOTHYROXINE SODIUM 137 MCG PO TABS: 137.0000 ug | ORAL_TABLET | Freq: Every day | ORAL | 4 refills | Status: AC

## 2024-03-03 NOTE — Progress Notes (Signed)
 Contacted via MyChart -- lab visit only in 6 weeks please  Good morning Dominique Kerr, your labs have returned: - Kidney function, creatinine and eGFR, remains normal, as is liver function, AST and ALT.  - Lipid panel shows levels not at goal, are you taking Atorvastatin  daily? If so we may want to try changing to Rosuvastatin  which may offer better control for you. Let me know. - Thyroid  labs show slight elevation in TSH and normal Free T4, since you are having some worsening carpal tunnel symptoms I recommend we try increasing your Levothyroxine  from 125 MCG to 137 MCG, I have sent this in. Then I would like to recheck levels outpatient in 6 weeks. Any questions? Keep being stellar!!  Thank you for allowing me to participate in your care.  I appreciate you. Kindest regards, Christmas Faraci

## 2024-03-05 NOTE — Progress Notes (Signed)
 Lab appt scheduled.

## 2024-03-06 MED ORDER — ROSUVASTATIN CALCIUM 40 MG PO TABS: 40.0000 mg | ORAL_TABLET | Freq: Every day | ORAL | 3 refills | Status: AC

## 2024-03-13 ENCOUNTER — Other Ambulatory Visit: Payer: Self-pay | Admitting: Nurse Practitioner

## 2024-03-29 ENCOUNTER — Encounter: Payer: Self-pay | Admitting: Radiation Oncology

## 2024-03-29 ENCOUNTER — Ambulatory Visit
Admission: RE | Admit: 2024-03-29 | Discharge: 2024-03-29 | Attending: Radiation Oncology | Admitting: Radiation Oncology

## 2024-03-29 VITALS — BP 123/78 | HR 76 | Resp 16 | Wt 242.0 lb

## 2024-03-29 DIAGNOSIS — D0511 Intraductal carcinoma in situ of right breast: Secondary | ICD-10-CM

## 2024-03-29 DIAGNOSIS — C50911 Malignant neoplasm of unspecified site of right female breast: Secondary | ICD-10-CM

## 2024-03-29 NOTE — Progress Notes (Signed)
 Radiation Oncology Follow up Note  Name: Brodi Nery [facility]   Date:   03/29/2024 MRN:  [id number] DOB: Mar 09, 1960    This 64 y.o. female presents to the clinic today for 25-month follow-up status post whole breast radiation to her right breast for ER-positive ductal carcinoma in situ.  REFERRING PROVIDER: Valerio Melanie DASEN, NP  HPI: Patient is a 64 year old female now out 7 months having completed whole breast radiation to her right breast for ER-positive ductal carcinoma in situ.  Seen today in routine follow-up she is doing well.  She specifically denies breast tenderness cough or bone pain..  She is currently on letrozole  tolerating it well without side effect.  She had mammograms back in October which I have reviewed were BI-RADS 2 benign.  She is having some hot flashes from letrozole  have suggested some vitamin EE supplements  COMPLICATIONS OF TREATMENT: none  FOLLOW UP COMPLIANCE: keeps appointments   PHYSICAL EXAM:  BP 123/78   Pulse 76   Resp 16   Wt 242 lb (109.8 kg)   LMP  (LMP Unknown)   BMI 39.06 kg/m  Lungs are clear to A&P cardiac examination essentially unremarkable with regular rate and rhythm. No dominant mass or nodularity is noted in either breast in 2 positions examined. Incision is well-healed. No axillary or supraclavicular adenopathy is appreciated. Cosmetic result is excellent.  Well-developed well-nourished patient in NAD. HEENT reveals PERLA, EOMI, discs not visualized.  Oral cavity is clear. No oral mucosal lesions are identified. Neck is clear without evidence of cervical or supraclavicular adenopathy. Lungs are clear to A&P. Cardiac examination is essentially unremarkable with regular rate and rhythm without murmur rub or thrill. Abdomen is benign with no organomegaly or masses noted. Motor sensory and DTR levels are equal and symmetric in the upper and lower extremities. Cranial nerves II through XII are grossly intact. Proprioception is intact. No peripheral  adenopathy or edema is identified. No motor or sensory levels are noted. Crude visual fields are within normal range.  RADIOLOGY RESULTS: Mammograms reviewed compatible with above-stated findings  PLAN: Present time patient is doing well with no evidence of disease now out 7 months from whole breast radiation.  I am pleased with her overall progress.  Of asked to see her back in 6 months for follow-up and then we will start once a year follow-up visits.  She continues letrozole  and I have suggested some vitamin D  supplements for her hot flashes.  Patient knows to call with any concerns.  I would like to take this opportunity to thank you for allowing me to participate in the care of your patient.SABRA Marcey Penton, MD

## 2024-03-29 NOTE — Progress Notes (Signed)
 Survivorship Care Plan visit completed.  Treatment summary reviewed and given to patient.  ASCO answers booklet reviewed and given to patient.  CARE program and Cancer Transitions discussed with patient along with other resources cancer center offers to patients and caregivers.  Patient verbalized understanding.

## 2024-04-16 ENCOUNTER — Other Ambulatory Visit

## 2024-04-18 ENCOUNTER — Ambulatory Visit: Admitting: Dermatology

## 2024-07-30 ENCOUNTER — Inpatient Hospital Stay: Admitting: Oncology

## 2024-09-03 ENCOUNTER — Encounter: Admitting: Nurse Practitioner

## 2024-09-26 ENCOUNTER — Ambulatory Visit: Admitting: Radiation Oncology

## 2025-02-28 ENCOUNTER — Ambulatory Visit: Admitting: Adult Health
